# Patient Record
Sex: Female | Born: 1965 | Hispanic: Yes | Smoking: Never smoker
Health system: Southern US, Community
[De-identification: ages and names within clinical notes are randomized; demographics above are authoritative.]

## PROBLEM LIST (undated history)

## (undated) DIAGNOSIS — D649 Anemia, unspecified: Secondary | ICD-10-CM

## (undated) DIAGNOSIS — E039 Hypothyroidism, unspecified: Secondary | ICD-10-CM

## (undated) DIAGNOSIS — R7303 Prediabetes: Secondary | ICD-10-CM

## (undated) DIAGNOSIS — I10 Essential (primary) hypertension: Secondary | ICD-10-CM

## (undated) DIAGNOSIS — F419 Anxiety disorder, unspecified: Secondary | ICD-10-CM

## (undated) DIAGNOSIS — J310 Chronic rhinitis: Secondary | ICD-10-CM

## (undated) DIAGNOSIS — L309 Dermatitis, unspecified: Secondary | ICD-10-CM

## (undated) DIAGNOSIS — J45909 Unspecified asthma, uncomplicated: Secondary | ICD-10-CM

## (undated) DIAGNOSIS — F32A Depression, unspecified: Secondary | ICD-10-CM

## (undated) DIAGNOSIS — F329 Major depressive disorder, single episode, unspecified: Secondary | ICD-10-CM

## (undated) DIAGNOSIS — K3184 Gastroparesis: Secondary | ICD-10-CM

## (undated) DIAGNOSIS — F5104 Psychophysiologic insomnia: Secondary | ICD-10-CM

## (undated) DIAGNOSIS — L409 Psoriasis, unspecified: Secondary | ICD-10-CM

## (undated) DIAGNOSIS — K219 Gastro-esophageal reflux disease without esophagitis: Secondary | ICD-10-CM

## (undated) HISTORY — DX: Anemia, unspecified: D64.9

## (undated) HISTORY — DX: Psoriasis, unspecified: L40.9

## (undated) HISTORY — DX: Chronic rhinitis: J31.0

## (undated) HISTORY — DX: Psychophysiologic insomnia: F51.04

## (undated) HISTORY — DX: Unspecified asthma, uncomplicated: J45.909

## (undated) HISTORY — DX: Gastroparesis: K31.84

## (undated) HISTORY — DX: Hypothyroidism, unspecified: E03.9

## (undated) HISTORY — PX: APPENDECTOMY: SHX54

## (undated) HISTORY — PX: EYE SURGERY: SHX253

## (undated) HISTORY — DX: Anxiety disorder, unspecified: F41.9

## (undated) HISTORY — DX: Dermatitis, unspecified: L30.9

## (undated) HISTORY — DX: Gastro-esophageal reflux disease without esophagitis: K21.9

---
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
## 2008-10-21 DIAGNOSIS — I10 Essential (primary) hypertension: Secondary | ICD-10-CM | POA: Insufficient documentation

## 2008-12-17 DIAGNOSIS — IMO0001 Reserved for inherently not codable concepts without codable children: Secondary | ICD-10-CM | POA: Insufficient documentation

## 2010-03-27 ENCOUNTER — Encounter: Admission: RE | Admit: 2010-03-27 | Discharge: 2010-03-27 | Payer: Self-pay | Admitting: Neurology

## 2011-09-02 ENCOUNTER — Encounter: Payer: Self-pay | Admitting: *Deleted

## 2011-09-02 DIAGNOSIS — F329 Major depressive disorder, single episode, unspecified: Secondary | ICD-10-CM | POA: Insufficient documentation

## 2011-09-02 DIAGNOSIS — I10 Essential (primary) hypertension: Secondary | ICD-10-CM | POA: Insufficient documentation

## 2011-09-02 DIAGNOSIS — F0390 Unspecified dementia without behavioral disturbance: Secondary | ICD-10-CM | POA: Insufficient documentation

## 2011-09-02 DIAGNOSIS — F3289 Other specified depressive episodes: Secondary | ICD-10-CM | POA: Insufficient documentation

## 2011-09-02 DIAGNOSIS — R5383 Other fatigue: Secondary | ICD-10-CM | POA: Insufficient documentation

## 2011-09-02 DIAGNOSIS — R5381 Other malaise: Secondary | ICD-10-CM | POA: Insufficient documentation

## 2011-09-02 HISTORY — DX: Depression, unspecified: F32.A

## 2011-09-02 HISTORY — DX: Essential (primary) hypertension: I10

## 2014-01-07 DIAGNOSIS — T65811A Toxic effect of latex, accidental (unintentional), initial encounter: Secondary | ICD-10-CM | POA: Insufficient documentation

## 2014-01-07 DIAGNOSIS — J310 Chronic rhinitis: Secondary | ICD-10-CM | POA: Insufficient documentation

## 2014-01-09 DIAGNOSIS — F332 Major depressive disorder, recurrent severe without psychotic features: Secondary | ICD-10-CM | POA: Insufficient documentation

## 2014-03-11 DIAGNOSIS — F411 Generalized anxiety disorder: Secondary | ICD-10-CM | POA: Insufficient documentation

## 2014-03-11 DIAGNOSIS — F331 Major depressive disorder, recurrent, moderate: Secondary | ICD-10-CM | POA: Insufficient documentation

## 2014-07-07 DIAGNOSIS — R7989 Other specified abnormal findings of blood chemistry: Secondary | ICD-10-CM | POA: Insufficient documentation

## 2014-11-25 DIAGNOSIS — M5417 Radiculopathy, lumbosacral region: Secondary | ICD-10-CM | POA: Insufficient documentation

## 2014-11-25 DIAGNOSIS — M21371 Foot drop, right foot: Secondary | ICD-10-CM | POA: Insufficient documentation

## 2015-10-25 HISTORY — PX: CHOLECYSTECTOMY: SHX55

## 2016-01-06 DIAGNOSIS — M4810 Ankylosing hyperostosis [Forestier], site unspecified: Secondary | ICD-10-CM | POA: Insufficient documentation

## 2016-01-06 DIAGNOSIS — N6019 Diffuse cystic mastopathy of unspecified breast: Secondary | ICD-10-CM | POA: Insufficient documentation

## 2016-01-06 DIAGNOSIS — K5909 Other constipation: Secondary | ICD-10-CM | POA: Insufficient documentation

## 2016-01-06 DIAGNOSIS — E538 Deficiency of other specified B group vitamins: Secondary | ICD-10-CM | POA: Insufficient documentation

## 2016-01-06 DIAGNOSIS — R42 Dizziness and giddiness: Secondary | ICD-10-CM | POA: Insufficient documentation

## 2016-01-06 DIAGNOSIS — G2581 Restless legs syndrome: Secondary | ICD-10-CM | POA: Insufficient documentation

## 2016-01-06 DIAGNOSIS — B001 Herpesviral vesicular dermatitis: Secondary | ICD-10-CM | POA: Insufficient documentation

## 2016-01-06 DIAGNOSIS — E559 Vitamin D deficiency, unspecified: Secondary | ICD-10-CM | POA: Insufficient documentation

## 2016-01-06 DIAGNOSIS — G43009 Migraine without aura, not intractable, without status migrainosus: Secondary | ICD-10-CM | POA: Insufficient documentation

## 2016-01-06 DIAGNOSIS — F410 Panic disorder [episodic paroxysmal anxiety] without agoraphobia: Secondary | ICD-10-CM | POA: Insufficient documentation

## 2016-05-13 DIAGNOSIS — Z9049 Acquired absence of other specified parts of digestive tract: Secondary | ICD-10-CM | POA: Insufficient documentation

## 2017-10-10 DIAGNOSIS — R21 Rash and other nonspecific skin eruption: Secondary | ICD-10-CM | POA: Insufficient documentation

## 2018-10-24 DIAGNOSIS — F419 Anxiety disorder, unspecified: Secondary | ICD-10-CM | POA: Insufficient documentation

## 2019-03-28 DIAGNOSIS — L501 Idiopathic urticaria: Secondary | ICD-10-CM | POA: Insufficient documentation

## 2019-09-09 ENCOUNTER — Encounter: Payer: Self-pay | Admitting: Family Medicine

## 2019-09-09 ENCOUNTER — Other Ambulatory Visit: Payer: Self-pay

## 2019-09-09 ENCOUNTER — Ambulatory Visit (INDEPENDENT_AMBULATORY_CARE_PROVIDER_SITE_OTHER): Payer: Self-pay | Admitting: Family Medicine

## 2019-09-09 VITALS — BP 132/74 | HR 74 | Ht 60.5 in | Wt 124.0 lb

## 2019-09-09 DIAGNOSIS — F329 Major depressive disorder, single episode, unspecified: Secondary | ICD-10-CM | POA: Insufficient documentation

## 2019-09-09 DIAGNOSIS — E039 Hypothyroidism, unspecified: Secondary | ICD-10-CM

## 2019-09-09 DIAGNOSIS — K3184 Gastroparesis: Secondary | ICD-10-CM | POA: Insufficient documentation

## 2019-09-09 DIAGNOSIS — F32A Depression, unspecified: Secondary | ICD-10-CM | POA: Insufficient documentation

## 2019-09-09 DIAGNOSIS — F419 Anxiety disorder, unspecified: Secondary | ICD-10-CM

## 2019-09-09 NOTE — Progress Notes (Signed)
   Subjective:    Patient ID: Meredith [name], female    DOB: 09-09-1966, 53 y.o.   MRN: [id number]   CC: Meredith [name] is a 53 year old female who presents to establish care with PCP  HPI:  76 of this encounter was spent reviewing patient's medication list on epic.  Current concerns include:  Gastroparesis Patient has recently moved from [facility] and does not have insurance, is waiting for orange card approval.  Has suffered from gastroparesis since 2006. Her main symptoms include nausea which is well controlled at the moment.  She is able to tolerate p.o. and denies weight loss, vomiting or hematemesis. Was seeing Dr. Scherry Ran (last visit was in August) in [place], but now would like to be transferred to a GI specialist in [place].   Smoking status reviewed   ROS: pertinent noted in the HPI   The new patient health history form was reviewed with the patient and updated on epic.   Objective:  BP 132/74   Pulse 74   Ht 5' 0.5" (1.537 m)   Wt 124 lb (56.2 kg)   SpO2 99%   BMI 23.82 kg/m   Vitals and nursing note reviewed  General: NAD, pleasant, able to participate in exam Cardiac: RRR, S1 S2 present. normal heart sounds, no murmurs. Respiratory: CTAB, normal effort, No wheezes, rales or rhonchi GI: Abdomen soft nontender, no organomegaly, hypoactive bowel sounds Extremities: no edema or cyanosis. Skin: warm and dry, no rashes noted Neuro: alert, no obvious focal deficits Psych: Normal affect and mood  PHQ: 2  Assessment & Plan:    Gastroparesis Pt's has longstanding gastroparesis, has seen GI specialist in August this year and her symptoms are stable. Continue anti-emetics per GI. Will await Orange card approval and then refer to GI in [place] per patients requests.   Lattie Haw, MD  [facility] PGY-1

## 2019-09-09 NOTE — Patient Instructions (Addendum)
Ms Dose,  It was lovely to meet you today, I am very pleased to be your new PCP! Today we reviewed your medication list I have updated this on our system. We also talked about your gastroparesis symptoms, it seems like the symptoms have been well controlled recently and you saw your GI doctor earlier this year in August.  Please come to see me again in the next few weeks or months and I would be happy to discuss the other medical conditions in more detail.  If in the meantime if you have any additional questions then please do not hesitate to contact our clinic.  We will forward to seeing you again.    Best wishes,  Dr. Posey Pronto

## 2019-09-09 NOTE — Assessment & Plan Note (Signed)
Pt's has longstanding gastroparesis, has seen GI specialist in August this year and her symptoms are stable. Continue anti-emetics per GI. Will await Orange card approval and then refer to GI in [place] per patients requests.

## 2019-10-15 ENCOUNTER — Other Ambulatory Visit: Payer: Self-pay

## 2019-10-15 ENCOUNTER — Ambulatory Visit (INDEPENDENT_AMBULATORY_CARE_PROVIDER_SITE_OTHER): Payer: Self-pay | Admitting: Family Medicine

## 2019-10-15 ENCOUNTER — Ambulatory Visit (HOSPITAL_COMMUNITY)
Admission: RE | Admit: 2019-10-15 | Discharge: 2019-10-15 | Disposition: A | Discharge status: Home / Self Care | Payer: Medicaid Other | Source: Ambulatory Visit | Attending: Family Medicine | Admitting: Family Medicine

## 2019-10-15 ENCOUNTER — Ambulatory Visit (HOSPITAL_COMMUNITY): Payer: Medicaid Other

## 2019-10-15 ENCOUNTER — Encounter: Payer: Self-pay | Admitting: Family Medicine

## 2019-10-15 VITALS — BP 124/78 | HR 77 | Wt 125.0 lb

## 2019-10-15 DIAGNOSIS — E039 Hypothyroidism, unspecified: Secondary | ICD-10-CM

## 2019-10-15 DIAGNOSIS — Z23 Encounter for immunization: Secondary | ICD-10-CM

## 2019-10-15 DIAGNOSIS — M549 Dorsalgia, unspecified: Secondary | ICD-10-CM

## 2019-10-15 DIAGNOSIS — G8929 Other chronic pain: Secondary | ICD-10-CM

## 2019-10-15 DIAGNOSIS — I10 Essential (primary) hypertension: Secondary | ICD-10-CM

## 2019-10-15 DIAGNOSIS — R079 Chest pain, unspecified: Secondary | ICD-10-CM | POA: Insufficient documentation

## 2019-10-15 DIAGNOSIS — K3184 Gastroparesis: Secondary | ICD-10-CM

## 2019-10-15 NOTE — Patient Instructions (Signed)
Ms Huver,  It was lovely to meet you today.  I have referred you to the GI specialist gastroparesis can have referred you to physical therapy for your back pain.  I am checking your thyroid levels today to see if they are in normal range.  I will be in touch with you regarding these results.  Please, see me in 1 month's time for a follow-up and to discuss other medical issues that we did not discuss today.  Wish you the best and happy holidays,  Dr. Posey Pronto

## 2019-10-15 NOTE — Progress Notes (Signed)
   Subjective:    Patient ID: Meredith [name], female    DOB: [birth year], 53 y.o.   MRN: [id number]   CC:  Meredith [name] is a 53 yr old female who presents today for follow up. Partner [name] was present.  HPI:  Pt had multiple issues she wanted to discuss today. I recommended in order for me to provide the best care for her, that we discussed only 2-3 problems today and she saw me a for another visit in Jan 2021. Dentist, eye doctor, wants flu shot  Gastroparesis, IBS Had had symptoms of gastroparesis for many years. Mains symptoms are nausea and vomiting sometimes few times a week. Often wakes up with nausea. Has now become afraid to eat and often feels like regurgitating her food. Denies hematemesis. Was formally dx in 2018 by Dr Delle Reining in [place]. Has taken Meclizine, Dexilant, Regland and Zofran in the past for symptoms. Unsure which medications she is taking for what. Has polypharmacy and is confused with her medications. Deniesweight loss but has significant loss of appetite.  Chronic back pain  Has had chronic back pain for 10 yrs. It is a sharp, constant,n which sometimes feels like an "electrical sensation". Located at the cervical region, radiating to arms and lower spine and has  "numbness and tingling".  No urinary/ incontinence, no change in sensation. No fevers.   Smoking status reviewed   ROS: pertinent noted in the HPI    Past medical history, surgical, family, and social history reviewed and updated in the EMR as appropriate. Reviewed problem list.   Objective:  BP 124/78   Pulse 77   Wt 125 lb (56.7 kg)   SpO2 98%   BMI 24.01 kg/m   Vitals and nursing note reviewed  General: NAD, pleasant, able to participate in exam Cardiac: RRR, S1 S2 present. normal heart sounds, no murmurs. Respiratory: CTAB, normal effort, No wheezes, rales or rhonchi GI: abdo soft, epigastric tenderness, no guarding, bowel sounds present Extremities: no edema or cyanosis. Skin: warm and  dry, no rashes noted Neuro: alert, no obvious focal deficits Psych: Normal affect and mood MSK: no c-spine tenderness. Normal flexion, extension of spine   Assessment & Plan:    Gastroparesis Likely poorly controlled gastroparesis. Pt needs medical optimization. EKG-normal QT so prescribed Zofran PRN. Ambulatory referral to GI.  Chronic back pain Likely degenerative etiology. Patient open to physical therapy so will make ambulatory referral to PT. Recommend tylenol and Kpads. If these therapies do not help, then will consider imaging.    Lattie Haw, MD  [facility] PGY-1

## 2019-10-16 ENCOUNTER — Other Ambulatory Visit: Payer: Self-pay | Admitting: Family Medicine

## 2019-10-16 ENCOUNTER — Telehealth: Payer: Self-pay | Admitting: Family Medicine

## 2019-10-16 LAB — TSH: TSH: 0.271 u[IU]/mL — ABNORMAL LOW (ref 0.450–4.500)

## 2019-10-16 MED ORDER — LEVOTHYROXINE SODIUM 125 MCG PO TABS: 125.0000 ug | ORAL_TABLET | ORAL | 3 refills | Status: DC

## 2019-10-16 MED ORDER — ONDANSETRON HCL 4 MG PO TABS: 4.0000 mg | ORAL_TABLET | Freq: Three times a day (TID) | ORAL | 0 refills | Status: DC | PRN

## 2019-10-16 NOTE — Telephone Encounter (Signed)
I have called the pt back and resolved this issue. Thanks.

## 2019-10-16 NOTE — Telephone Encounter (Signed)
Called pt to inform her of her TSH results and that dose of Synthroid needs to be changed. Also called to discuss whether partner was able to get Reglan for her from [facility]. Went to voicemail so will try again later.

## 2019-10-16 NOTE — Telephone Encounter (Signed)
Partner called back and LM on nurse line.   States that they will need a refill on levothyroxine and that pt was taken off of reglan because it cause urticaria.   To PCP. Christen Bame, CMA

## 2019-10-18 DIAGNOSIS — E039 Hypothyroidism, unspecified: Secondary | ICD-10-CM | POA: Insufficient documentation

## 2019-10-18 DIAGNOSIS — G8929 Other chronic pain: Secondary | ICD-10-CM | POA: Insufficient documentation

## 2019-10-18 DIAGNOSIS — M549 Dorsalgia, unspecified: Secondary | ICD-10-CM | POA: Insufficient documentation

## 2019-10-18 NOTE — Assessment & Plan Note (Signed)
Likely poorly controlled gastroparesis. Pt needs medical optimization. EKG-normal QT so prescribed Zofran PRN. Ambulatory referral to GI.

## 2019-10-18 NOTE — Assessment & Plan Note (Addendum)
Likely degenerative etiology. Patient open to physical therapy so will make ambulatory referral to PT. Recommend tylenol and Kpads. If these therapies do not help, then will consider imaging.

## 2019-10-21 ENCOUNTER — Telehealth: Payer: Self-pay | Admitting: Family Medicine

## 2019-10-21 ENCOUNTER — Ambulatory Visit: Payer: Medicaid Other | Attending: Internal Medicine

## 2019-10-21 ENCOUNTER — Encounter: Payer: Self-pay | Admitting: Family Medicine

## 2019-10-21 ENCOUNTER — Ambulatory Visit: Payer: Medicaid Other | Admitting: Family Medicine

## 2019-10-21 DIAGNOSIS — Z20828 Contact with and (suspected) exposure to other viral communicable diseases: Secondary | ICD-10-CM | POA: Insufficient documentation

## 2019-10-21 DIAGNOSIS — Z1384 Encounter for screening for dental disorders: Secondary | ICD-10-CM

## 2019-10-21 DIAGNOSIS — Z20822 Contact with and (suspected) exposure to covid-19: Secondary | ICD-10-CM

## 2019-10-23 LAB — NOVEL CORONAVIRUS, NAA: SARS-CoV-2, NAA: NOT DETECTED

## 2019-10-28 ENCOUNTER — Ambulatory Visit: Payer: Medicaid Other | Attending: Internal Medicine

## 2019-10-28 DIAGNOSIS — Z20822 Contact with and (suspected) exposure to covid-19: Secondary | ICD-10-CM

## 2019-10-28 DIAGNOSIS — U071 COVID-19: Secondary | ICD-10-CM | POA: Insufficient documentation

## 2019-10-29 ENCOUNTER — Other Ambulatory Visit: Payer: Self-pay

## 2019-10-29 ENCOUNTER — Telehealth (INDEPENDENT_AMBULATORY_CARE_PROVIDER_SITE_OTHER): Payer: Self-pay | Admitting: Family Medicine

## 2019-10-29 ENCOUNTER — Telehealth: Payer: Medicaid Other | Admitting: Family Medicine

## 2019-10-29 DIAGNOSIS — M797 Fibromyalgia: Secondary | ICD-10-CM | POA: Insufficient documentation

## 2019-10-29 NOTE — Assessment & Plan Note (Signed)
Patient reports history of fibromyalgia for "many years".  She says that she has muscle aches that have been worse over the past few days and she has been unable to get out of bed.  She takes over-the-counter Tylenol with little relief.  She reports that she has also tried ice packs as well as heating packs and over-the-counter gels with minimal to no relief.  Patient says that she is scheduled to meet with a physical therapist but has not yet had that appointment. -Continue over-the-counter measures at this time -Continue with heating pad and ice packs as needed -Attend physical therapy assessment and treatment. -Follow-up for further management

## 2019-10-29 NOTE — Progress Notes (Signed)
[facility] Telemedicine Visit  Patient consented to have virtual visit. Method of visit: Video  Encounter participants: Patient: Meredith [name] - located at Home  Provider: Gifford Shave - located at home    Chief Complaint: Chronic pain    HPI: Patient had in person appointment scheduled but had COVID test pending because of exposure so she canceled her in person appointment and continued with virtual.  Fibromyalgia Muscles are aching, worse for past few days. OTC tylenol with little relief. Has tried heating pads and ice packs with little relief. Has also tried OTC gels. Is scheduled for physical therapy but has participated yet.   Dermatitis  Patient reports dermatitis on both legs which she is concerned about. Says it has been there for over a year. Reports that that it has remained warm to touch. It is very difficult to see on video. Denies fever.     Patient reports she is extremely forgetful and cannot remember when she requested this appointment.  Throughout the discussion patient is tangential.  She repeatedly requests further testing regarding blood work.  She asks about her white count as well as her cholesterol.  Given multiple complaints and questions which she requests tests and lab work I scheduled a appointment for her on 11/04/2019.  ROS: per HPI  Pertinent PMHx: Chronic pain   Exam: General: Patient is resting comfortably via video chat.  She looks in no acute distress.  She appears confused at times.  She is concerned about her lower extremity "dermatitis". Respiratory: Speaking clearly, no shortness of breath MSK: Patient insistent on showing lower extremities bilaterally.  She says that her distal lower extremities are erythematous pad have dermatitis.  Very difficult to discern any rash via video chat.  Pictures grainy and not clear.  Recommend further evaluation in person   Assessment/Plan:  Fibromyalgia Patient reports history of  fibromyalgia for "many years".  She says that she has muscle aches that have been worse over the past few days and she has been unable to get out of bed.  She takes over-the-counter Tylenol with little relief.  She reports that she has also tried ice packs as well as heating packs and over-the-counter gels with minimal to no relief.  Patient says that she is scheduled to meet with a physical therapist but has not yet had that appointment. -Continue over-the-counter measures at this time -Continue with heating pad and ice packs as needed -Attend physical therapy assessment and treatment. -Follow-up for further management     Lower extremity edema Patient reports that she has had worsening lower extremity edema erythema and "dermatitis".  This is on bilateral lower extremities.  She reports that she is had issues with this for "years" but feels like it is just getting worse.  The video telemedicine visit was very blurry and difficult to see or appreciate any erythema or dermatitis in the lower extremities. -Patient has follow-up appointment scheduled on 11/04/2019 and recommend evaluation at that time. -Monitor for worsening symptoms  Health maintenance Patient is requesting routine lab work.  She wants to know her white blood cell count she also would like to know her cholesterol levels.  This require clinic visit.  She is scheduled for an appointment on 11/04/2019.

## 2019-10-30 LAB — NOVEL CORONAVIRUS, NAA: SARS-CoV-2, NAA: DETECTED — AB

## 2019-10-31 ENCOUNTER — Telehealth: Payer: Self-pay | Admitting: Nurse Practitioner

## 2019-10-31 ENCOUNTER — Ambulatory Visit: Payer: Medicaid Other | Admitting: Gastroenterology

## 2019-10-31 NOTE — Telephone Encounter (Signed)
Called to Discuss with patient about Covid symptoms and the use of bamlanivimab, a monoclonal antibody infusion for those with mild to moderate Covid symptoms and at a high risk of hospitalization.     Husband states that patient is doing well. Her symptoms started 10 days ago so she does not meet criteria for infusion.

## 2019-11-04 ENCOUNTER — Encounter: Payer: Self-pay | Admitting: Family Medicine

## 2019-11-04 ENCOUNTER — Other Ambulatory Visit: Payer: Self-pay

## 2019-11-04 ENCOUNTER — Ambulatory Visit (INDEPENDENT_AMBULATORY_CARE_PROVIDER_SITE_OTHER): Payer: Self-pay | Admitting: Family Medicine

## 2019-11-04 VITALS — BP 105/60 | HR 71 | Temp 97.3°F | Wt 124.0 lb

## 2019-11-04 DIAGNOSIS — R21 Rash and other nonspecific skin eruption: Secondary | ICD-10-CM

## 2019-11-04 DIAGNOSIS — M797 Fibromyalgia: Secondary | ICD-10-CM

## 2019-11-04 NOTE — Patient Instructions (Signed)
Meredith [name],  It was lovely to see you today.  I am sorry that you are feeling very depressed because of your severe chronic pain.  I referred you to the transition of care team who will be able to deal with a lot of your chronic concerns and put you in touch with members of the community who will be able to help further.  I will refer you to a dermatologist for the rash on your leg rashes.  Please come see me next week so that we can go through your medications in detail and discontinue the ones that you do not need. Please see the other sheet for crises hotline phone numbers. Please do not hesitate to contact me if you have any other concerns and I be happy to speak to you.  I look forward to seeing you next week  Kind regards, Dr. Posey Pronto

## 2019-11-04 NOTE — Progress Notes (Signed)
   Subjective:    Patient ID: Meredith [name], female    DOB: 20-Jan-1966, 54 y.o.   MRN: [id number]   CC:  Meredith [name] is a 54 yr old female who presents today for fibromyalgia symptoms   HPI:  Fibromyalgia and chronic pain Patient was tearful at during this encounter. Partner Meredith [name] provided assistance with her over the phone. Patient reports fibromyalgia pain is making her very depressed and she does not know what to do. She has suffered the symptoms all over body for many years and is now very frustrated. Patient recently had Covid which exacerbated her chronic pain symptoms. She is due to see physical therapy this later this week. Denies thoughts of self-harm, harm to others or suicide. However did circle #9 as "nearly every day" on PHQ-9 depression scale. When I asked her why she had circled this she reported that it was more out of desperation for help as she is worried that she has so many chronic conditions that are limiting her quality of life and that. Meredith [name] reiterated that she was not having active suicidal thoughts.   Rash At the end of the encounter patient wanted to discuss a erythematous and pruritic chronic rash present on her lower extremities and over her posterior neck. Unfortunately I was unable to review this at this time. Partner Meredith [name] asked for patient to be referred to dermatology.  Smoking status reviewed   ROS: pertinent noted in the HPI    Past medical history, surgical, family, and social history reviewed and updated in the EMR as appropriate. Reviewed problem list.   Objective:  BP 105/60   Pulse 71   Temp (!) 97.3 F (36.3 C) (Oral)   Wt 124 lb (56.2 kg)   SpO2 97%   BMI 23.82 kg/m   Vitals and nursing note reviewed  General: Tearful, tired appearing Cardiac: Warm and well-perfused Respiratory: No tachypnea Extremities: no edema or cyanosis. Skin: Approx oval shaped 12cm x 6cm, erythematous, blanching rash noted on lateral surfaces of lower  extremities. Similar smaller rash noted on left side of posterior neck.  Neuro: alert, no obvious focal deficits Psych: Good mood and affect   Assessment & Plan:    Fibromyalgia Poorly controlled chronic pain from Fibromyalgia. Considered switching to different antidepressant today, however pt has polypharmacy and she is unclear which medications she is taking for which medical condition, would rather bring pt in for medication review next week and then start new medications then. Recommended to continue with PT for now and to do gentle exercises on a daily basis which can help in fibromyalgia. Considered myositis as causes of pains so will check CK today. Precepted pt with Dr Meredith [name] and Dr Meredith [name] who recommended CCM.   Meredith Haw, MD  [facility] PGY-1

## 2019-11-05 LAB — COMPREHENSIVE METABOLIC PANEL
ALT: 33 IU/L — ABNORMAL HIGH (ref 0–32)
AST: 42 IU/L — ABNORMAL HIGH (ref 0–40)
Albumin/Globulin Ratio: 1.8 (ref 1.2–2.2)
Albumin: 4.9 g/dL (ref 3.8–4.9)
Alkaline Phosphatase: 128 IU/L — ABNORMAL HIGH (ref 39–117)
BUN/Creatinine Ratio: 19 (ref 9–23)
BUN: 10 mg/dL (ref 6–24)
Bilirubin Total: 0.7 mg/dL (ref 0.0–1.2)
CO2: 26 mmol/L (ref 20–29)
Calcium: 10.1 mg/dL (ref 8.7–10.2)
Chloride: 98 mmol/L (ref 96–106)
Creatinine, Ser: 0.53 mg/dL — ABNORMAL LOW (ref 0.57–1.00)
GFR calc Af Amer: 124 mL/min/{1.73_m2} (ref >59)
GFR calc non Af Amer: 107 mL/min/{1.73_m2} (ref >59)
Globulin, Total: 2.8 g/dL (ref 1.5–4.5)
Glucose: 132 mg/dL — ABNORMAL HIGH (ref 65–99)
Potassium: 4 mmol/L (ref 3.5–5.2)
Sodium: 138 mmol/L (ref 134–144)
Total Protein: 7.7 g/dL (ref 6.0–8.5)

## 2019-11-05 LAB — CBC
Hematocrit: 43.2 % (ref 34.0–46.6)
Hemoglobin: 14.7 g/dL (ref 11.1–15.9)
MCH: 28.2 pg (ref 26.6–33.0)
MCHC: 34 g/dL (ref 31.5–35.7)
MCV: 83 fL (ref 79–97)
Platelets: 567 10*3/uL — ABNORMAL HIGH (ref 150–450)
RBC: 5.22 x10E6/uL (ref 3.77–5.28)
RDW: 12.8 % (ref 11.7–15.4)
WBC: 8.6 10*3/uL (ref 3.4–10.8)

## 2019-11-05 LAB — CK: Total CK: 64 U/L (ref 32–182)

## 2019-11-06 ENCOUNTER — Ambulatory Visit: Payer: Self-pay

## 2019-11-06 ENCOUNTER — Encounter: Payer: Self-pay | Admitting: Family Medicine

## 2019-11-07 NOTE — Assessment & Plan Note (Addendum)
Poorly controlled chronic pain from Fibromyalgia. Considered switching to different antidepressant today, however pt has polypharmacy and she is unclear which medications she is taking for which medical condition, would rather bring pt in for medication review next week and then start new medications then. Recommended to continue with PT for now and to do gentle exercises on a daily basis which can help in fibromyalgia. Considered myositis as causes of pains so will check CK today. Precepted pt with Dr Erin Hearing and Dr Hartford Poli who recommended CCM.

## 2019-11-08 ENCOUNTER — Telehealth: Payer: Self-pay | Admitting: Family Medicine

## 2019-11-08 NOTE — Telephone Encounter (Signed)
Called pt and left VM to inform her of her lab results. She will need repeat CMP and CBC next as liver function test and platelets were elevated.

## 2019-11-08 NOTE — Chronic Care Management (AMB) (Signed)
  Chronic Care Management   Outreach Note  11/08/2019 Name: [name] MRN: [id number] DOB: 21-Apr-1966  Meredith [name] is a 54 y.o. year old female who is a primary care patient of Lattie Haw, MD. I reached out to Agnes Lawrence by phone today in response to a referral sent by Ms. Toya Smothers Mccaster's PCP, Dr. Lattie Haw     An unsuccessful telephone outreach was attempted today. The patient was referred to the case management team by for assistance with care management and care coordination.   Follow Up Plan: A HIPPA compliant phone message was left for the patient providing contact information and requesting a return call.  The care management team will reach out to the patient again over the next 7 days.  If patient returns call to provider office, please advise to call [facility]edded Care Management Care Guide Glenna Durand LPN at [phone]  Nickeah Allen, LPN Health Advisor, [facility] ??[email or web address] ??[phone]

## 2019-11-11 ENCOUNTER — Ambulatory Visit: Payer: Medicaid Other | Admitting: Family Medicine

## 2019-11-13 ENCOUNTER — Encounter: Payer: Self-pay | Admitting: Family Medicine

## 2019-11-13 ENCOUNTER — Ambulatory Visit (INDEPENDENT_AMBULATORY_CARE_PROVIDER_SITE_OTHER): Payer: Self-pay | Admitting: Family Medicine

## 2019-11-13 ENCOUNTER — Ambulatory Visit: Payer: Self-pay | Admitting: Licensed Clinical Social Worker

## 2019-11-13 ENCOUNTER — Other Ambulatory Visit: Payer: Self-pay

## 2019-11-13 VITALS — BP 124/82 | HR 78 | Wt 125.0 lb

## 2019-11-13 DIAGNOSIS — D473 Essential (hemorrhagic) thrombocythemia: Secondary | ICD-10-CM

## 2019-11-13 DIAGNOSIS — Z7189 Other specified counseling: Secondary | ICD-10-CM | POA: Insufficient documentation

## 2019-11-13 DIAGNOSIS — R899 Unspecified abnormal finding in specimens from other organs, systems and tissues: Secondary | ICD-10-CM

## 2019-11-13 DIAGNOSIS — R7989 Other specified abnormal findings of blood chemistry: Secondary | ICD-10-CM | POA: Insufficient documentation

## 2019-11-13 DIAGNOSIS — Z862 Personal history of diseases of the blood and blood-forming organs and certain disorders involving the immune mechanism: Secondary | ICD-10-CM

## 2019-11-13 DIAGNOSIS — Z114 Encounter for screening for human immunodeficiency virus [HIV]: Secondary | ICD-10-CM

## 2019-11-13 DIAGNOSIS — R945 Abnormal results of liver function studies: Secondary | ICD-10-CM

## 2019-11-13 DIAGNOSIS — D75839 Thrombocytosis, unspecified: Secondary | ICD-10-CM | POA: Insufficient documentation

## 2019-11-13 DIAGNOSIS — Z1159 Encounter for screening for other viral diseases: Secondary | ICD-10-CM

## 2019-11-13 MED ORDER — ZOLPIDEM TARTRATE ER 12.5 MG PO TBCR: 12.5000 mg | EXTENDED_RELEASE_TABLET | Freq: Every evening | ORAL | 0 refills | Status: DC | PRN

## 2019-11-13 MED ORDER — ATENOLOL 100 MG PO TABS: 100.0000 mg | ORAL_TABLET | Freq: Every day | ORAL | 0 refills | Status: DC

## 2019-11-13 NOTE — Assessment & Plan Note (Signed)
Platelets 567 on recent CBC. Pt has hx of anemia however Hb 14. May be reactive thrombocytosis. Will repeat CBC today to evaluate trend.

## 2019-11-13 NOTE — Assessment & Plan Note (Signed)
LFTs (ALP, ALT and AST) slightly elevated on recent CMP. May be due to fatty liver disease which pt as. Will repeat CMP and also routine Hep C screening today.

## 2019-11-13 NOTE — Chronic Care Management (AMB) (Signed)
    Clinical Social Work  Care Management Outreach Note  11/13/2019 Name: Meredith [name] MRN: [id number] DOB: [birth year]  Meredith [name] is a 54 y.o. year old female who is a primary care patient of Lattie Haw, MD . The Care Management team was consulted for assistance with connecting patient to [facility] that provide CBT and Resources.   LCSW reached out to Agnes Lawrence today by phone to introduce and offer Care Management support. Patient was in with PCP. Partner answered phone. Follow Up Plan: LCSW will call patient tomorrow.  Casimer Lanius, LCSW Clinical Social Worker [facility]   [phone] 2:14 PM

## 2019-11-13 NOTE — Assessment & Plan Note (Addendum)
We spoke through each medication Ms Wecker had had at home. I discontinued: potassium tablets, symbicort, zofran, hydroxyzine, doxepin, vitamin D, vitamin C and desonide cream. Pt will dispose of these medications. Explained to pt the risk of polypharmacy and also recommended she keep note of all the medications she is taking and why and the importance of this. Partner [name] is very supportive and will help her. He is usually present at most clinic visits. Unsure why she is taking Atenolol which was started by previous PCP, possibly for HTN. Pt will call and find out and let me know.   I have refilled Atenolol for now and also refilled Zolpidem 30 tablets only. Pt is worried about getting addicted to this medication and cannot sleep without it. I said this is something we can discuss at our next visit and we can do this virtually if pt prefers.

## 2019-11-13 NOTE — Patient Instructions (Signed)
Ms Wi,  It was lovely to meet you again today.Today we went over each of your medications and went over why you taking each of them. I hope your medications are more clear now. Please ask your previous PCP why you were taking atenolol and let me know. Please follow up with the transition of care team. Good luck for your appointments with the dermatologist and GI doctor. I will call you and let you know your lab results if they are abnormal.  Take care and best wishes,  Dr Posey Pronto

## 2019-11-13 NOTE — Progress Notes (Signed)
   Subjective:    Patient ID: Meredith [name], female    DOB: 04/30/1966, 55 y.o.   MRN: [id number]   CC: Meredith [name] is a 54 yr old female who presents today for a medication review. Partner Meredith [name] is also present.  HPI:  Medication review  Pt has polypharmacy.Taking many medications from other previous providers but unsure which ones she is taking and why.   Smoking status reviewed   ROS: pertinent noted in the HPI    Past medical history, surgical, family, and social history reviewed and updated in the EMR as appropriate. Reviewed problem list.   Objective:  BP 124/82   Pulse 78   Wt 125 lb (56.7 kg)   SpO2 96%   BMI 24.01 kg/m   Vitals and nursing note reviewed  General: NAD, pleasant Cardiac:  Well perfused  Respiratory: no respiratory distress Neuro: alert, no obvious focal deficits Psych: Normal affect and mood   Assessment & Plan:    Encounter for medication review and counseling We spoke through each medication Meredith [name] had had at home. I discontinued: potassium tablets, symbicort, zofran, hydroxyzine, doxepin, vitamin D, vitamin C and desonide cream. Explained to pt the risk of polypharmacy and also recommended she keep note of all the medications she is taking and why and the importance of this. Partner Meredith [name] is very supportive and will help her. He is usually present at most clinic visits. Unsure why she is taking Atenolol which was started by previous PCP, possibly for HTN. Pt will call and find out and let me know.   I have refilled Atenolol for now and also refilled Zolpidem 30 tablets only. Pt is worried about getting addicted to this medication and cannot sleep without it. I said this is something we can discuss at our next visit and we can do this virtually if pt prefers.   Abnormal LFTs LFTs (ALP, ALT and AST) slightly elevated on recent CMP. May be due to fatty liver disease which pt as. Will repeat CMP and also routine Hep C screening today.    Thrombocytosis ([facility]) Platelets 567 on recent CBC. Pt has hx of anemia however Hb 14. May be reactive thrombocytosis. Will repeat CBC today to evaluate trend.   Lattie Haw, MD  [facility] PGY-1

## 2019-11-14 ENCOUNTER — Ambulatory Visit: Payer: Medicaid Other | Admitting: Licensed Clinical Social Worker

## 2019-11-14 DIAGNOSIS — M549 Dorsalgia, unspecified: Secondary | ICD-10-CM

## 2019-11-14 DIAGNOSIS — F419 Anxiety disorder, unspecified: Secondary | ICD-10-CM

## 2019-11-14 DIAGNOSIS — G8929 Other chronic pain: Secondary | ICD-10-CM

## 2019-11-14 DIAGNOSIS — Z7189 Other specified counseling: Secondary | ICD-10-CM

## 2019-11-14 LAB — COMPREHENSIVE METABOLIC PANEL
ALT: 73 IU/L — ABNORMAL HIGH (ref 0–32)
AST: 84 IU/L — ABNORMAL HIGH (ref 0–40)
Albumin/Globulin Ratio: 1.8 (ref 1.2–2.2)
Albumin: 5.1 g/dL — ABNORMAL HIGH (ref 3.8–4.9)
Alkaline Phosphatase: 125 IU/L — ABNORMAL HIGH (ref 39–117)
BUN/Creatinine Ratio: 8 — ABNORMAL LOW (ref 9–23)
BUN: 5 mg/dL — ABNORMAL LOW (ref 6–24)
Bilirubin Total: 0.8 mg/dL (ref 0.0–1.2)
CO2: 23 mmol/L (ref 20–29)
Calcium: 10.4 mg/dL — ABNORMAL HIGH (ref 8.7–10.2)
Chloride: 98 mmol/L (ref 96–106)
Creatinine, Ser: 0.6 mg/dL (ref 0.57–1.00)
GFR calc Af Amer: 119 mL/min/{1.73_m2} (ref >59)
GFR calc non Af Amer: 103 mL/min/{1.73_m2} (ref >59)
Globulin, Total: 2.8 g/dL (ref 1.5–4.5)
Glucose: 201 mg/dL — ABNORMAL HIGH (ref 65–99)
Potassium: 4.3 mmol/L (ref 3.5–5.2)
Sodium: 141 mmol/L (ref 134–144)
Total Protein: 7.9 g/dL (ref 6.0–8.5)

## 2019-11-14 LAB — CBC
Hematocrit: 44.3 % (ref 34.0–46.6)
Hemoglobin: 14.8 g/dL (ref 11.1–15.9)
MCH: 28.1 pg (ref 26.6–33.0)
MCHC: 33.4 g/dL (ref 31.5–35.7)
MCV: 84 fL (ref 79–97)
Platelets: 401 10*3/uL (ref 150–450)
RBC: 5.27 x10E6/uL (ref 3.77–5.28)
RDW: 13.1 % (ref 11.7–15.4)
WBC: 6.5 10*3/uL (ref 3.4–10.8)

## 2019-11-14 LAB — IRON,TIBC AND FERRITIN PANEL
Ferritin: 555 ng/mL — ABNORMAL HIGH (ref 15–150)
Iron Saturation: 29 % (ref 15–55)
Iron: 86 ug/dL (ref 27–159)
Total Iron Binding Capacity: 297 ug/dL (ref 250–450)
UIBC: 211 ug/dL (ref 131–425)

## 2019-11-14 LAB — HEPATITIS C ANTIBODY: Hep C Virus Ab: <0.1 s/co ratio (ref 0.0–0.9)

## 2019-11-14 LAB — HIV ANTIBODY (ROUTINE TESTING W REFLEX): HIV Screen 4th Generation wRfx: NONREACTIVE

## 2019-11-14 NOTE — Chronic Care Management (AMB) (Signed)
Care Management   Clinical Social Work Consultation   11/14/2019 Name: Meredith [name] MRN: [id number] DOB: 1966/10/09  Meredith [name] is a 54 y.o. year old female who is a primary care patient of Lattie Haw, MD . LCSW was consulted for assistance with Mental Health Counseling and Resources.   LCSW reached out to Agnes Lawrence today by phone to introduce self, assess needs for all CCM services and provide intervention.  Assessment: Patient denies needing assistance from CCM team for ongoing support and interventions with pharmacy and RN care manager. She is connected with the MAP program for all medications, has appointments schedules with specialist, has orange card, and has applied for social security disability. Strengths:Ability for insight Active sense of humor Communication skills Motivation for treatment/growth Supportive family/friends  SDOH (Social Determinants of Health) screening performed : challenges identified: None Goals Addressed            This Visit's Progress   . Advance Directive       Current Barriers:  . Patient with Anxiety and Depression  needs assistance with advance directives . Limited education about the importance of naming a healthcare power of attorney  Clinical Social Work Clinical Goal(s):  Marland Kitchen Over the next 20 days, the patient will review mailed EMMI education on Advance Directive . Over the next 30 days, patient will verbalize basic understanding of Advanced Directives and importance of completion . Over the next 30 days, the patient will complete mailed Advance Directive packet  . Over the next 45 days, the patient will have Advance Directive notarized and provide a copy to provider office  Interventions provided by LCSW: . Mailed the patient an EMMI educational handout on Advance Directives as well as an Emergency planning/management officer . Advised patient to review information mailed by this SW . Provided education and assistance to client regarding  Advanced Directives. . A voluntary discussion about advanced care planning including explanation and discussion of advanced directives was extentively discussed with the patient and friend today.   . Explanation regarding healthcare proxy and living will was reviewed and packet with forms with expiration of how to fill them out was mailed.    Patient Self Care Activities:  . Is able to complete documentation independently . Can identify next of kin, power or attorney,  . Patient will review packet and bring back to office during next appointment  Initial goal documentation     . Connect with counseling       Current Barriers:  . Chronic Mental Health needs related to depression  . Lacks knowledge of community resource: with no insurance  Clinical Social Work [employer]):  Marland Kitchen Over the next 10 days, patient will set up counseling appointment to address needs related to ongoing therapy.  Interventions: . Patient interviewed and appropriate assessments performed:  . Provided patient with information on agencies that take [employer] (family services of the [place], [place] and Mental health associates) for long term follow up and therapy/counseling  Patient Self Care Activities:  . Ability for insight . Strong support system . Patient will contact agencies provided . Does not want to schedule F/U with LCSW she will contact LCSW if needed Initial goal documentation     Review of patient status, including review of consultants reports, relevant laboratory and other test results, and collaboration with appropriate care team members and the patient's provider was performed as part of comprehensive patient evaluation and provision of care management services.    Plan:   1. No further  follow up required: by CCM care team at this time.  2. Patient declined follow up.  She will contact CCM if needs arise  Casimer Lanius, [place] / [place]    [phone] 12:28 PM

## 2019-11-14 NOTE — Patient Instructions (Signed)
Licensed Clinical Social Worker Visit Information Meredith [name]  it was nice speaking with you. Please call me directly if you have questions [phone] Goals we discussed today:  Goals Addressed            This Visit's Progress   . Advance Directive       Current Barriers:  . Patient with Anxiety and Depression  needs assistance with advance directives . Limited education about the importance of naming a healthcare power of attorney  Clinical Social Work Clinical Goal(s):  Marland Kitchen Over the next 20 days, the patient will review mailed EMMI education on Advance Directive . Over the next 30 days, patient will verbalize basic understanding of Advanced Directives and importance of completion . Over the next 30 days, the patient will complete mailed Advance Directive packet  . Over the next 45 days, the patient will have Advance Directive notarized and provide a copy to provider office  Interventions provided by LCSW: . Mailed the patient an EMMI educational handout on Advance Directives as well as an Emergency planning/management officer . Advised patient to review information mailed by this SW . Provided education and assistance to client regarding Advanced Directives. . A voluntary discussion about advanced care planning including explanation and discussion of advanced directives was extentively discussed with the patient and friend today.   . Explanation regarding healthcare proxy and living will was reviewed and packet with forms with expiration of how to fill them out was mailed.    Patient Self Care Activities:  . Is able to complete documentation independently . Can identify next of kin, power or attorney,  . Patient will review packet and bring back to office during next appointment  Initial goal documentation      . Connect with counseling       Current Barriers:  . Chronic Mental Health needs related to depression  . Lacks knowledge of community resource: with no insurance  Clinical Social  Work [employer]):  Marland Kitchen Over the next 10 days, patient will set up counseling appointment to address needs related to ongoing therapy.  Interventions: . Patient interviewed and appropriate assessments performed:  . Provided patient with information on agencies that take [employer] (family services of the [place], [place] and Mental health associates) for long term follow up and therapy/counseling  Patient Self Care Activities:  . Ability for insight . Strong support system . Patient will contact agencies provided . Does not want to schedule F/U with LCSW she will contact LCSW if needed  Initial goal documentation        Materials provided: Yes: advance directives Meredith [name] was given information about Care Management services today including:  1. Care Management services include personalized support from designated clinical staff supervised by her physician, including individualized plan of care and coordination with other care providers 2. 24/7 contact [phone] for assistance for urgent and routine care needs. 3. Care Management services at any time by phone call to the office staff.  Patient agreed to services and verbal consent obtained for phone encounter today.    The patient verbalized understanding of instructions provided today and declined a print copy of patient instruction materials.  Follow up plan: no follow up at this time   Maurine Cane, LCSW

## 2019-11-15 ENCOUNTER — Telehealth: Payer: Self-pay

## 2019-11-15 NOTE — Telephone Encounter (Signed)
Patients husband calls nurse line stating the [facility] center she was referred to does not accept their financial letter. Please send a new referral to [facility], as they will accept patient.

## 2019-11-16 ENCOUNTER — Encounter: Payer: Self-pay | Admitting: Family Medicine

## 2019-11-18 ENCOUNTER — Other Ambulatory Visit: Payer: Self-pay | Admitting: Family Medicine

## 2019-11-18 DIAGNOSIS — R21 Rash and other nonspecific skin eruption: Secondary | ICD-10-CM

## 2019-11-19 ENCOUNTER — Other Ambulatory Visit: Payer: Self-pay | Admitting: Family Medicine

## 2019-11-20 ENCOUNTER — Encounter: Payer: Self-pay | Admitting: Family Medicine

## 2019-11-20 ENCOUNTER — Ambulatory Visit: Payer: Self-pay | Admitting: Licensed Clinical Social Worker

## 2019-11-20 NOTE — Chronic Care Management (AMB) (Signed)
    Clinical Social Work  Care Management Outreach   11/20/2019 Name: Javeyah Draine MRN: [id number] DOB: 24-May-1966  Yakeline Grajewski is a 54 y.o. year old female who is a primary care patient of Lattie Haw, MD .   LCSW reached out to Agnes Lawrence and [name] today by phone to confirm they wanted the referral to [facility] for physical therapy. Per [name] this is the location they discussed with PCP and would like LCSW to fax over the referral.  Intervention: LCSW explained [facility] as well as provided contact information and phone number [phone].  Referral faxed. Clinic will reach out for additional information if needed.  Plan: No additional follow up required by LCSW at this time.  Casimer Lanius, LCSW Clinical Social Worker [place] / [place]   [phone] 5:15 PM

## 2019-11-21 ENCOUNTER — Ambulatory Visit (INDEPENDENT_AMBULATORY_CARE_PROVIDER_SITE_OTHER): Payer: Self-pay | Admitting: Gastroenterology

## 2019-11-21 ENCOUNTER — Other Ambulatory Visit: Payer: Self-pay

## 2019-11-21 ENCOUNTER — Encounter: Payer: Self-pay | Admitting: Gastroenterology

## 2019-11-21 VITALS — BP 148/89 | HR 73 | Temp 97.5°F | Resp 17 | Wt 128.4 lb

## 2019-11-21 DIAGNOSIS — K3184 Gastroparesis: Secondary | ICD-10-CM

## 2019-11-21 DIAGNOSIS — R7989 Other specified abnormal findings of blood chemistry: Secondary | ICD-10-CM

## 2019-11-21 DIAGNOSIS — K581 Irritable bowel syndrome with constipation: Secondary | ICD-10-CM

## 2019-11-21 MED ORDER — ERYTHROMYCIN BASE 250 MG PO TABS: 250.0000 mg | ORAL_TABLET | Freq: Three times a day (TID) | ORAL | 0 refills | Status: AC

## 2019-11-21 NOTE — Progress Notes (Signed)
Meredith Darby, MD [address]  [place]  [place], [place] [address]  Main: [phone]  Fax: [phone]    Gastroenterology Consultation  Referring Provider:     Leeanne Rio, MD Primary Care Physician:  Meredith Haw, MD Primary Gastroenterologist:  Dr. Cephas [name] Reason for Consultation: Gastroparesis and chronic constipation        HPI:   Meredith [name] is a 54 y.o. female referred by Dr. Lattie Haw, MD  for consultation & management of gastroparesis and chronic constipation. Patient transferred her care from [place] [facility] Dr. Delle Reining to [facility] as she moved to [facility] with her boyfriend. Patient is diagnosed with gastroparesis after work-up of nausea, vomiting, diarrhea, gastric emptying study which revealed significantly abnormal delayed gastric emptying.  Patient was taking Reglan which resulted in twitching of her face, therefore she stopped.  She is currently on Dexilant in the morning and famotidine at bedtime.  She is still struggling with gastroparesis symptoms, nausea and vomiting despite eating small portions.  However, her weight has been stable Her hemoglobin A1c was 6.6 in 2018 Food allergy profile revealed allergy to milk, nuts, hazelnuts and eggs  She also has chronic constipation, diagnosed with irritable bowel syndrome mixed type, started on Motegrity by her previous GI.  Apparently, patient is taking motility as needed only.  She does have infrequent bowel movements associated with abdominal bloating  Elevated LFTs: Patient had secondary liver disease work-up in the past negative for viral hepatitis A, B and C, normal ANA, anti-smooth muscle and antimitochondrial antibodies.  Ultrasound Doppler negative for portal vein thrombosis.  MRI/MRCP was unremarkable except for fatty liver.  Most recently, her serum ferritin levels were 555 She moved from [place] in 2012, denies any recent travel She does not smoke or drink  alcohol  She has history of cholecystectomy in 2017  NSAIDs: None  Antiplts/Anticoagulants/Anti thrombotics: None  GI Procedures: EGD 10/12/2017 1) Duodenum, Mucosal Biopsy: - Duodenal mucosa with overall intact villous architecture. - No significant intraepithelial lymphocytosis seen  2) Gastric Mucosal Biopsy: -  Mild chronic, inactive gastritis. -  An immunohistochemical stain for H.pylori is negative with satisfactory positive control.  Colonoscopy 12/26/2017, 11/30/2017, report not available  Upper GI with small bowel follow-through 01/03/2018 FINDINGS: Mild intermittent gastroesophageal reflux. Normal primary stripping wave.   The stomach, duodenal bulb and rest of the duodenal sweep appear normal.  . Small bowel is normal in caliber. Normal fold pattern. Normal transit, with contrast reaching the colon 2 hours. Terminal ileum is not well visualized secondary to overlying bowel loops but is grossly normal.  Past Medical History:  Diagnosis Date  . Anemia   . Anxiety   . Asthma   . Chronic insomnia   . Depression   . Dermatitis   . Gastroparesis   . GERD (gastroesophageal reflux disease)   . Hypertension   . Hypothyroidism   . Rhinitis     Past Surgical History:  Procedure Laterality Date  . APPENDECTOMY    . CHOLECYSTECTOMY  2017    Current Outpatient Medications:  .  albuterol (VENTOLIN HFA) 108 (90 Base) MCG/ACT inhaler, Inhale 1 puff into the lungs every 4 (four) hours., Disp: , Rfl:  .  atenolol (TENORMIN) 100 MG tablet, Take 1 tablet by mouth once daily, Disp: , Rfl:  .  cyanocobalamin (,VITAMIN B-12,) 1000 MCG/ML injection, INJECT 1ML INTO THE MUSCLE EVERY 30 DAYS, Disp: , Rfl:  .  dexlansoprazole (DEXILANT) 60 MG capsule, Take 60  mg by mouth daily., Disp: , Rfl:  .  dicyclomine (BENTYL) 20 MG tablet, Take by mouth., Disp: , Rfl:  .  DULoxetine (CYMBALTA) 60 MG capsule, Take 60 mg by mouth 2 (two) times daily., Disp: , Rfl:  .  famotidine (PEPCID) 20 MG  tablet, Take 20 mg by mouth daily., Disp: , Rfl:  .  levothyroxine (SYNTHROID) 125 MCG tablet, Take 1 tablet (125 mcg total) by mouth every morning. 30 minutes before food, Disp: 90 tablet, Rfl: 3 .  Meclizine HCl 25 MG CHEW, Chew 25 mg by mouth 3 (three) times daily with meals., Disp: , Rfl:  .  Prucalopride Succinate (MOTEGRITY) 2 MG TABS, Take by mouth., Disp: , Rfl:  .  SYRINGE-NEEDLE, DISP, 3 ML (B-D 3CC LUER-LOK SYR 25GX1") 25G X 1" 3 ML MISC, Use 1 syringe with needle monthly with B-1 2injections, Disp: , Rfl:  .  zolpidem (AMBIEN CR) 12.5 MG CR tablet, Take 1 tablet (12.5 mg total) by mouth at bedtime as needed., Disp: 30 tablet, Rfl: 0 .  budesonide-formoterol (SYMBICORT) 160-4.5 MCG/ACT inhaler, Inhale into the lungs., Disp: , Rfl:  .  budesonide-formoterol (SYMBICORT) 80-4.5 MCG/ACT inhaler, INHALE 2 PUFFS INTO THE LUNGS 2 TIMES A DAY., Disp: , Rfl:  .  Zolpidem Tartrate 10 MG SUBL, Take by mouth., Disp: , Rfl:    No family history on file.   Social History   Tobacco Use  . Smoking status: Never Smoker  . Smokeless tobacco: Never Used  Substance Use Topics  . Alcohol use: No  . Drug use: No    Allergies as of 11/21/2019 - Review Complete 11/21/2019  Allergen Reaction Noted  . Latex Rash 10/20/2014  . Shellfish allergy Rash 05/13/2016  . Amoxicillin-pot clavulanate  09/02/2011    Review of Systems:    All systems reviewed and negative except where noted in HPI.   Physical Exam:  BP (!) 148/89 (BP Location: Left Arm, Patient Position: Sitting, Cuff Size: Large)   Pulse 73   Temp (!) 97.5 F (36.4 C)   Resp 17   Wt 128 lb 6.4 oz (58.2 kg)   BMI 24.66 kg/m  No LMP recorded. Patient is postmenopausal.  General:   Alert,  Well-developed, well-nourished, pleasant and cooperative in NAD Head:  Normocephalic and atraumatic. Eyes:  Sclera clear, no icterus.   Conjunctiva pink. Ears:  Normal auditory acuity. Nose:  No deformity, discharge, or lesions. Mouth:  No  deformity or lesions,oropharynx pink & moist. Neck:  Supple; no masses or thyromegaly. Lungs:  Respirations even and unlabored.  Clear throughout to auscultation.   No wheezes, crackles, or rhonchi. No acute distress. Heart:  Regular rate and rhythm; no murmurs, clicks, rubs, or gallops. Abdomen:  Normal bowel sounds. Soft, non-tender and non-distended without masses, hepatosplenomegaly or hernias noted.  No guarding or rebound tenderness.   Rectal: Not performed Msk:  Symmetrical without gross deformities. Good, equal movement & strength bilaterally. Pulses:  Normal pulses noted. Extremities:  No clubbing or edema.  No cyanosis. Neurologic:  Alert and oriented x3;  grossly normal neurologically. Skin:  Intact without significant lesions or rashes. No jaundice. Psych:  Alert and cooperative. Normal mood and affect.  Imaging Studies: Reviewed  Assessment and Plan:   Meredith [name] is a 54 y.o. Filipino female with prediabetes is seen in consultation for gastroparesis and chronic constipation  Gastroparesis Continue Dexilant and Pepcid Discussed about gastroparesis diet, information provided Patient did not tolerate Reglan in the past which has resulted in  twitching of her face We will try 2 weeks course of erythromycin 250 mg 3 times daily before meals Check hemoglobin A1c, H. pylori IgG  Chronic constipation Advised patient to take Motegrity 2 mg once daily in the morning  Abnormal LFTs, history of fatty liver based on imaging Work-up has been unrevealing thus far except for fatty liver Elevated serum ferritin levels, rule out hereditary hemochromatosis Recommend liver biopsy if hereditary hemochromatosis is ruled out   Follow up in 4 weeks   Meredith Darby, MD

## 2019-11-22 ENCOUNTER — Encounter: Payer: Self-pay | Admitting: Family Medicine

## 2019-11-25 NOTE — Progress Notes (Signed)
[facility] [address] [place] Phone: [phone] Subjective:   I Kandace Blitz am serving as a Education administrator for Dr. Hulan Saas.  This visit occurred during the SARS-CoV-2 public health emergency.  Safety protocols were in place, including screening questions prior to the visit, additional usage of staff PPE, and extensive cleaning of exam room while observing appropriate contact time as indicated for disinfecting solutions.    I'm seeing this patient by the request  of:  Lattie Haw, MD  CC: Neck and arm pain  [id number]  Rodnisha Schaadt is a 54 y.o. female coming in with complaint of left arm, neck and back pain. Tingling bilaterally. Sharp pains in the hand. Has been told she has osteophytes in the neck. Neck cracks and pops a lot.   Onset- Chronic  Location - bilateral neck an arm, lower back Duration-  Character- achy, stabbing Aggravating factors- sitting for long periods of time, laying down  Reliving factors-  Therapies tried- tylenol   Severity- 8/10 at its worse   Reviewed patient's chart.  Patient did have an MRI from 2011 that was independently visualized by me patient did have a moderate sized disc protrusion at T1-2 and mild bulging at C6-7 with no significant foraminal stenosis  Past Medical History:  Diagnosis Date  . Anemia   . Anxiety   . Asthma   . Chronic insomnia   . Depression   . Dermatitis   . Gastroparesis   . GERD (gastroesophageal reflux disease)   . Hypertension   . Hypothyroidism   . Rhinitis    Past Surgical History:  Procedure Laterality Date  . APPENDECTOMY    . CHOLECYSTECTOMY  2017   Social History   Socioeconomic History  . Marital status: Unknown    Spouse name: Not on file  . Number of children: Not on file  . Years of education: Not on file  . Highest education level: Not on file  Occupational History  . Occupation: unemployed   Tobacco Use  . Smoking status: Never  Smoker  . Smokeless tobacco: Never Used  Substance and Sexual Activity  . Alcohol use: No  . Drug use: No  . Sexual activity: Not on file  Other Topics Concern  . Not on file  Social History Narrative  . Not on file   Social Determinants of Health   Financial Resource Strain:   . Difficulty of Paying Living Expenses: Not on file  Food Insecurity:   . Worried About Charity fundraiser in the Last Year: Not on file  . Ran Out of Food in the Last Year: Not on file  Transportation Needs:   . Lack of Transportation (Medical): Not on file  . Lack of Transportation (Non-Medical): Not on file  Physical Activity:   . Days of Exercise per Week: Not on file  . Minutes of Exercise per Session: Not on file  Stress:   . Feeling of Stress : Not on file  Social Connections:   . Frequency of Communication with Friends and Family: Not on file  . Frequency of Social Gatherings with Friends and Family: Not on file  . Attends Religious Services: Not on file  . Active Member of Clubs or Organizations: Not on file  . Attends Archivist Meetings: Not on file  . Marital Status: Not on file   Allergies  Allergen Reactions  . Latex Rash  . Shellfish Allergy Rash    Not all  the time   . Amoxicillin-Pot Clavulanate     unknown   No family history on file.  Current Outpatient Medications (Endocrine & Metabolic):  .  levothyroxine (SYNTHROID) 125 MCG tablet, Take 1 tablet (125 mcg total) by mouth every morning. 30 minutes before food  Current Outpatient Medications (Cardiovascular):  .  atenolol (TENORMIN) 100 MG tablet, Take 1 tablet by mouth once daily  Current Outpatient Medications (Respiratory):  .  albuterol (VENTOLIN HFA) 108 (90 Base) MCG/ACT inhaler, Inhale 1 puff into the lungs every 4 (four) hours. .  budesonide-formoterol (SYMBICORT) 160-4.5 MCG/ACT inhaler, Inhale into the lungs. .  budesonide-formoterol (SYMBICORT) 80-4.5 MCG/ACT inhaler, INHALE 2 PUFFS INTO THE LUNGS 2  TIMES A DAY.   Current Outpatient Medications (Hematological):  .  cyanocobalamin (,VITAMIN B-12,) 1000 MCG/ML injection, INJECT 1ML INTO THE MUSCLE EVERY 30 DAYS  Current Outpatient Medications (Other):  .  dexlansoprazole (DEXILANT) 60 MG capsule, Take 60 mg by mouth daily. Marland Kitchen  dicyclomine (BENTYL) 20 MG tablet, Take by mouth. .  DULoxetine (CYMBALTA) 60 MG capsule, Take 60 mg by mouth 2 (two) times daily. Marland Kitchen  erythromycin (E-MYCIN) 250 MG tablet, Take 1 tablet (250 mg total) by mouth 3 (three) times daily before meals for 14 days. .  famotidine (PEPCID) 20 MG tablet, Take 20 mg by mouth daily. .  Meclizine HCl 25 MG CHEW, Chew 25 mg by mouth 3 (three) times daily with meals. .  Prucalopride Succinate (MOTEGRITY) 2 MG TABS, Take by mouth. .  SYRINGE-NEEDLE, DISP, 3 ML (B-D 3CC LUER-LOK SYR 25GX1") 25G X 1" 3 ML MISC, Use 1 syringe with needle monthly with B-1 2injections .  zolpidem (AMBIEN CR) 12.5 MG CR tablet, Take 1 tablet (12.5 mg total) by mouth at bedtime as needed. .  Zolpidem Tartrate 10 MG SUBL, Take by mouth.   Reviewed prior external information including notes and imaging from  primary care provider As well as notes that were available from care everywhere and other healthcare systems.  Past medical history, social, surgical and family history all reviewed in electronic medical record.  No pertanent information unless stated regarding to the chief complaint.   Review of Systems:  No headache, visual changes, nausea, vomiting, diarrhea, constipation, dizziness, abdominal pain, skin rash, fevers, chills, night sweats, weight loss, swollen lymph nodes, body aches, joint swelling, chest pain, shortness of breath, mood changes. POSITIVE muscle aches  Objective  Blood pressure 132/90, pulse (!) 101, height 5' (1.524 m), weight 128 lb (58.1 kg), SpO2 98 %.   General: No apparent distress alert and oriented x3 mood and affect normal, dressed appropriately.  HEENT: Pupils equal,  extraocular movements intact  Respiratory: Patient's speak in full sentences and does not appear short of breath  Cardiovascular: No lower extremity edema, non tender, no erythema  Skin: Warm dry intact with no signs of infection or rash on extremities or on axial skeleton.  Abdomen: Soft tender diffusely Neuro: Cranial nerves II through XII are intact, neurovascularly intact in all extremities with 2+ DTRs and 2+ pulses.  Lymph: No lymphadenopathy of posterior or anterior cervical chain or axillae bilaterally.  Gait normal with good balance and coordination.  MSK: Patient's pain is out of proportion to the amount of palpation in multiple different areas especially in the musculature. Back exam does have some loss of lordosis, diffuse tenderness even to light palpation.  Seems to be more in the muscular area and not as much on the bony prominences.  Patient does have some  tightness with Corky Sox test.  Tightness with Corky Sox negative straight leg but tightness of the hamstrings. Neck exam does have some mild loss of lordosis.  Patient does have tenderness diffusely again.  Negative Spurling's though.    Osteopathic findings C4 flexed rotated and side bent left C6 flexed rotated and side bent left T3 extended rotated and side bent right inhaled third rib L2 flexed rotated and side bent right Sacrum right on right  Impression and Recommendations:     This case required medical decision making of moderate complexity. The above documentation has been reviewed and is accurate and complete Lyndal Pulley, DO       Note: This dictation was prepared with Dragon dictation along with smaller phrase technology. Any transcriptional errors that result from this process are unintentional.

## 2019-11-26 ENCOUNTER — Other Ambulatory Visit (INDEPENDENT_AMBULATORY_CARE_PROVIDER_SITE_OTHER): Payer: Self-pay

## 2019-11-26 ENCOUNTER — Encounter: Payer: Self-pay | Admitting: Gastroenterology

## 2019-11-26 ENCOUNTER — Ambulatory Visit (INDEPENDENT_AMBULATORY_CARE_PROVIDER_SITE_OTHER): Payer: Self-pay | Admitting: Family Medicine

## 2019-11-26 ENCOUNTER — Encounter: Payer: Self-pay | Admitting: Family Medicine

## 2019-11-26 ENCOUNTER — Other Ambulatory Visit: Payer: Self-pay

## 2019-11-26 VITALS — BP 132/90 | HR 101 | Ht 60.0 in | Wt 128.0 lb

## 2019-11-26 DIAGNOSIS — F332 Major depressive disorder, recurrent severe without psychotic features: Secondary | ICD-10-CM

## 2019-11-26 DIAGNOSIS — M999 Biomechanical lesion, unspecified: Secondary | ICD-10-CM | POA: Insufficient documentation

## 2019-11-26 DIAGNOSIS — M797 Fibromyalgia: Secondary | ICD-10-CM

## 2019-11-26 DIAGNOSIS — R7989 Other specified abnormal findings of blood chemistry: Secondary | ICD-10-CM

## 2019-11-26 DIAGNOSIS — M2550 Pain in unspecified joint: Secondary | ICD-10-CM

## 2019-11-26 DIAGNOSIS — M5417 Radiculopathy, lumbosacral region: Secondary | ICD-10-CM

## 2019-11-26 DIAGNOSIS — R945 Abnormal results of liver function studies: Secondary | ICD-10-CM

## 2019-11-26 LAB — SEDIMENTATION RATE: Sed Rate: 42 mm/hr — ABNORMAL HIGH (ref 0–30)

## 2019-11-26 MED ORDER — KETOROLAC TROMETHAMINE 60 MG/2ML IM SOLN
60.0000 mg | Freq: Once | INTRAMUSCULAR | Status: AC
  Administered 2019-11-26: 60 mg via INTRAMUSCULAR

## 2019-11-26 NOTE — Assessment & Plan Note (Signed)
Decision today to treat with OMT was based on Physical Exam  After verbal consent patient was treated with HVLA, ME, FPR techniques in cervical, thoracic, rib,  lumbar and sacral areas  Patient tolerated the procedure well with improvement in symptoms  Patient given exercises, stretches and lifestyle modifications  See medications in patient instructions if given  Patient will follow up in 4-8 weeks 

## 2019-11-26 NOTE — Assessment & Plan Note (Signed)
Recheck today. 

## 2019-11-26 NOTE — Assessment & Plan Note (Signed)
Recheck again today

## 2019-11-26 NOTE — Assessment & Plan Note (Signed)
Patient has had chronic radiculopathy previously but negative straight leg test noted today.  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  This could be contributing to some of the discomfort.  I do believe that fibromyalgia is likely a consistent diagnosis as well.  Patient will start with formal physical therapy which I think will be beneficial.  Patient's laboratory work-up though did show elevation in liver enzymes and I would like to recheck at this point.  In addition to this we will check patient's calcium level that was elevated at 10.4 with an ionized calcium and a PTH.  See if anything else is abnormal at this time.  This could maybe help [place] direct some of the medical management.  Attempted osteopathic manipulation follow-up again in 4 weeks.

## 2019-11-26 NOTE — Patient Instructions (Addendum)
Good to see you [facility] 3 times a week See me again in 4 weeks

## 2019-11-27 ENCOUNTER — Encounter: Payer: Self-pay | Admitting: Gastroenterology

## 2019-11-27 LAB — FERRITIN: Ferritin: 160 ng/mL (ref 10.0–291.0)

## 2019-11-27 LAB — COMPREHENSIVE METABOLIC PANEL
ALT: 49 U/L — ABNORMAL HIGH (ref 0–35)
AST: 43 U/L — ABNORMAL HIGH (ref 0–37)
Albumin: 4.8 g/dL (ref 3.5–5.2)
Alkaline Phosphatase: 102 U/L (ref 39–117)
BUN: 8 mg/dL (ref 6–23)
CO2: 29 mEq/L (ref 19–32)
Calcium: 9.6 mg/dL (ref 8.4–10.5)
Chloride: 99 mEq/L (ref 96–112)
Creatinine, Ser: 0.63 mg/dL (ref 0.40–1.20)
GFR: 98.02 mL/min (ref >60.00)
Glucose, Bld: 138 mg/dL — ABNORMAL HIGH (ref 70–99)
Potassium: 3.5 mEq/L (ref 3.5–5.1)
Sodium: 137 mEq/L (ref 135–145)
Total Bilirubin: 0.6 mg/dL (ref 0.2–1.2)
Total Protein: 7.9 g/dL (ref 6.0–8.3)

## 2019-11-27 LAB — HEMOGLOBIN A1C
Est. average glucose Bld gHb Est-mCnc: 137 mg/dL
Hgb A1c MFr Bld: 6.4 % — ABNORMAL HIGH (ref 4.8–5.6)

## 2019-11-27 LAB — H. PYLORI ANTIBODY, IGG: H. pylori, IgG AbS: 0.38 Index Value (ref 0.00–0.79)

## 2019-11-27 LAB — HEMOCHROMATOSIS DNA-PCR(C282Y,H63D)

## 2019-11-27 LAB — URIC ACID: Uric Acid, Serum: 4.6 mg/dL (ref 2.4–7.0)

## 2019-11-27 LAB — LIPASE: Lipase: 54 U/L (ref 11.0–59.0)

## 2019-11-28 ENCOUNTER — Other Ambulatory Visit: Payer: Self-pay

## 2019-11-28 DIAGNOSIS — R7989 Other specified abnormal findings of blood chemistry: Secondary | ICD-10-CM

## 2019-11-29 ENCOUNTER — Telehealth: Payer: Self-pay

## 2019-11-29 NOTE — Telephone Encounter (Signed)
US guided liver biopsy has been scheduled for 12/11/2019 @ 9:30, pt has been notified

## 2019-11-29 NOTE — Telephone Encounter (Signed)
-----   Message from Lin Landsman, MD sent at 11/27/2019  3:02 PM EST ----- She does not have hemochromatosis.  I still do not have a good explanation for her elevated liver enzymes Other than fatty liver.  Therefore, I recommend ultrasound-guided liver biopsy.  Please talk to the patient and schedule if she is willing to undergo

## 2019-11-30 LAB — CYCLIC CITRUL PEPTIDE ANTIBODY, IGG: Cyclic Citrullin Peptide Ab: <16 UNITS

## 2019-11-30 LAB — EXTRA SPECIMEN

## 2019-11-30 LAB — VITAMIN D 1,25 DIHYDROXY
Vitamin D 1, 25 (OH)2 Total: 73 pg/mL — ABNORMAL HIGH (ref 18–72)
Vitamin D2 1, 25 (OH)2: <8 pg/mL
Vitamin D3 1, 25 (OH)2: 73 pg/mL

## 2019-11-30 LAB — RHEUMATOID FACTOR: Rheumatoid fact SerPl-aCnc: <14 IU/mL (ref <14)

## 2019-11-30 LAB — ANA: Anti Nuclear Antibody (ANA): POSITIVE — AB

## 2019-11-30 LAB — ANTI-NUCLEAR AB-TITER (ANA TITER)
ANA TITER: 1:40 {titer} — ABNORMAL HIGH
ANA Titer 1: 1:40 {titer} — ABNORMAL HIGH

## 2019-11-30 LAB — ANTI-DNA ANTIBODY, DOUBLE-STRANDED: ds DNA Ab: <1 IU/mL

## 2019-11-30 LAB — PTH, INTACT AND CALCIUM
Calcium: 9.5 mg/dL (ref 8.6–10.4)
PTH: 35 pg/mL (ref 14–64)

## 2019-11-30 LAB — CALCIUM, IONIZED: Calcium, Ion: 4.93 mg/dL (ref 4.8–5.6)

## 2019-12-02 NOTE — Telephone Encounter (Signed)
Referred pt to Dentist.

## 2019-12-02 NOTE — Telephone Encounter (Signed)
Erroneous encounter

## 2019-12-03 ENCOUNTER — Encounter: Payer: Self-pay | Admitting: Family Medicine

## 2019-12-10 ENCOUNTER — Other Ambulatory Visit: Payer: Self-pay | Admitting: Radiology

## 2019-12-10 ENCOUNTER — Ambulatory Visit: Payer: Self-pay | Admitting: Licensed Clinical Social Worker

## 2019-12-10 NOTE — Chronic Care Management (AMB) (Signed)
   Social Work  Care Management  Collaboration update 12/10/2019 Name: Meredith [name] MRN: [id number] DOB: [birth year]   Meredith [name] is a 54 y.o. year old female who sees Lattie Haw, MD for primary care. LCSW was consulted by PCP to assistance patient with connecting to resources for physical therapy. Patient was not interviewed or contacted during this encounter however LCSW reviewed chart, notes, insurance and collaborated with [employer].  LCSW informed by [facility] that patient was scheduled for PT evaluation and Treatment at the [facility] on 11/26/19  Plan: No further follow up required by LCSW at this time   Casimer Lanius, [place] / [place]   [phone] 1:13 PM

## 2019-12-11 ENCOUNTER — Ambulatory Visit
Admission: RE | Admit: 2019-12-11 | Discharge: 2019-12-11 | Disposition: A | Discharge status: Home / Self Care | Payer: Medicaid Other | Source: Ambulatory Visit | Attending: Gastroenterology | Admitting: Gastroenterology

## 2019-12-11 ENCOUNTER — Other Ambulatory Visit: Payer: Self-pay

## 2019-12-11 DIAGNOSIS — K759 Inflammatory liver disease, unspecified: Secondary | ICD-10-CM | POA: Insufficient documentation

## 2019-12-11 DIAGNOSIS — R7989 Other specified abnormal findings of blood chemistry: Secondary | ICD-10-CM

## 2019-12-11 DIAGNOSIS — K760 Fatty (change of) liver, not elsewhere classified: Secondary | ICD-10-CM | POA: Diagnosis not present

## 2019-12-11 DIAGNOSIS — K7400 Hepatic fibrosis, unspecified: Secondary | ICD-10-CM | POA: Diagnosis not present

## 2019-12-11 LAB — CBC
HCT: 40.6 % (ref 36.0–46.0)
Hemoglobin: 13.8 g/dL (ref 12.0–15.0)
MCH: 28.1 pg (ref 26.0–34.0)
MCHC: 34 g/dL (ref 30.0–36.0)
MCV: 82.7 fL (ref 80.0–100.0)
Platelets: 344 10*3/uL (ref 150–400)
RBC: 4.91 MIL/uL (ref 3.87–5.11)
RDW: 12.7 % (ref 11.5–15.5)
WBC: 6.7 10*3/uL (ref 4.0–10.5)
nRBC: 0 % (ref 0.0–0.2)

## 2019-12-11 LAB — PROTIME-INR
INR: 1 (ref 0.8–1.2)
Prothrombin Time: 13.4 seconds (ref 11.4–15.2)

## 2019-12-11 MED ORDER — FENTANYL CITRATE (PF) 100 MCG/2ML IJ SOLN
INTRAMUSCULAR | Status: AC
  Filled 2019-12-11: qty 2

## 2019-12-11 MED ORDER — MIDAZOLAM HCL 2 MG/2ML IJ SOLN
INTRAMUSCULAR | Status: AC
  Filled 2019-12-11: qty 2

## 2019-12-11 MED ORDER — MIDAZOLAM HCL 2 MG/2ML IJ SOLN
INTRAMUSCULAR | Status: AC | PRN
  Administered 2019-12-11 (×2): 1 mg via INTRAVENOUS

## 2019-12-11 MED ORDER — SODIUM CHLORIDE 0.9 % IV SOLN: INTRAVENOUS | Status: DC

## 2019-12-11 MED ORDER — FENTANYL CITRATE (PF) 100 MCG/2ML IJ SOLN
INTRAMUSCULAR | Status: AC | PRN
  Administered 2019-12-11 (×2): 50 ug via INTRAVENOUS

## 2019-12-11 NOTE — Procedures (Signed)
Interventional Radiology Procedure Note  Procedure: US Guided Biopsy of liver  Complications: None  Estimated Blood Loss: < 10 mL  Findings: 18 G core biopsy of right lobe liver parenchyma performed under US guidance.  Three core samples obtained and sent to Pathology.  Venetia Night. Kathlene Cote, M.D Pager:  [phone]

## 2019-12-11 NOTE — Discharge Instructions (Signed)

## 2019-12-16 ENCOUNTER — Other Ambulatory Visit: Payer: Self-pay | Admitting: Family Medicine

## 2019-12-17 LAB — SURGICAL PATHOLOGY

## 2019-12-18 ENCOUNTER — Encounter: Payer: Self-pay | Admitting: Gastroenterology

## 2019-12-18 DIAGNOSIS — R7989 Other specified abnormal findings of blood chemistry: Secondary | ICD-10-CM

## 2019-12-20 ENCOUNTER — Encounter: Payer: Self-pay | Admitting: Family Medicine

## 2019-12-20 LAB — CELIAC DISEASE PANEL
Endomysial IgA: NEGATIVE
IgA/Immunoglobulin A, Serum: 252 mg/dL (ref 87–352)
Transglutaminase IgA: <2 U/mL (ref 0–3)

## 2019-12-24 ENCOUNTER — Other Ambulatory Visit: Payer: Self-pay

## 2019-12-24 ENCOUNTER — Ambulatory Visit (INDEPENDENT_AMBULATORY_CARE_PROVIDER_SITE_OTHER): Payer: Self-pay | Admitting: Family Medicine

## 2019-12-24 ENCOUNTER — Ambulatory Visit: Payer: Medicaid Other | Admitting: Gastroenterology

## 2019-12-24 ENCOUNTER — Ambulatory Visit: Payer: Medicaid Other | Admitting: Family Medicine

## 2019-12-24 ENCOUNTER — Ambulatory Visit: Payer: Medicaid Other | Admitting: Licensed Clinical Social Worker

## 2019-12-24 ENCOUNTER — Encounter: Payer: Self-pay | Admitting: Family Medicine

## 2019-12-24 DIAGNOSIS — F332 Major depressive disorder, recurrent severe without psychotic features: Secondary | ICD-10-CM

## 2019-12-24 DIAGNOSIS — F331 Major depressive disorder, recurrent, moderate: Secondary | ICD-10-CM

## 2019-12-24 DIAGNOSIS — Z7189 Other specified counseling: Secondary | ICD-10-CM

## 2019-12-24 DIAGNOSIS — R45851 Suicidal ideations: Secondary | ICD-10-CM

## 2019-12-24 MED ORDER — ZOLPIDEM TARTRATE ER 12.5 MG PO TBCR: 12.5000 mg | EXTENDED_RELEASE_TABLET | Freq: Every evening | ORAL | 0 refills | Status: DC | PRN

## 2019-12-24 NOTE — Patient Instructions (Addendum)
Ms Petrova,  It was lovely to see you today. I am extremely sorry that you have been feeling low and not sleeping well and sorry that you have had thoughts of suicide. I believe these thoughts are due you not having the Zolpidem for a few days. Thank you for coming into see me and for opening up to me. You saw myself and the clinical social worker today and we have come up with a good plan for you:  1) I will be restarting your Zolpidem today at the same dose you were taking before 2) You will see your therapist today at [place]) Neoma Laming will provide you with an appointment with Psychiatry 4) Continue Duloxetine at the current dose   5) Follow up appointment with me at 8:45 on 3/5 thurs 2pm 6) Follow up telephone appointment with Neoma Laming on 3/3 at 2pm 7) [name] will keep a closer on you this week and reach out to me If he is concerned about you feeling more low/suicidal.  Please do not hesitate to contact me if you want to discuss anything else. I hope you feel better soon. I am proud of how far you have come.   National Suicide Prevention Lifeline Hours: Available 24 hours.  Tel: [phone]  Best wishes,  Dr Posey Pronto

## 2019-12-24 NOTE — Chronic Care Management (AMB) (Signed)
[place] Screening   12/24/2019 Name: Meredith [name] MRN: [id number] DOB: 1966-05-02 Meredith [name] is a 54 y.o. year old female who sees Lattie Haw, MD for primary care.  LCSW was consulted to assess mental health needs and assist the patient with crisis support.  Presenting issue / symptoms/concerns: reporting SI thoughts, has a plan but does not have access to means Duration of symptoms/ how impacting : past few days Recent life changes: continued pain and lack of sleep for past 4 days Family / Social support: good support with live in boyfriend  Psychiatric History - Diagnoses: depression and history of trauma - Hospitalizations/ prior attempts:  none - Pharmacotherapy: 60 mg Cymbalta 2 times daily - Outpatient therapy: [facility] weekly  Assessment: Patient is currently experiencing symptoms of depression which are exacerbated by health related stressors and sleep depervation. Patient reports no sleep the past 4 days, mind racing.  Reports no missed does of Cymbalta states medication however states it is not working.Currently seeing therapist weekly.   Recommendation: Patient may benefit from, and is in agreement to medication evaluation with psychiatry.   Intervention: . Crisis Doctor, hospital provided as well as discussed Chief Strategy Officer.  OBJECTIVE:  Depression screen [facility] 2/9 12/24/2019 11/13/2019 11/04/2019  Decreased Interest 1 1 3   Down, Depressed, Hopeless 2 1 3   PHQ - 2 Score 3 2 6   Altered sleeping - - -  Tired, decreased energy - - -  Change in appetite - - -  Feeling bad or failure about yourself  - - -  Trouble concentrating - - -  Moving slowly or fidgety/restless - - -  Suicidal thoughts - - -  PHQ-9 Score - - -  Difficult doing work/chores - - -     Outpatient Encounter Medications as of 12/24/2019  Medication Sig  . albuterol (VENTOLIN HFA) 108 (90 Base) MCG/ACT inhaler Inhale 1 puff into the lungs  every 4 (four) hours.  Marland Kitchen atenolol (TENORMIN) 100 MG tablet Take 1 tablet by mouth once daily  . budesonide-formoterol (SYMBICORT) 160-4.5 MCG/ACT inhaler Inhale into the lungs.   . budesonide-formoterol (SYMBICORT) 80-4.5 MCG/ACT inhaler INHALE 2 PUFFS INTO THE LUNGS 2 TIMES A DAY.  . cyanocobalamin (,VITAMIN B-12,) 1000 MCG/ML injection INJECT 1ML INTO THE MUSCLE EVERY 30 DAYS  . dexlansoprazole (DEXILANT) 60 MG capsule Take 60 mg by mouth daily.  Marland Kitchen dicyclomine (BENTYL) 20 MG tablet Take by mouth.  . DULoxetine (CYMBALTA) 60 MG capsule Take 60 mg by mouth 2 (two) times daily.  . famotidine (PEPCID) 20 MG tablet Take 20 mg by mouth daily.  Marland Kitchen levothyroxine (SYNTHROID) 125 MCG tablet Take 1 tablet (125 mcg total) by mouth every morning. 30 minutes before food  . Meclizine HCl 25 MG CHEW Chew 25 mg by mouth 3 (three) times daily with meals.  . SYRINGE-NEEDLE, DISP, 3 ML (B-D 3CC LUER-LOK SYR 25GX1") 25G X 1" 3 ML MISC Use 1 syringe with needle monthly with B-1 2injections  . zolpidem (AMBIEN CR) 12.5 MG CR tablet Take 1 tablet (12.5 mg total) by mouth at bedtime as needed.  . Zolpidem Tartrate 10 MG SUBL Take by mouth.   No facility-administered encounter medications on file as of 12/24/2019.   Review of patient status, including review of consultants reports, relevant laboratory and other test results, and collaboration with appropriate care team members and the patient's provider was performed as part of comprehensive patient evaluation and provision of care management services.  SDOH (Social Determinants of Health) assessments performed: Yes SDOH Interventions     Most Recent Value  SDOH Interventions  SDOH Interventions for the Following Domains  Depression, Stress  Stress Interventions  Provide Counseling  Depression Interventions/Treatment   Referral to Psychiatry      Goals Addressed            This Visit's Progress   . Connect with counseling        CARE PLAN ENTRY (see  longtitudinal plan of care for additional care plan information)  Current Barriers/ Progress:  . Chronic Mental Health needs related to depression  . Patient reports having SI thoughts,  . Lack of sleep . Patient has therapist at [place] Coralyn Pear [phone])  Next appointment today at 1:00 Clinical Social Work Goal(s):  Marland Kitchen Over the next 30 days, patient will continue with counseling appointments and connect with psychiatry for ongoing medication elevation  Interventions: . Assessed patient's understanding, education, previous treatment and care coordination needs  . Patient interviewed and appropriate assessments performed related to SI thoughts, plan and access . Provided basic mental health support, education and interventions  . Collaborated with PCP regarding patient needs . Discussed several options for psychiatry based on need and insurance. Assisted patient with narrowing the options down to ([facility] and [employer]  ) . Reviewed mental health medications with patient prescribed by PCP and discussed compliance  . Other interventions include: Crisis Doctor, hospital provided  and discussed safety plan Patient Self Care Activities & Deficits:  . Patient is unable to independently navigate community resource options without care coordination support . Patient is motivated for treatment and has support system . Patient and friend [name] will select one of the agencies from the list provided and call to schedule an appointment  Please see past updates related to this goal by clicking on the "Past Updates" button in the selected goal       Plan:  1.Pateint will keep appointment with [facility] today at 1:00 and weekly appointment going forward 2. Nadara Mustard will call [facility] to schedule psychiatry appointment 3. [name] will make sure gun is out of the home and will dispense patient's medications to her. 4. LCSW  will F/U with patein by phone in 2 days.  Casimer Lanius, LCSW Clinical Social Worker [place] / [place]   [phone] 1:41 PM

## 2019-12-24 NOTE — Progress Notes (Deleted)
[place] Phone: [phone] Subjective:    I'm seeing this patient by the request  of:  Lattie Haw, MD  CC:   [id number]   11/26/2019 Patient has had chronic radiculopathy previously but negative straight leg test noted today.  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  This could be contributing to some of the discomfort.  I do believe that fibromyalgia is likely a consistent diagnosis as well.  Patient will start with formal physical therapy which I think will be beneficial.  Patient's laboratory work-up though did show elevation in liver enzymes and I would like to recheck at this point.  In addition to this we will check patient's calcium level that was elevated at 10.4 with an ionized calcium and a PTH.  See if anything else is abnormal at this time.  This could maybe help [place] direct some of the medical management.  Attempted osteopathic manipulation follow-up again in 4 weeks.  Update 12/24/2019 Meredith [name] is a 54 y.o. female coming in with complaint of back pain. Is here for OMT. Patient states      Past Medical History:  Diagnosis Date  . Anemia   . Anxiety   . Asthma   . Chronic insomnia   . Depression   . Dermatitis   . Gastroparesis   . GERD (gastroesophageal reflux disease)   . Hypertension   . Hypothyroidism   . Rhinitis    Past Surgical History:  Procedure Laterality Date  . APPENDECTOMY    . CHOLECYSTECTOMY  2017   Social History   Socioeconomic History  . Marital status: Unknown    Spouse name: Not on file  . Number of children: Not on file  . Years of education: Not on file  . Highest education level: Not on file  Occupational History  . Occupation: unemployed   Tobacco Use  . Smoking status: Never Smoker  . Smokeless tobacco: Never Used  Substance and Sexual Activity  . Alcohol use: No  . Drug use: No  . Sexual activity: Not on file  Other Topics  Concern  . Not on file  Social History Narrative  . Not on file   Social Determinants of Health   Financial Resource Strain:   . Difficulty of Paying Living Expenses: Not on file  Food Insecurity:   . Worried About Charity fundraiser in the Last Year: Not on file  . Ran Out of Food in the Last Year: Not on file  Transportation Needs:   . Lack of Transportation (Medical): Not on file  . Lack of Transportation (Non-Medical): Not on file  Physical Activity:   . Days of Exercise per Week: Not on file  . Minutes of Exercise per Session: Not on file  Stress:   . Feeling of Stress : Not on file  Social Connections:   . Frequency of Communication with Friends and Family: Not on file  . Frequency of Social Gatherings with Friends and Family: Not on file  . Attends Religious Services: Not on file  . Active Member of Clubs or Organizations: Not on file  . Attends Archivist Meetings: Not on file  . Marital Status: Not on file   Allergies  Allergen Reactions  . Latex Rash  . Shellfish Allergy Rash    Not all the time   . Amoxicillin-Pot Clavulanate     unknown   No family history on file.  Current  Outpatient Medications (Endocrine & Metabolic):  .  levothyroxine (SYNTHROID) 125 MCG tablet, Take 1 tablet (125 mcg total) by mouth every morning. 30 minutes before food  Current Outpatient Medications (Cardiovascular):  .  atenolol (TENORMIN) 100 MG tablet, Take 1 tablet by mouth once daily  Current Outpatient Medications (Respiratory):  .  albuterol (VENTOLIN HFA) 108 (90 Base) MCG/ACT inhaler, Inhale 1 puff into the lungs every 4 (four) hours. .  budesonide-formoterol (SYMBICORT) 160-4.5 MCG/ACT inhaler, Inhale into the lungs.  .  budesonide-formoterol (SYMBICORT) 80-4.5 MCG/ACT inhaler, INHALE 2 PUFFS INTO THE LUNGS 2 TIMES A DAY.   Current Outpatient Medications (Hematological):  .  cyanocobalamin (,VITAMIN B-12,) 1000 MCG/ML injection, INJECT 1ML INTO THE MUSCLE  EVERY 30 DAYS  Current Outpatient Medications (Other):  .  dexlansoprazole (DEXILANT) 60 MG capsule, Take 60 mg by mouth daily. Marland Kitchen  dicyclomine (BENTYL) 20 MG tablet, Take by mouth. .  DULoxetine (CYMBALTA) 60 MG capsule, Take 60 mg by mouth 2 (two) times daily. .  famotidine (PEPCID) 20 MG tablet, Take 20 mg by mouth daily. .  Meclizine HCl 25 MG CHEW, Chew 25 mg by mouth 3 (three) times daily with meals. .  SYRINGE-NEEDLE, DISP, 3 ML (B-D 3CC LUER-LOK SYR 25GX1") 25G X 1" 3 ML MISC, Use 1 syringe with needle monthly with B-1 2injections .  zolpidem (AMBIEN CR) 12.5 MG CR tablet, Take 1 tablet (12.5 mg total) by mouth at bedtime as needed. .  Zolpidem Tartrate 10 MG SUBL, Take by mouth.   Reviewed prior external information including notes and imaging from  primary care provider As well as notes that were available from care everywhere and other healthcare systems.  Past medical history, social, surgical and family history all reviewed in electronic medical record.  No pertanent information unless stated regarding to the chief complaint.   Review of Systems:  No headache, visual changes, nausea, vomiting, diarrhea, constipation, dizziness, abdominal pain, skin rash, fevers, chills, night sweats, weight loss, swollen lymph nodes, body aches, joint swelling, chest pain, shortness of breath, mood changes. POSITIVE muscle aches  Objective  There were no vitals taken for this visit.   General: No apparent distress alert and oriented x3 mood and affect normal, dressed appropriately.  HEENT: Pupils equal, extraocular movements intact  Respiratory: Patient's speak in full sentences and does not appear short of breath  Cardiovascular: No lower extremity edema, non tender, no erythema  Skin: Warm dry intact with no signs of infection or rash on extremities or on axial skeleton.  Abdomen: Soft nontender  Neuro: Cranial nerves II through XII are intact, neurovascularly intact in all extremities  with 2+ DTRs and 2+ pulses.  Lymph: No lymphadenopathy of posterior or anterior cervical chain or axillae bilaterally.  Gait normal with good balance and coordination.  MSK:  Non tender with full range of motion and good stability and symmetric strength and tone of shoulders, elbows, wrist, hip, knee and ankles bilaterally.     Impression and Recommendations:     This case required medical decision making of moderate complexity. The above documentation has been reviewed and is accurate and complete Lyndal Pulley, DO       Note: This dictation was prepared with Dragon dictation along with smaller phrase technology. Any transcriptional errors that result from this process are unintentional.

## 2019-12-24 NOTE — Patient Instructions (Signed)
Licensed Clinical Social Worker Visit Information Meredith [name]  it was nice speaking with you. Please call me directly if you have questions [phone] Goals we discussed today:  Goals Addressed            This Visit's Progress   . Connect with counseling        CARE PLAN ENTRY (see longtitudinal plan of care for additional care plan information)  Current Barriers/ Progress:  . Chronic Mental Health needs related to depression  . Patient reports having SI thoughts,  . Lack of sleep . Patient has therapist at [name] [phone])  Next appointment today at 1:00  Clinical Social Work Goal(s):  Marland Kitchen Over the next 30 days, patient will continue with counseling appointments and connect with psychiatry for ongoing medication elevation  Interventions: . Assessed patient's understanding, education, previous treatment and care coordination needs  . Patient interviewed and appropriate assessments performed related to SI thoughts, plan and access . Provided basic mental health support, education and interventions  . Collaborated with PCP regarding patient needs . Discussed several options for psychiatry based on need and insurance. Assisted patient with narrowing the options down to ([place] and [employer]  ) . Reviewed mental health medications with patient prescribed by PCP and discussed compliance  . Other interventions include: Crisis Doctor, hospital provided  and discussed safety plan Patient Self Care Activities & Deficits:  . Patient is unable to independently navigate community resource options without care coordination support . Patient is motivated for treatment and has support system . Patient and friend Meredith [name] will select one of the agencies from the list provided and call to schedule an appointment  Please see past updates related to this goal by clicking on the "Past Updates" button in the selected goal       Materials  provided: Verbal education about psychiatry  provided face to face Meredith [name] received Care Management services today:  1. Care Management services include personalized support from designated clinical staff supervised by her physician, including individualized plan of care and coordination with other care providers 2. 24/7 contact [phone] for assistance for urgent and routine care needs. 3. Care Management are voluntary services and be declined at any time by calling the office.  Patient  verbally agreed to assistance and services provided by embedded care coordination/care management team today.   Patient verbalizes understanding of instructions provided today.  Follow up plan:  SW will follow up with patient by phone over the next 2 days  Meredith Cane, LCSW

## 2019-12-25 ENCOUNTER — Other Ambulatory Visit: Payer: Self-pay | Admitting: Family Medicine

## 2019-12-25 ENCOUNTER — Encounter: Payer: Self-pay | Admitting: Family Medicine

## 2019-12-25 ENCOUNTER — Telehealth: Payer: Self-pay | Admitting: Family Medicine

## 2019-12-25 NOTE — Telephone Encounter (Signed)
Called pt and left VM after receiving message that she had a crises and was admitted to [facility]. Will follow up with patient 3/5 via telemedicine.

## 2019-12-26 ENCOUNTER — Encounter: Payer: Self-pay | Admitting: Family Medicine

## 2019-12-26 ENCOUNTER — Other Ambulatory Visit: Payer: Self-pay

## 2019-12-26 ENCOUNTER — Ambulatory Visit: Payer: Medicaid Other | Admitting: Licensed Clinical Social Worker

## 2019-12-26 DIAGNOSIS — R45851 Suicidal ideations: Secondary | ICD-10-CM | POA: Insufficient documentation

## 2019-12-26 DIAGNOSIS — Z7189 Other specified counseling: Secondary | ICD-10-CM

## 2019-12-26 DIAGNOSIS — F331 Major depressive disorder, recurrent, moderate: Secondary | ICD-10-CM

## 2019-12-26 NOTE — Progress Notes (Signed)
    SUBJECTIVE:   CHIEF COMPLAINT / HPI:  Meredith [name] is a 54 year old female who presents today for follow-up.  Her boyfriend daughter was also present during this encounter later on.  Suicidal ideation PHQ-9 score 20 today.  Answer to question 9 on PHQ 9 was 1. One of the first questions I a asked was if she could explain to me why she had circled the answer to question 9 as that.  She was very tearful and quiet which is not like her. Talulah is usually quite a cheerful lady. She said that she has been feeling depressed and suicidal for the last 5-6 days because she has not slept well during this time as I had not refilled her zolpidem.  I explained to her why I did not refill the zolpidem per our last conversation in clinic, we were trying to wean her off the zolpidem as she was concerned about being addicted to it.  She reported that she would be sleeping next to her boyfriend [name] who would be snoring at night and she would be very frustrated that he was asleep and she was not able to sleep.  She has had " racing thoughts" while lying in bed at night. She expressed active suicidal ideation and expressed that she wanted to shoot herself in the head with a gun.  She had a firearm in her house and she said that her boyfriend [name] has a gun locked away in a safe.  She denied any suicidal attempts.  She said that she had not spoken to [place] about any of these thoughts.  I brought social worker Neoma Laming in after my conversation with Elyce and also her boyfriend [name] who had just come back from a doctor's appointment.  Still explained to me that she was managing well on the zolpidem and that the symptoms were new after discontinuing the zolpidem.  PERTINENT  PMH / PSH: Major depressive disorder, fibromyalgia, hypertension  OBJECTIVE:   BP (!) 146/82   Pulse 74   Wt 127 lb 3.2 oz (57.7 kg)   SpO2 98%   BMI 24.84 kg/m   General: Alert, tearful, well appearing and well dressed 54 year old female  Psych: Low mood, low affect Neuro: Cranial nerves grossly intact  ASSESSMENT/PLAN:   Suicidal ideation Major depressive disorder with ACTIVE suicidal ideation with access to firearms. Following discussion with myself, Neoma Laming and [name] we came up with the following plan: -Restart Zolpidem at the same dose, refilled 7 tablets only -Follow up with therapist tomorrow at 1 PM -Follow-up with SW Neoma Laming on 3/4 -Telemedicine follow-up with myself on 3/5 at 8:45 AM -Provided patient with suicide hotline number -Boyfriend [name] will move firearms to different location and will keep a close eye on [place] this week, fortunately they live together and he a huge support system for her -[facility] Psychiatry number provided to [name] who will contact them for an appointment this week, he will then let Neoma Laming know when the appointment has been booked.        Lattie Haw, MD [place]

## 2019-12-26 NOTE — Patient Instructions (Signed)
Licensed Clinical Social Worker Visit Information [name]  it was nice speaking with you. Please call me directly if you have questions [phone] Goals we discussed today:  Goals Addressed            This Visit's Progress   . Connect with counseling   On track     [name] (see longtitudinal plan of care for additional care plan information)  Current Barriers/ Progress:  . Chronic Mental Health needs related to depression  . Patient reports having SI thoughts,  . Lack of sleep . Patient has therapist at [name] [phone])  Next appointment today at 1:00 . Patient will continue with therapist, also went to [name] unsure of follow up appointment . Sleeping better and continues to take [name] daily Clinical Social Work Goal(s):  Marland Kitchen Over Meredith next 30 days, patient will continue with counseling appointments and connect with psychiatry for ongoing medication elevation  Interventions: . Assessed patient's understanding, previous treatment and safety  . Patient interviewed assessments performed related to SI thoughts, plan and access . Provided basic mental health support, education and interventions  . Reviewed mental health medications with patient prescribed by PCP and discussed compliance  . Other interventions include: Emotional Support as well as discussed safety plan (911 and mobile crisis number provided) Patient Self Care Activities & Deficits:  . Patient is unable to independently navigate community resource options without care coordination support . Patient is motivated for treatment and has support system . Patient and friend [name] will follow-up with [name] for an appointment  Please see past updates related to this goal by clicking on [facility]eredith "Past Updates" button in Meredith selected goal       Materials provided: Yes: # to mobile crisis [phone] M[name] received Care Management services today:  1. Care  Management services include personalized support from designated clinical staff supervised by her physician, including individualized plan of care and coordination with other care providers 2. 24/7 contact [phone] for assistance for urgent and routine care needs. 3. Care Management are voluntary services and be declined at any time by calling [facility] office.  Patient  verbally agreed to assistance and services provided by embedded care coordination/care management team today.   Patient verbalizes understanding of instructions provided today.  Follow up plan: No follow up required by LCSW. Patient will contact [facility] office if needed   Maurine Cane, LCSW

## 2019-12-26 NOTE — Assessment & Plan Note (Signed)
  Major depressive disorder with ACTIVE suicidal ideation with access to firearms. Following discussion with myself, Neoma Laming and [name] we came up with the following plan: -Restart Zolpidem at the same dose, refilled 7 tablets only -Follow up with therapist tomorrow at 1 PM -Follow-up with SW Neoma Laming on 3/4 -Telemedicine follow-up with myself on 3/5 at 8:45 AM -Provided patient with suicide hotline number -Boyfriend [name] will move firearms to different location and will keep a close eye on [name] this week, fortunately they live together and he a huge support system for her -[facility] Psychiatry number provided to [name] who will contact them for an appointment this week, he will then let Neoma Laming know when the appointment has been booked.

## 2019-12-26 NOTE — Chronic Care Management (AMB) (Signed)
Care Management   Clinical Social Work Follow Up   12/26/2019 Name: Meredith [name] MRN: [id number] DOB: June 05, 1966  Referred by: Lattie Haw, MD  Reason for referral : Care Coordination (connect with psychiatry)  [name] is a 54 y.o. year old female who is a primary care patient of Lattie Haw, MD.  Reason for follow-up: Phone encounter with patient today for ongoing assessment and brief interventions to assist with assessing crisis needs.   Assessment: Reports she is feeling a little better since she has been able to get some sleep, still confused as to what happened and why she had to go [facility] to be evaluated. She denies any SI thoughts or acting on thoughts.   Review of patient status, including review of consultants reports, relevant laboratory and other test results, and collaboration with appropriate care team members and the patient's provider was performed as part of comprehensive patient evaluation and provision of care management services.    Advance Directive Status: not addressed during this encounter. SDOH (Social Determinants of Health) assessments performed: Yes SDOH Interventions     Most Recent Value  SDOH Interventions  Depression Interventions/Treatment   Currently on Treatment, Referral to Psychiatry [emotional support]      Goals Addressed            This Visit's Progress   . Connect with counseling   On track     [place] (see longtitudinal plan of care for additional care plan information)  Current Barriers/ Progress:  . Chronic Mental Health needs related to depression  . Patient reports having SI thoughts,  . Lack of sleep . Patient has therapist at [place] Coralyn Pear [phone])  Next appointment today at 1:00 . Patient will continue with therapist, also went to [facility] unsure of follow up appointment . Sleeping better and continues to take Cymbalta daily Clinical Social Work Goal(s):  Marland Kitchen Over the next 30  days, patient will continue with counseling appointments and connect with psychiatry for ongoing medication elevation  Interventions: . Assessed patient's understanding, previous treatment and safety  . Patient interviewed assessments performed related to SI thoughts, plan and access.  . Provided basic mental health support, education and interventions  . Reviewed mental health medications with patient prescribed by PCP and discussed compliance  . Other interventions include: Emotional Support as well as discussed safety plan (911 and mobile crisis number provided) Patient Self Care Activities & Deficits:  . Patient is unable to independently navigate community resource options without care coordination support . Patient is motivated for treatment and has support system . Patient and friend [name] will follow-up with [place] for an appointment  . Reminded patient of appointment with PCP 12/27/19 Please see past updates related to this goal by clicking on the "Past Updates" button in the selected goal         Outpatient Encounter Medications as of 12/26/2019  Medication Sig  . albuterol (VENTOLIN HFA) 108 (90 Base) MCG/ACT inhaler Inhale 1 puff into the lungs every 4 (four) hours.  Marland Kitchen atenolol (TENORMIN) 100 MG tablet Take 1 tablet by mouth once daily  . budesonide-formoterol (SYMBICORT) 160-4.5 MCG/ACT inhaler Inhale into the lungs.   . budesonide-formoterol (SYMBICORT) 80-4.5 MCG/ACT inhaler INHALE 2 PUFFS INTO THE LUNGS 2 TIMES A DAY.  . cyanocobalamin (,VITAMIN B-12,) 1000 MCG/ML injection INJECT 1ML INTO THE MUSCLE EVERY 30 DAYS  . dexlansoprazole (DEXILANT) 60 MG capsule Take 60 mg by mouth daily.  Marland Kitchen dicyclomine (BENTYL)  20 MG tablet Take by mouth.  . DULoxetine (CYMBALTA) 60 MG capsule Take 60 mg by mouth 2 (two) times daily.  . famotidine (PEPCID) 20 MG tablet Take 20 mg by mouth daily.  Marland Kitchen levothyroxine (SYNTHROID) 125 MCG tablet Take 1 tablet (125 mcg total) by mouth every  morning. 30 minutes before food  . Meclizine HCl 25 MG CHEW Chew 25 mg by mouth 3 (three) times daily with meals.  . SYRINGE-NEEDLE, DISP, 3 ML (B-D 3CC LUER-LOK SYR 25GX1") 25G X 1" 3 ML MISC Use 1 syringe with needle monthly with B-1 2injections  . zolpidem (AMBIEN CR) 12.5 MG CR tablet Take 1 tablet (12.5 mg total) by mouth at bedtime as needed.  . Zolpidem Tartrate 10 MG SUBL Take by mouth.   No facility-administered encounter medications on file as of 12/26/2019.   Plan:  1. Next PCP appointment scheduled for: 12/27/19 2. No further follow up required: by LCSW at this time 3. Patient will contact the office if needed  Casimer Lanius, [place] / [place]   [phone] 4:14 PM

## 2019-12-27 ENCOUNTER — Telehealth (INDEPENDENT_AMBULATORY_CARE_PROVIDER_SITE_OTHER): Payer: Self-pay | Admitting: Family Medicine

## 2019-12-27 DIAGNOSIS — F331 Major depressive disorder, recurrent, moderate: Secondary | ICD-10-CM

## 2019-12-27 NOTE — Progress Notes (Signed)
[place] Telemedicine Visit  Patient consented to have virtual visit. Method of visit: Telephone  Encounter participants: Patient: [name] - located at home Provider: Lattie Haw - located at [facility] clinic see clinic Others (if applicable): n/a  Chief Complaint: Follow up for depression  HPI:  Depression  Follow-up telephone visit following clinic visit 3 days ago.  Patient came in with suicidal ideation and poor sleep after I did not refill her Zolpidem.  PHQ-9 was 20.  Since then she was followed up by her therapist 3 days ago where she had to stop the therapy session half way because she developed a crises. She was admitted to the psychiatric hospital for further evaluiation but then discharged. Since then, [name] reports some sad news that her sister passed away 2 days ago in the [place]. It was a sudden death, the cause was unclear but she did mention sister had a lot of comorbidities including liver cirrhosis, alcohol excess, hypertension etc. Since her sister passed away she has been awake most mornings till 4 AM speaking to her family in the [place], she reported being comforting to speak to her family during this difficult time. She is very worried that she because her sister passed away so suddenly, so will she. Her mood symptoms have worsened. She says that she is" hanging in there" but feels very "moody" "sad", "hopeless" and "withdrawn".   He feels like her mind is racing.  Since restarting the Zolpidem, she has been able to sleep a little better.  Would like to continue with the zolpidem as she does not feel like she is in a place to wean off it. She feels like the Duloxetine is not helping with her mood. She denies thoughts of suicidal ideation (passive or active) thoughts of self-harm or harm to others. Protective factors have been speaking to her family in [place] and her [name] who was also present on the call. [name] reports she  has a follow up with her therapist on Monday 8th March and follow up with Psychiatry coming up.    ROS: per HPI  Pertinent PMHx: Major depressive disorder, chronic pain, fibromyalgia, gastroparesis  Exam:  Respiratory: Speaking in full sentences  Assessment/Plan:  MDD (major depressive disorder), recurrent episode, moderate ([place]) On going depression worsened this week due to acute life stressor (family bereavement). Patient denies SI, thoughts of self harm or harm to others. Discussed protective factors with patient: she feels factors that will help are speaking with her family in the [place], her partner M[name] and speak to her therapist and myself regularly.   -Psychiatry follow up next week -Continue weekly therapy sessions -Virtual appointments booked with me for the 11th and 17th March. -Continue Duloxetine at current dose -Continue Zolpidem, will prescribed more tablets to last the month. Will plan to wean patient later this year. Pt is happy with this plan. -Recommended adjunctive therapies: gentle exercise, journalling thoughts, meditation/breathing exercises, aromatherapy etc. -Reiterated suicide hotline number to patient and partner     Time spent during visit with patient: 30 minutes

## 2019-12-29 MED ORDER — ZOLPIDEM TARTRATE ER 12.5 MG PO TBCR: 12.5000 mg | EXTENDED_RELEASE_TABLET | Freq: Every evening | ORAL | 0 refills | Status: DC | PRN

## 2019-12-29 NOTE — Assessment & Plan Note (Addendum)
On going depression worsened this week due to acute life stressor (family bereavement). Patient denies SI, thoughts of self harm or harm to others. Discussed protective factors with patient: she feels factors that will help are speaking with her family in the [place], her partner [name] and speak to her therapist and myself regularly.   -Psychiatry follow up next week -Continue weekly therapy sessions -Virtual appointments booked with me for the 11th and 17th March. -Continue Duloxetine at current dose -Continue Zolpidem, will prescribed more tablets to last the month. Will plan to wean patient later this year. Pt is happy with this plan. -Recommended adjunctive therapies: gentle exercise, journalling thoughts, meditation/breathing exercises, aromatherapy etc. -Reiterated suicide hotline number to patient and partner

## 2020-01-02 ENCOUNTER — Telehealth (INDEPENDENT_AMBULATORY_CARE_PROVIDER_SITE_OTHER): Payer: Self-pay | Admitting: Family Medicine

## 2020-01-02 ENCOUNTER — Other Ambulatory Visit: Payer: Self-pay

## 2020-01-02 DIAGNOSIS — F5101 Primary insomnia: Secondary | ICD-10-CM

## 2020-01-02 DIAGNOSIS — F331 Major depressive disorder, recurrent, moderate: Secondary | ICD-10-CM

## 2020-01-02 NOTE — Progress Notes (Signed)
[place] Telemedicine Visit  Patient consented to have virtual visit. Method of visit: Telephone  Encounter participants: Patient: [name] - located at home  Provider: Lattie Haw - located at [facility]  Others (if applicable):   Chief Complaint: Depression f/u  HPI: M[name] is a 54 yr old female who presents for depression f/u. [name] also present on the call  Depression Pt said that she is feeling "ok" and "dealing with the grief'. She feels a lot better compared to last week but still feels sad at times and has mood swings. Has been Skyping family in [place]. Denies suicidal thoughts. [name] says gun is still in house but locked in safe. She has and appointment at 31st March at [facility].   Insomnia Managing very well on zolpidem, sleeping. Does not want to come off and "cant manage without it".  ROS: per HPI  Pertinent PMHx: Anxiety, Depression, Fibromyaglia  Exam:  Respiratory: speaking in full sentences   Assessment/Plan:  MDD (major depressive disorder), recurrent episode, moderate ([place]) On going depression managed on Duloxetine. Denies SI, thoughts of self harm or harm to others. -Continue Duloxetine  -FU with OP Pyshclater this month  Insomnia Insomnia well controlled on Zolpidem -Continue Zolpidem -Consider weaning when patient is in a more robust place physically and psychologically.    Time spent during visit with patient: 20 minutes

## 2020-01-05 DIAGNOSIS — G4700 Insomnia, unspecified: Secondary | ICD-10-CM | POA: Insufficient documentation

## 2020-01-05 NOTE — Assessment & Plan Note (Signed)
On going depression managed on Duloxetine. Denies SI, thoughts of self harm or harm to others. -Continue Duloxetine  -FU with OP Pyshclater this month

## 2020-01-05 NOTE — Assessment & Plan Note (Signed)
Insomnia well controlled on Zolpidem -Continue Zolpidem -Consider weaning when patient is in a more robust place physically and psychologically.

## 2020-01-07 ENCOUNTER — Encounter: Payer: Self-pay | Admitting: Family Medicine

## 2020-01-08 ENCOUNTER — Telehealth (INDEPENDENT_AMBULATORY_CARE_PROVIDER_SITE_OTHER): Payer: Self-pay | Admitting: Family Medicine

## 2020-01-08 ENCOUNTER — Other Ambulatory Visit: Payer: Self-pay

## 2020-01-08 DIAGNOSIS — F331 Major depressive disorder, recurrent, moderate: Secondary | ICD-10-CM

## 2020-01-08 DIAGNOSIS — I951 Orthostatic hypotension: Secondary | ICD-10-CM

## 2020-01-08 NOTE — Progress Notes (Signed)
[place] Telemedicine Visit  Patient consented to have virtual visit. Method of visit: telephone  Encounter participants: Patient: [name] - located at home Provider: Lattie Haw - located at [facility] Others (if applicable): [name], boyfriend  Chief Complaint: Depression  HPI:  Depression "Still dealing with depression". Mentioned that she has "good days and bad days but mostly bad days at the moment".  Has moments of anger where she tries to let it all out. Speaks to family in [place] which is helping her cope with her sister's death. Denies suicidal thoughts, thoughts of self harm or harm to others. Waiting for Psychiatric assessment on March 31st. Continues to Cymbalta..   Dizziness Was on the treadmill yesterday and walking 'fast" with physical therapy. She suddenly became dizzy and started eeing stars. Denies chest pain or SOB. She had down which relieved her symptoms. Reports that is  "happens on and off". Reports drinking enough water that day. Boyfriend (works as Neurosurgeon) did lying and standing Bps and there was a postural drop of 56mmHg. Currently taking Atenolol for HTN started by previous PCP.  ROS: per HPI  Pertinent PMHx: Depression, fibromyalgia   Exam:  Respiratory: speaking in dull sentences Assessment/Plan:  MDD (major depressive disorder), recurrent episode, moderate ([place]) On going depression managed with Cymbalta. Pt remains low suicide risk. Denies SI, thoughts of self harm or harm to others. -Continue Cymbalta -FU with Psychiatry later in March.  Orthostatic hypotension Likely episode of orthostasis. Recommended that patient gets up slowly, and does not exert herself too much with PT. Recommended keeping BP diary at home and to arrange appointment for BP management in the clinic. Advise given that if she develops SOB, chest pain on exertion that she should go to the ER.    Time spent during visit with patient: 20  minutes

## 2020-01-10 ENCOUNTER — Other Ambulatory Visit: Payer: Self-pay

## 2020-01-11 DIAGNOSIS — I951 Orthostatic hypotension: Secondary | ICD-10-CM | POA: Insufficient documentation

## 2020-01-11 NOTE — Assessment & Plan Note (Addendum)
Likely episode of orthostasis. Recommended that patient gets up slowly, and does not exert herself too much with PT. Recommended keeping BP diary at home and to arrange appointment for BP management in the clinic. Advise given that if she develops SOB, chest pain on exertion that she should go to the ER.

## 2020-01-11 NOTE — Assessment & Plan Note (Signed)
On going depression managed with Cymbalta. Pt remains low suicide risk. Denies SI, thoughts of self harm or harm to others. -Continue Cymbalta -FU with Psychiatry later in March.

## 2020-01-13 ENCOUNTER — Ambulatory Visit (INDEPENDENT_AMBULATORY_CARE_PROVIDER_SITE_OTHER): Payer: Self-pay | Admitting: Dermatology

## 2020-01-13 ENCOUNTER — Other Ambulatory Visit: Payer: Self-pay

## 2020-01-13 ENCOUNTER — Ambulatory Visit (INDEPENDENT_AMBULATORY_CARE_PROVIDER_SITE_OTHER): Payer: Self-pay | Admitting: Gastroenterology

## 2020-01-13 ENCOUNTER — Encounter: Payer: Self-pay | Admitting: Gastroenterology

## 2020-01-13 VITALS — BP 159/91 | HR 71 | Temp 97.7°F | Ht 60.0 in | Wt 129.0 lb

## 2020-01-13 DIAGNOSIS — L209 Atopic dermatitis, unspecified: Secondary | ICD-10-CM

## 2020-01-13 DIAGNOSIS — K581 Irritable bowel syndrome with constipation: Secondary | ICD-10-CM

## 2020-01-13 DIAGNOSIS — K3184 Gastroparesis: Secondary | ICD-10-CM

## 2020-01-13 DIAGNOSIS — R7989 Other specified abnormal findings of blood chemistry: Secondary | ICD-10-CM

## 2020-01-13 DIAGNOSIS — L905 Scar conditions and fibrosis of skin: Secondary | ICD-10-CM

## 2020-01-13 DIAGNOSIS — R140 Abdominal distension (gaseous): Secondary | ICD-10-CM

## 2020-01-13 MED ORDER — HYDROXYZINE HCL 25 MG PO TABS: 25.0000 mg | ORAL_TABLET | Freq: Three times a day (TID) | ORAL | 1 refills | Status: DC | PRN

## 2020-01-13 NOTE — Progress Notes (Signed)
   Follow-Up Visit   Subjective  M[name] is a 54 y.o. female who presents for the following: Allergic Contact Derm vs Atopic Derm (legs, neck, face using Clobetasol cr legs/neck prn, Elidel to face prn, Hydroxyzine 25mg  1 po qhs, xyzal qam) and recheck Benign Irritated Nevus removed at last visit (post neck).  Patch test done in office was negative.  Pt has h/o seasonal allergies and chronic uritcaria.   The following portions of the chart were reviewed this encounter and updated as appropriate:     Review of Systems: No other skin or systemic complaints.  Objective  Well appearing patient in no apparent distress; mood and affect are within normal limits.  A focused examination was performed including face, legs, arms, posterior neck. Relevant physical exam findings are noted in the Assessment and Plan.  Objective  arms, legs, face, nose: Scaly erythematous papules and patches +/- dyspigmentation, lichenification, excoriations. More severe on lower legs and upper lip  Objective  Neck - Posterior: Pink scar 2ndary to shave biopsy proven irritated nevus   Assessment & Plan  Atopic dermatitis, unspecified type arms, legs, face, nose  Pending approval free drug program, pt to start Dupixent Cont Elidel cr to aa face prn flares Cont Clobetasol cr bid to legs, avoid face, groin, axilla Cont Eucerin cr qd/bid Increase Hydroxyzine 25mg  1po tid prn  D/c Xyzal Discussed possible Allergy referral for pt c/o itching after eating certain foods.  hydrOXYzine (ATARAX/VISTARIL) 25 MG tablet - arms, legs, face, nose  Scar Neck - Posterior  Benign, observe.    Return in about 5 weeks (around 02/17/2020) for AD - dupixent start?Marland Kitchen

## 2020-01-13 NOTE — Progress Notes (Signed)
Cephas Darby, MD [address]  [place]  [place], [place] [address]  Main: [phone]  Fax: [phone]    Gastroenterology Consultation  Referring Provider:     Lattie Haw, MD Primary Care Physician:  Lattie Haw, MD Primary Gastroenterologist:  Dr. Cephas Darby Reason for Consultation: Gastroparesis and chronic constipation        HPI:   Meredith [name] is a 54 y.o. female referred by Dr. Lattie Haw, MD  for consultation & management of gastroparesis and chronic constipation. Patient transferred her care from [place] gastro[facility]ology Dr. Delle Reining to [facility] as she moved to [facility] with her boyfriend. Patient is diagnosed with gastroparesis after work-up of nausea, vomiting, diarrhea, gastric emptying study which revealed significantly abnormal delayed gastric emptying.  Patient was taking Reglan which resulted in twitching of her face, therefore she stopped.  She is currently on Dexilant in the morning and famotidine at bedtime.  She is still struggling with gastroparesis symptoms, nausea and vomiting despite eating small portions.  However, her weight has been stable Her hemoglobin A1c was 6.6 in 2018 Food allergy profile revealed allergy to milk, nuts, hazelnuts and eggs  She also has chronic constipation, diagnosed with irritable bowel syndrome mixed type, started on Motegrity by her previous GI.  Apparently, patient is taking motility as needed only.  She does have infrequent bowel movements associated with abdominal bloating  Elevated LFTs: Patient had secondary liver disease work-up in the past negative for viral hepatitis A, B and C, normal ANA, anti-smooth muscle and antimitochondrial antibodies.  Ultrasound Doppler negative for portal vein thrombosis.  MRI/MRCP was unremarkable except for fatty liver.  Most recently, her serum ferritin levels were 555 She moved from [place] in 2012, denies any recent travel She does not smoke or drink  alcohol  Follow-up visit 01/13/2020 Patient is concerned about abdominal bloating today.  She continues to take [place] and Motegrity.  She is on gastroparesis diet.  She underwent liver biopsy for elevated LFTs which was unremarkable.  Secondary liver disease work-up is negative.  She has history of cholecystectomy in 2017  NSAIDs: None  Antiplts/Anticoagulants/Anti thrombotics: None  GI Procedures: EGD 10/12/2017 1) Duodenum, Mucosal Biopsy: - Duodenal mucosa with overall intact villous architecture. - No significant intraepithelial lymphocytosis seen  2) Gastric Mucosal Biopsy: -  Mild chronic, inactive gastritis. -  An immunohistochemical stain for H.pylori is negative with satisfactory positive control.  Colonoscopy 12/26/2017, 11/30/2017, report not available  Upper GI with small bowel follow-through 01/03/2018 FINDINGS: Mild intermittent gastroesophageal reflux. Normal primary stripping wave.   The stomach, duodenal bulb and rest of the duodenal sweep appear normal.  . Small bowel is normal in caliber. Normal fold pattern. Normal transit, with contrast reaching the colon 2 hours. Terminal ileum is not well visualized secondary to overlying bowel loops but is grossly normal.  Past Medical History:  Diagnosis Date  . Anemia   . Anxiety   . Asthma   . Chronic insomnia   . Depression   . Dermatitis   . Gastroparesis   . GERD (gastroesophageal reflux disease)   . Hypertension   . Hypothyroidism   . Rhinitis     Past Surgical History:  Procedure Laterality Date  . APPENDECTOMY    . CHOLECYSTECTOMY  2017    Current Outpatient Medications:  .  albuterol (VENTOLIN HFA) 108 (90 Base) MCG/ACT inhaler, Inhale 1 puff into the lungs every 4 (four) hours., Disp: , Rfl:  .  cyanocobalamin (,VITAMIN B-12,)  1000 MCG/ML injection, INJECT 1ML INTO THE MUSCLE EVERY 30 DAYS, Disp: , Rfl:  .  dexlansoprazole (DEXILANT) 60 MG capsule, Take 60 mg by mouth daily., Disp: , Rfl:  .   DULoxetine (CYMBALTA) 60 MG capsule, Take 60 mg by mouth 2 (two) times daily., Disp: , Rfl:  .  famotidine (PEPCID) 20 MG tablet, Take 20 mg by mouth daily., Disp: , Rfl:  .  hydrOXYzine (ATARAX/VISTARIL) 25 MG tablet, Take 1 tablet (25 mg total) by mouth 3 (three) times daily as needed. May make drowsey, Disp: 90 tablet, Rfl: 1 .  levothyroxine (SYNTHROID) 125 MCG tablet, Take 1 tablet (125 mcg total) by mouth every morning. 30 minutes before food, Disp: 90 tablet, Rfl: 3 .  Meclizine HCl 25 MG CHEW, Chew 25 mg by mouth 3 (three) times daily with meals., Disp: , Rfl:  .  SYRINGE-NEEDLE, DISP, 3 ML (B-D 3CC LUER-LOK SYR 25GX1") 25G X 1" 3 ML MISC, Use 1 syringe with needle monthly with B-1 2injections, Disp: , Rfl:  .  zolpidem (AMBIEN CR) 12.5 MG CR tablet, Take 1 tablet (12.5 mg total) by mouth at bedtime as needed., Disp: 30 tablet, Rfl: 0   History reviewed. No pertinent family history.   Social History   Tobacco Use  . Smoking status: Never Smoker  . Smokeless tobacco: Never Used  Substance Use Topics  . Alcohol use: No  . Drug use: No    Allergies as of 01/13/2020 - Review Complete 01/13/2020  Allergen Reaction Noted  . Latex Rash 10/20/2014  . Shellfish allergy Rash 05/13/2016  . Amoxicillin-pot clavulanate  09/02/2011    Review of Systems:    All systems reviewed and negative except where noted in HPI.   Physical Exam:  BP (!) 159/91 (BP Location: Left Arm, Patient Position: Sitting, Cuff Size: Normal)   Pulse 71   Temp 97.7 F (36.5 C) (Oral)   Ht 5' (1.524 m)   Wt 129 lb (58.5 kg)   BMI 25.19 kg/m  No LMP recorded. Patient is postmenopausal.  General:   Alert,  Well-developed, well-nourished, pleasant and cooperative in NAD Head:  Normocephalic and atraumatic. Eyes:  Sclera clear, no icterus.   Conjunctiva pink. Ears:  Normal auditory acuity. Nose:  No deformity, discharge, or lesions. Mouth:  No deformity or lesions,oropharynx pink & moist. Neck:  Supple; no  masses or thyromegaly. Lungs:  Respirations even and unlabored.  Clear throughout to auscultation.   No wheezes, crackles, or rhonchi. No acute distress. Heart:  Regular rate and rhythm; no murmurs, clicks, rubs, or gallops. Abdomen:  Normal bowel sounds. Soft, non-tender and non-distended without masses, hepatosplenomegaly or hernias noted.  No guarding or rebound tenderness.   Rectal: Not performed Msk:  Symmetrical without gross deformities. Good, equal movement & strength bilaterally. Pulses:  Normal pulses noted. Extremities:  No clubbing or edema.  No cyanosis. Neurologic:  Alert and oriented x3;  grossly normal neurologically. Skin:  Intact without significant lesions or rashes. No jaundice. Psych:  Alert and cooperative. Normal mood and affect.  Imaging Studies: Reviewed  Assessment and Plan:   Meredith [name] is a 54 y.o. Filipino female with prediabetes is seen in consultation for gastroparesis and chronic constipation.  Patient is concerned about abdominal bloating today  Abdominal bloating Trial of IBgard, if it does not work, will try 2 weeks course of rifaximin 550 mg twice daily  Gastroparesis, in remission Continue Dexilant and Pepcid Continue gastroparesis diet, information provided Patient did not tolerate Reglan in  the past which has resulted in twitching of her face hemoglobin A1c is 6.4, H. 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

## 2020-01-14 ENCOUNTER — Encounter: Payer: Self-pay | Admitting: Family Medicine

## 2020-01-15 ENCOUNTER — Encounter: Payer: Self-pay | Admitting: Gastroenterology

## 2020-01-16 ENCOUNTER — Encounter: Payer: Self-pay | Admitting: Family Medicine

## 2020-01-20 ENCOUNTER — Encounter: Payer: Self-pay | Admitting: Family Medicine

## 2020-01-21 ENCOUNTER — Telehealth: Payer: Self-pay

## 2020-01-21 NOTE — Telephone Encounter (Signed)
[name], patients boyfriend, calls nurse line requesting medical clearance for patient to continue PT at [facility]. There are messages regarding this via mychart. Patient has been having elevated blood pressures, she does have an apt with you 4/13 for BP. [name] wants to know if she continue PT, or wait until BP apt with you. Please advise.

## 2020-01-21 NOTE — Telephone Encounter (Signed)
Hi Paige, I have now responded to this message. She should stop PT until she has medical clearance by a doctor. Thank you

## 2020-01-22 NOTE — Telephone Encounter (Signed)
Meredith [name] contacted and informed. They are fine with waiting until mid April apt with PCP to be cleared.

## 2020-01-27 ENCOUNTER — Other Ambulatory Visit: Payer: Self-pay

## 2020-01-27 ENCOUNTER — Encounter: Payer: Self-pay | Admitting: Family Medicine

## 2020-01-28 MED ORDER — "BD LUER-LOK SYRINGE 25G X 1"" 3 ML MISC": 6 refills | Status: AC

## 2020-01-28 MED ORDER — CYANOCOBALAMIN 1000 MCG/ML IJ SOLN: INTRAMUSCULAR | 3 refills | Status: AC

## 2020-02-02 ENCOUNTER — Encounter: Payer: Self-pay | Admitting: Family Medicine

## 2020-02-03 ENCOUNTER — Other Ambulatory Visit: Payer: Self-pay | Admitting: Family Medicine

## 2020-02-03 MED ORDER — ZOLPIDEM TARTRATE ER 12.5 MG PO TBCR: 12.5000 mg | EXTENDED_RELEASE_TABLET | Freq: Every evening | ORAL | 0 refills | Status: DC | PRN

## 2020-02-04 ENCOUNTER — Encounter: Payer: Self-pay | Admitting: Family Medicine

## 2020-02-04 ENCOUNTER — Other Ambulatory Visit: Payer: Self-pay | Admitting: Family Medicine

## 2020-02-04 ENCOUNTER — Telehealth: Payer: Self-pay

## 2020-02-04 ENCOUNTER — Ambulatory Visit (INDEPENDENT_AMBULATORY_CARE_PROVIDER_SITE_OTHER): Payer: Self-pay | Admitting: Family Medicine

## 2020-02-04 ENCOUNTER — Ambulatory Visit (INDEPENDENT_AMBULATORY_CARE_PROVIDER_SITE_OTHER): Payer: Medicaid Other | Admitting: Family Medicine

## 2020-02-04 ENCOUNTER — Other Ambulatory Visit: Payer: Self-pay

## 2020-02-04 VITALS — BP 108/82 | HR 73 | Ht 60.0 in

## 2020-02-04 DIAGNOSIS — M999 Biomechanical lesion, unspecified: Secondary | ICD-10-CM

## 2020-02-04 DIAGNOSIS — G8929 Other chronic pain: Secondary | ICD-10-CM

## 2020-02-04 DIAGNOSIS — I10 Essential (primary) hypertension: Secondary | ICD-10-CM

## 2020-02-04 DIAGNOSIS — M549 Dorsalgia, unspecified: Secondary | ICD-10-CM

## 2020-02-04 MED ORDER — ZOLPIDEM TARTRATE ER 12.5 MG PO TBCR: 12.5000 mg | EXTENDED_RELEASE_TABLET | Freq: Every evening | ORAL | 0 refills | Status: DC | PRN

## 2020-02-04 NOTE — Telephone Encounter (Signed)
[name] calls nurse line stating Suzie Portela has no record of Ambien rx called in yesterday. Per chart review, looks like the rx was set to "print." I can not give verbal for this, as it is controlled. [name] was very adamant on having this fixed before their apt this afternoon.

## 2020-02-04 NOTE — Progress Notes (Signed)
[place] Phone: [phone] Subjective:    I'm seeing this patient by the request  of:  Lattie Haw, MD  CC: Chronic pain follow-up  [id number]   11/26/2019 Patient has had chronic radiculopathy previously but negative straight leg test noted today.  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  This could be contributing to some of the discomfort.  I do believe that fibromyalgia is likely a consistent diagnosis as well.  Patient will start with formal physical therapy which I think will be beneficial.  Patient's laboratory work-up though did show elevation in liver enzymes and I would like to recheck at this point.  In addition to this we will check patient's calcium level that was elevated at 10.4 with an ionized calcium and a PTH.  See if anything else is abnormal at this time.  This could maybe help [place] direct some of the medical management.  Attempted osteopathic manipulation follow-up again in 4 weeks.  Update 02/04/2020 Meredith [name] is a 54 y.o. female coming in with complaint of cervical and back pain. Patient states that she continues to have radiculopathy in arms and legs. Has discontinued PT due to pain and blood pressure issues.  Patient states that unfortunately continues to have significant discomfort overall.  Starts to affect daily activities and is unable to work secondary to her pain.  Patient is seeing other providers to discuss if that she is a candidate for disability.    Past Medical History:  Diagnosis Date  . Anemia   . Anxiety   . Asthma   . Chronic insomnia   . Depression   . Dermatitis   . Gastroparesis   . GERD (gastroesophageal reflux disease)   . Hypertension   . Hypothyroidism   . Rhinitis    Past Surgical History:  Procedure Laterality Date  . APPENDECTOMY    . CHOLECYSTECTOMY  2017   Social History   Socioeconomic History  . Marital status: Unknown   Spouse name: Not on file  . Number of children: Not on file  . Years of education: Not on file  . Highest education level: Not on file  Occupational History  . Occupation: unemployed   Tobacco Use  . Smoking status: Never Smoker  . Smokeless tobacco: Never Used  Substance and Sexual Activity  . Alcohol use: No  . Drug use: No  . Sexual activity: Not on file  Other Topics Concern  . Not on file  Social History Narrative  . Not on file   Social Determinants of Health   Financial Resource Strain:   . Difficulty of Paying Living Expenses:   Food Insecurity:   . Worried About Charity fundraiser in the Last Year:   . Arboriculturist in the Last Year:   Transportation Needs:   . Film/video editor (Medical):   Marland Kitchen Lack of Transportation (Non-Medical):   Physical Activity:   . Days of Exercise per Week:   . Minutes of Exercise per Session:   Stress: Stress Concern Present  . Feeling of Stress : Rather much  Social Connections:   . Frequency of Communication with Friends and Family:   . Frequency of Social Gatherings with Friends and Family:   . Attends Religious Services:   . Active Member of Clubs or Organizations:   . Attends Archivist Meetings:   Marland Kitchen Marital Status:    Allergies  Allergen Reactions  .  Latex Rash  . Shellfish Allergy Rash    Not all the time   . Amoxicillin-Pot Clavulanate     unknown   No family history on file.  Current Outpatient Medications (Endocrine & Metabolic):  .  levothyroxine (SYNTHROID) 125 MCG tablet, Take 1 tablet (125 mcg total) by mouth every morning. 30 minutes before food   Current Outpatient Medications (Respiratory):  .  albuterol (VENTOLIN HFA) 108 (90 Base) MCG/ACT inhaler, Inhale 1 puff into the lungs every 4 (four) hours.   Current Outpatient Medications (Hematological):  .  cyanocobalamin (,VITAMIN B-12,) 1000 MCG/ML injection, INJECT 1ML INTO THE MUSCLE EVERY 30 DAYS  Current Outpatient Medications (Other):   .  dexlansoprazole (DEXILANT) 60 MG capsule, Take 60 mg by mouth daily. .  DULoxetine (CYMBALTA) 60 MG capsule, Take 60 mg by mouth 2 (two) times daily. .  famotidine (PEPCID) 20 MG tablet, Take 20 mg by mouth daily. .  hydrOXYzine (ATARAX/VISTARIL) 25 MG tablet, Take 1 tablet (25 mg total) by mouth 3 (three) times daily as needed. May make drowsey .  Meclizine HCl 25 MG CHEW, Chew 25 mg by mouth 3 (three) times daily with meals. .  SYRINGE-NEEDLE, DISP, 3 ML (B-D 3CC LUER-LOK SYR 25GX1") 25G X 1" 3 ML MISC, Use 1 syringe with needle monthly with B-1 2injections .  zolpidem (AMBIEN CR) 12.5 MG CR tablet, Take 1 tablet (12.5 mg total) by mouth at bedtime as needed.   Reviewed prior external information including notes and imaging from  primary care provider As well as notes that were available from care everywhere and other healthcare systems.  Past medical history, social, surgical and family history all reviewed in electronic medical record.  No pertanent information unless stated regarding to the chief complaint.   Review of Systems:  No , visual changes, nausea, vomiting, diarrhea, constipation, dizziness, abdominal pain, skin rash, fevers, chills, night sweats, weight loss, swollen lymph nodes, joint swelling, chest pain, shortness of breath, mood changes. POSITIVE muscle aches, body aches, headaches  Objective  Blood pressure 108/82, pulse 73, height 5' (1.524 m), SpO2 99 %.   General: No apparent distress alert and oriented x3 mood and affect normal, dressed appropriately.  HEENT: Pupils equal, extraocular movements intact  Respiratory: Patient's speak in full sentences and does not appear short of breath  Cardiovascular: No lower extremity edema, non tender, no erythema  Neuro: Cranial nerves II through XII are intact, neurovascularly intact in all extremities with 2+ DTRs and 2+ pulses.  Gait normal with good balance and coordination.  MSK: Patient is tender to palpation in all  soft tissue areas.  Patient continues to have discomfort and pain overall.  Seems to be more muscular.  Patient does have voluntary guarding noted.  Osteopathic findings  C6 flexed rotated and side bent left T7 extended rotated and side bent right inhaled rib L2 flexed rotated and side bent right Sacrum right on right     Impression and Recommendations:     This case required medical decision making of moderate complexity. The above documentation has been reviewed and is accurate and complete Lyndal Pulley, DO       Note: This dictation was prepared with Dragon dictation along with smaller phrase technology. Any transcriptional errors that result from this process are unintentional.

## 2020-02-04 NOTE — Telephone Encounter (Signed)
I can confirm that I have sent her meds to the pharmacy. Thank you!

## 2020-02-04 NOTE — Assessment & Plan Note (Signed)
Chronic problem with worsening symptoms.  Patient has multiple different problems that are likely contributing to some of this on a regular basis.  Discussed icing regimen, home exercise, which activities to doing which wants to avoid.  Discussed medications.  Patient has seen primary care provider and they are discussing the possibility of changing the Cymbalta.  I would suggest other medications that will help with some of the other underlying problems.  Social determinants of health including financial constraints.  Makes it difficult and is probably 1 reason why patient did stop formal physical therapy.  Follow-up with me again in 4 to 8 weeks otherwise.

## 2020-02-04 NOTE — Patient Instructions (Signed)
See me again in 6 weeks 

## 2020-02-04 NOTE — Patient Instructions (Signed)
Hi Mrs. Menza,  It was lovely to see you today!  Today we spoke about weaning you off the Atenolol.  For the next week please reduce the dose of atenolol to 50 mg for the next 7 days.  After 7 days, please reduce the dose to 25 mg and continue this for 7 days.  After 7 days of finished please stop the atenolol.  Please look out for any signs of new headaches, visual changes or heart racing.  Please make sure you check your blood pressures at home twice a day and bring them with you to the next visit.  I will then consider starting you on a new blood pressure medication next month if your blood pressures are still high.  I will contact your psychiatrist to find out the plan for your depression and PTSD.  Please follow-up with me in May.  Best wishes and take care  Dr. Posey Pronto

## 2020-02-04 NOTE — Assessment & Plan Note (Signed)

## 2020-02-05 NOTE — Telephone Encounter (Signed)
Checking to see if this was ever sent as normal and not print or if you contacted pharmacy and called in the Rx to be filled.Meredith [name], CMA

## 2020-02-07 NOTE — Telephone Encounter (Signed)
Removed this order so this could be closed.  Pt had appointment with PCP this week.Zyron Deeley Zimmerman Rumple, CMA

## 2020-02-08 NOTE — Assessment & Plan Note (Addendum)
Blood pressure soft today in clinic 114/62. Latarsha agreeable to weaning off Atenolol and monitoring BP off medication especially as she may be experiencing orthostatic hypotension. If Bps are elevated with consider starting calcium channel block/Ace/ARB in the future. Recommended 50mg  Atenolol daily for the next 7 days, 25mg  for the following 7 days and then stopping atenolol. Counseled patient on symptoms of rebound HTN such as headache, palpitations etc.  -Follow up with me next month for BP follow up

## 2020-02-08 NOTE — Progress Notes (Signed)
    SUBJECTIVE:   CHIEF COMPLAINT / HPI:   Meredith [name] is a 54 yr old female who presents today for a blood pressure follow up. [name] also present.  HTN Currently takes Atenolol 100mg  started by previous PCP. Denies chest pain, SOB or peripheral edema. Sometimes experiences dizziness when standing up too quickly. Has been keeping blood pressure diary but left it at home today. Home Bps vary from 123456 systolic, however has had a few elevated readings with physical therapy [id number] systolic. I recommended not to do physical therapy until Meredith [name] comes in for BP review at [facility].   PERTINENT  PMH / PSH: fibromyalgia, gastroparesis  OBJECTIVE:   BP 114/62   Pulse 77   Ht 5' (1.524 m)   Wt 57.6 kg   SpO2 99%   BMI 24.80 kg/m    General: pleasant, well appearing female, no acute distress Cardio: Normal S1 and S2, RRR. No murmurs or rubs.   Pulm: CTAB, normal WOB  Abdomen: Bowel sounds normal. Abdomen soft and non-tender.  Extremities: No peripheral edema. Warm/ well perfused.  Strong radial pulse. Neuro: Cranial nerves grossly intact  ASSESSMENT/PLAN:   Essential hypertension Blood pressure soft today in clinic 114/62. Meredith [name] agreeable to weaning off Atenolol and monitoring BP off medication especially as she may be experiencing orthostatic hypotension. If Bps are elevated with consider starting calcium channel block/Ace/ARB in the future. Recommended 50mg  Atenolol daily for the next 7 days, 25mg  for the following 7 days and then stopping atenolol. Counseled patient on symptoms of rebound HTN such as headache, palpitations etc.  -Follow up with me next month for BP follow up    Lattie Haw, MD [place]

## 2020-02-18 ENCOUNTER — Ambulatory Visit: Payer: Medicaid Other | Admitting: Dermatology

## 2020-02-18 ENCOUNTER — Encounter: Payer: Self-pay | Admitting: Gastroenterology

## 2020-02-18 ENCOUNTER — Telehealth: Payer: Self-pay

## 2020-02-18 NOTE — Telephone Encounter (Signed)
-----   Message from Lin Landsman, MD sent at 02/18/2020  1:32 PM EDT ----- Regarding: [facility] referral Please refer patient to [facility] for hydrogen breath test as well as sucrose and fructose intolerance test Diagnosis: Chronic abdominal bloating  ThanksRohini Vanga

## 2020-02-18 NOTE — Telephone Encounter (Signed)
Referred patient to [facility] for Hydrogen breath test. Sent Fax

## 2020-02-20 ENCOUNTER — Other Ambulatory Visit: Payer: Self-pay

## 2020-02-21 ENCOUNTER — Telehealth: Payer: Self-pay | Admitting: *Deleted

## 2020-02-21 MED ORDER — LEVOTHYROXINE SODIUM 125 MCG PO TABS: 125.0000 ug | ORAL_TABLET | ORAL | 3 refills | Status: DC

## 2020-02-21 NOTE — Telephone Encounter (Signed)
Received fax from [facility] Dept.  asking if pts Rx for Symbicort had been discontinued. It said that patient informed them it had and I did not see this on current med list.  Please confirm. Salimata Christenson Zimmerman Rumple, CMA

## 2020-03-01 ENCOUNTER — Other Ambulatory Visit: Payer: Self-pay | Admitting: Family Medicine

## 2020-03-01 ENCOUNTER — Encounter: Payer: Self-pay | Admitting: Gastroenterology

## 2020-03-02 ENCOUNTER — Other Ambulatory Visit: Payer: Self-pay | Admitting: Family Medicine

## 2020-03-04 ENCOUNTER — Encounter: Payer: Self-pay | Admitting: Family Medicine

## 2020-03-05 ENCOUNTER — Other Ambulatory Visit: Payer: Self-pay | Admitting: Family Medicine

## 2020-03-05 ENCOUNTER — Ambulatory Visit: Payer: Medicaid Other | Admitting: Family Medicine

## 2020-03-05 ENCOUNTER — Other Ambulatory Visit: Payer: Self-pay

## 2020-03-05 MED ORDER — ZOLPIDEM TARTRATE ER 12.5 MG PO TBCR: 12.5000 mg | EXTENDED_RELEASE_TABLET | Freq: Every evening | ORAL | 0 refills | Status: DC | PRN

## 2020-03-11 ENCOUNTER — Encounter: Payer: Self-pay | Admitting: Family Medicine

## 2020-03-11 ENCOUNTER — Other Ambulatory Visit: Payer: Self-pay

## 2020-03-11 ENCOUNTER — Ambulatory Visit (INDEPENDENT_AMBULATORY_CARE_PROVIDER_SITE_OTHER): Payer: Self-pay | Admitting: Family Medicine

## 2020-03-11 VITALS — BP 126/70 | HR 103 | Ht 60.0 in | Wt 128.0 lb

## 2020-03-11 DIAGNOSIS — Z131 Encounter for screening for diabetes mellitus: Secondary | ICD-10-CM

## 2020-03-11 DIAGNOSIS — E039 Hypothyroidism, unspecified: Secondary | ICD-10-CM

## 2020-03-11 DIAGNOSIS — I10 Essential (primary) hypertension: Secondary | ICD-10-CM

## 2020-03-11 DIAGNOSIS — Z87898 Personal history of other specified conditions: Secondary | ICD-10-CM

## 2020-03-11 LAB — POCT GLYCOSYLATED HEMOGLOBIN (HGB A1C): HbA1c, POC (controlled diabetic range): 6.6 % (ref 0.0–7.0)

## 2020-03-11 NOTE — Progress Notes (Signed)
    SUBJECTIVE:   CHIEF COMPLAINT / HPI:   Meredith [name] is a 54 yr old female who presents today for BP follow up.  Partner [name] also present at visit  Hypertension Patient was seen at previous visit to wean off atenolol placed on by previous provider prior to coming to [facility]. Patient has now successfully weaned off atenolol without side effects. Has been taking blood pressures at home twice a day, but today forgot blood pressure diary at home.  Blood pressures in the [id number] to 123456 systolic.  Patient sometimes becomes focused on blood pressures at home and it stresses her out, especially the higher readings. Blood pressures in clinic have been 126/70 and 114/62 previous visit.  Denies chest pain, shortness of breath or dizziness. Sometimes has palpitations.  Hypothyroidism TSH level was 0.27 in December.  Patient reports ongoing fatigue, excessive hair loss, feeling excessively cold and weight gain.  Compliant with levothyroxine 125 mcg daily.  Prediabetes Last A1c in January 2021 was 6.4.  Reports drinking a lot of water and passing urine a lot.  It is unclear whether she actually has polyuria and polydipsia due to patient being pan-positive of symptoms.  Also reports ongoing blurred vision and fatigue.   PERTINENT  PMH / PSH: Gastroparesis, fibromyalgia, major depression, anxiety  OBJECTIVE:   BP 126/70   Pulse (!) 103   Ht 5' (1.524 m)   Wt 128 lb (58.1 kg)   SpO2 99%   BMI 25.00 kg/m    General: Alert, pleasant, no acute distress Cardio: Normal S1 and S2, RRR Pulm: CTAB, normal WOB  Abdomen: Bowel sounds normal. Abdomen soft and non-tender.  Extremities: No peripheral edema. Warm/ well perfused.  Neuro: Cranial nerves grossly intact  ASSESSMENT/PLAN:   Hypothyroidism Will check TSH today as last TSH 0.27 5 months ago. Will make dose adjustments for Synthroid if necessary.  History of prediabetes Last A1c was borderline at 6.4 in Jan 2021. Will recheck today.  Essential  hypertension Blood pressures today 126/70. Despite home elevated readings has been normotensive in clinic. Considered ambulatory blood pressure monitoring but no indication at present. For now will bring pt in for BP monitoring every few months. Can consider Ace/ARB in the future if becomes hypertensive.      Lattie Haw, MD [place]

## 2020-03-11 NOTE — Patient Instructions (Signed)
Ms Underkoffler,  It was great to see you today!  Glad you are keeping well! the blood pressures that I have seen in clinic have been normal.  I am glad we were able to wean you off the atenolol.  At present I do not think you need to start any blood pressure medications.  I will however investigate a blood pressure monitoring system so we can see the average of your blood pressures over a period of time.  We also checking your diabetes and thyroid levels today.  I will be in touch with the results.  Please feel free to come back into the clinic when you feel ready so we can speak about weaning you off the zolpidem and discussed your anxiety.  Best wishes,  Dr. Posey Pronto

## 2020-03-12 LAB — TSH: TSH: 0.689 u[IU]/mL (ref 0.450–4.500)

## 2020-03-14 DIAGNOSIS — Z87898 Personal history of other specified conditions: Secondary | ICD-10-CM | POA: Insufficient documentation

## 2020-03-14 NOTE — Assessment & Plan Note (Addendum)
Will check TSH today as last TSH 0.27 5 months ago. Will make dose adjustments for Synthroid if necessary.

## 2020-03-14 NOTE — Assessment & Plan Note (Signed)
Blood pressures today 126/70. Despite home elevated readings has been normotensive in clinic. Considered ambulatory blood pressure monitoring but no indication at present. For now will bring pt in for BP monitoring every few months. Can consider Ace/ARB in the future if becomes hypertensive.

## 2020-03-14 NOTE — Assessment & Plan Note (Addendum)
Last A1c was borderline at 6.4 in Jan 2021. Will recheck today.

## 2020-03-16 ENCOUNTER — Encounter: Payer: Self-pay | Admitting: Family Medicine

## 2020-03-19 ENCOUNTER — Other Ambulatory Visit: Payer: Self-pay

## 2020-03-19 ENCOUNTER — Ambulatory Visit: Payer: Medicaid Other | Admitting: Family Medicine

## 2020-03-19 NOTE — Telephone Encounter (Signed)
Dawn, with the [employer], calls nurse line requesting an updated prescription with PCP name. The patient has transitioned to [facility] department pharmacy. Dawn requests Motegrity 2mg  PO daily. This medication is for patients constipation. I advised Dawn this is not on current med list, will forward to PCP.

## 2020-03-24 ENCOUNTER — Ambulatory Visit: Payer: Medicaid Other | Admitting: Family Medicine

## 2020-03-24 ENCOUNTER — Other Ambulatory Visit: Payer: Self-pay

## 2020-03-24 ENCOUNTER — Encounter: Payer: Self-pay | Admitting: Family Medicine

## 2020-03-24 ENCOUNTER — Ambulatory Visit (INDEPENDENT_AMBULATORY_CARE_PROVIDER_SITE_OTHER): Payer: Self-pay | Admitting: Family Medicine

## 2020-03-24 VITALS — BP 164/82 | HR 92 | Ht 60.0 in | Wt 127.0 lb

## 2020-03-24 DIAGNOSIS — I10 Essential (primary) hypertension: Secondary | ICD-10-CM

## 2020-03-24 MED ORDER — LOSARTAN POTASSIUM 25 MG PO TABS: 25.0000 mg | ORAL_TABLET | Freq: Every day | ORAL | 1 refills | Status: AC

## 2020-03-24 NOTE — Progress Notes (Signed)
   SUBJECTIVE:   CHIEF COMPLAINT / HPI:   Hypertension: - Medications: was previously on atenolol, but weaned off this and then was not restarted due to normal BP in office however patient and husband upset because they have noticed BP elevated at home and nothing has been done.   - Checking BP at home: yes, machine brought in today which shows elevated BP up to 160's over 100's with no low BP recorded.  - Denies any SOB, CP, vision changes, LE edema, medication SEs, or symptoms of hypotension. Having headaches periodically that she attributes to her BP.   PERTINENT  PMH / PSH: HTN, migrines, hypothyroidism, fibromyalgia  OBJECTIVE:  BP (!) 164/82   Pulse 92   Ht 5' (1.524 m)   Wt 127 lb (57.6 kg)   SpO2 99%   BMI 24.80 kg/m   General: NAD, pleasant Neck: Supple Cardiovascular: RRR Respiratory: normal work of breathing Psych: AOx3, appropriate affect  ASSESSMENT/PLAN:   Essential hypertension BP elevated in office and on home machine. Will obtain BMP and start patient on losartan 25mg . Follow up in 2 weeks for repeat BMP. Strict return precautions and patient voiced understanding.      [place] Arinze Rivadeneira, DO PGY-3, [facility]

## 2020-03-24 NOTE — Patient Instructions (Signed)
Thank you for coming to see me today. It was a pleasure! Today we talked about:   For your blood pressure, I have started you on a medication called losartan.  We will obtain electrolyte and kidney function today and then would like to have this repeated in 2 to 4 weeks.  You should take this at night prior to going to bed but it is most important that you take it at least daily.  Please continue to check your blood pressures regularly and let us know if you have continued headaches.  We will release your results on MyChart and call you if anything is abnormal.  Your headaches, you may also try naproxen or Aleve.  This is safe to take with Tylenol.  If you develop any chest pain, shortness of breath or worsening headache that does not resolve then I recommend being seen by a physician or going to the emergency room after hours.  Please follow-up with our clinic in 2 weeks or sooner as needed.  If you have any questions or concerns, please do not hesitate to call the office at [phone].  Take Care,   [place] Tiphani Mells, DO

## 2020-03-25 ENCOUNTER — Telehealth: Payer: Self-pay | Admitting: Family Medicine

## 2020-03-25 LAB — LIPID PANEL
Chol/HDL Ratio: 3.4 ratio (ref 0.0–4.4)
Cholesterol, Total: 201 mg/dL — ABNORMAL HIGH (ref 100–199)
HDL: 60 mg/dL (ref >39)
LDL Chol Calc (NIH): 124 mg/dL — ABNORMAL HIGH (ref 0–99)
Triglycerides: 95 mg/dL (ref 0–149)
VLDL Cholesterol Cal: 17 mg/dL (ref 5–40)

## 2020-03-25 LAB — BASIC METABOLIC PANEL
BUN/Creatinine Ratio: 12 (ref 9–23)
BUN: 6 mg/dL (ref 6–24)
CO2: 28 mmol/L (ref 20–29)
Calcium: 10 mg/dL (ref 8.7–10.2)
Chloride: 99 mmol/L (ref 96–106)
Creatinine, Ser: 0.5 mg/dL — ABNORMAL LOW (ref 0.57–1.00)
GFR calc Af Amer: 126 mL/min/{1.73_m2} (ref >59)
GFR calc non Af Amer: 109 mL/min/{1.73_m2} (ref >59)
Glucose: 97 mg/dL (ref 65–99)
Potassium: 3.9 mmol/L (ref 3.5–5.2)
Sodium: 142 mmol/L (ref 134–144)

## 2020-03-25 NOTE — Telephone Encounter (Signed)
I request that patient has her GI refill this medication for her as she has started her on it. Please let me know if there are any issues. Thank you.

## 2020-03-25 NOTE — Telephone Encounter (Signed)
Contacted patient regarding blood pressure and lab results. Pt states she was seen yesterday by Dr Enid Derry who started her on Losartan which she will start taking today. Recommended measures Bps at home once daily if she would like or if she is symptomatic with headaches, changes in vision or dizziness on standing. Also recommended going to ED if BP >180/120 with symptoms. Will follow up with patient in 2 weeks regarding BP, HLD and diabetes management. Pt is happy with above plan.

## 2020-03-26 NOTE — Telephone Encounter (Signed)
Informed Dawn of the below message.  Routing to PCP to refuse the medication so we can close this encounter.Meredith [name], CMA

## 2020-03-26 NOTE — Assessment & Plan Note (Signed)
BP elevated in office and on home machine. Will obtain BMP and start patient on losartan 25mg . Follow up in 2 weeks for repeat BMP. Strict return precautions and patient voiced understanding.

## 2020-04-06 NOTE — Progress Notes (Signed)
° ° °  SUBJECTIVE:   CHIEF COMPLAINT / HPI:  Meredith [name] is a 54 yr old female who presents today for HTN follow up and medication refill. Pt's partner [name] was present at the time of visit.   Blood pressure  Started on losartan 25mg  a few weeks ago for persistently high blood pressures. BMP at that time was normal. Pt is tolerating medication well and she denies chest pain, palpitations, dyspnea, dizziness or edema. Has been keeping BP diary at home which remain elevated: 152-163 SBP and 87-96 DBP which pt is concerned about. BP in clinic visits have revealed normal Bps.  Diabetes  A1c 6.6 on 03/11/20. Denies polyuria, polydipsia or blurred vision. Would prefer to try diet and exercise prior to starting medication such as metformin. She has a lot of GI issues and is seen by GI. She has investigations at [facility] coming up for chronic bloating. 24 hr food recall: bagel, coffee, hot dog, rice, banana, berries and boost shakes. Eats small portions of food and feels full easily.    PERTINENT  PMH / PSH: Depression, fibromyalgia, gastroparesis   OBJECTIVE:   BP 112/80    Pulse (!) 104    Ht 5' (1.524 m)    Wt 125 lb 2 oz (56.8 kg)    SpO2 98%    BMI 24.44 kg/m   General: Alert, no acute distress, pleasant Cardio: Normal S1 and S2, RRR Pulm: CTAB, normal WOB  Abdomen: Bowel sounds normal. Abdomen soft and non-tender.  Extremities: No peripheral edema. Warm/ well perfused.  Strong radial pulse Neuro: Cranial nerves grossly intact  ASSESSMENT/PLAN:   Essential hypertension BP 112/80 today and at goal however high in 017-494W systolic at home. I suspect the home machine may not be accurate. Will continue Losartan 25mg  for now and patient will follow up with me for BP check. Will consider increasing if Bps are elevated >140/80. Repeat BMP today.  Diabetes mellitus without complication ([place]) H6P in May was 6.6. We spoke about potentially starting Metformin today and discussed it's side effects.  Patient has chronic GI issues such as gastroparesis and sees GI. She also has studies at [facility] coming up to investigate bloating. We decided to check her A1c in 2 months and in the meantime make some diet and exercise changes. Went through principles of DM diet and provided hand out. Pt was happy with this plan.   Hypercholesteremia Elevated LDL at 124. Considered and discussed starting statin today. Discussed side effects such as myalgias and patient was not too keen on starting it today due to already experiencing significant pain with her fibromyalgia. We discussed lifestyle changes and we can consider starting a statin/other medication in a few months.      Lattie Haw, MD [place]

## 2020-04-07 ENCOUNTER — Ambulatory Visit (INDEPENDENT_AMBULATORY_CARE_PROVIDER_SITE_OTHER): Payer: Self-pay | Admitting: Family Medicine

## 2020-04-07 ENCOUNTER — Other Ambulatory Visit: Payer: Self-pay

## 2020-04-07 VITALS — BP 112/80 | HR 104 | Ht 60.0 in | Wt 125.1 lb

## 2020-04-07 DIAGNOSIS — I10 Essential (primary) hypertension: Secondary | ICD-10-CM

## 2020-04-07 DIAGNOSIS — E119 Type 2 diabetes mellitus without complications: Secondary | ICD-10-CM | POA: Insufficient documentation

## 2020-04-07 DIAGNOSIS — E7800 Pure hypercholesterolemia, unspecified: Secondary | ICD-10-CM | POA: Insufficient documentation

## 2020-04-07 LAB — POCT UA - GLUCOSE/PROTEIN
Glucose, UA: NEGATIVE
Protein, UA: NEGATIVE

## 2020-04-07 MED ORDER — ZOLPIDEM TARTRATE ER 12.5 MG PO TBCR: 12.5000 mg | EXTENDED_RELEASE_TABLET | Freq: Every evening | ORAL | 1 refills | Status: AC | PRN

## 2020-04-07 NOTE — Assessment & Plan Note (Signed)
A1c in May was 6.6. We spoke about potentially starting Metformin today and discussed it's side effects. Patient has chronic GI issues such as gastroparesis and sees GI. She also has studies at [facility] coming up to investigate bloating. We decided to check her A1c in 2 months and in the meantime make some diet and exercise changes. Went through principles of DM diet and provided hand out. Pt was happy with this plan.

## 2020-04-07 NOTE — Patient Instructions (Addendum)
  Diet Recommendations for Diabetes   Starchy (carb) foods: Bread, rice, pasta, potatoes, corn, cereal, grits, crackers, bagels, muffins, all baked goods.  (Fruits, milk, and yogurt also have carbohydrate, but most of these foods will not spike your blood sugar as most starchy foods will.)  A few fruits do cause high blood sugars; use small portions of bananas (limit to 1/2 at a time), grapes, watermelon, oranges, and most tropical fruits.    Protein foods: Meat, fish, poultry, eggs, dairy foods, and beans such as pinto and kidney beans (beans also provide carbohydrate).   1. Eat at least 3 meals and 1-2 snacks per day. Never go more than 4-5 hours while awake without eating. Eat breakfast within the first hour of getting up.   2. Limit starchy foods to TWO per meal and ONE per snack. ONE portion of a starchy  food is equal to the following:   - ONE slice of bread (or its equivalent, such as half of a hamburger bun).   - 1/2 cup of a "scoopable" starchy food such as potatoes or rice.   - 15 grams of Total Carbohydrate as shown on food label.  3. Include at every meal: a protein food, a carb food, and vegetables and/or fruit.   - Obtain twice the volume of veg's as protein or carbohydrate foods for both lunch and dinner.   - Fresh or frozen veg's are best.   - Keep frozen veg's on hand for a quick vegetable serving.       Great to see you today!! You are doing great and should be proud of the progress you have made. Your cholesterol is a little high and we will consider starting a medication for your cholesterol later in the year. For now try and implement the above lifestyle changes. We will also check your diabetes level in 2 months and consider starting you on metformin. Your BP looks great. Lets continue on the same dose of losartan for now. We are checking your kidney function today which is a routine test after starting losartan.  Best wishes   Dr Posey Pronto

## 2020-04-07 NOTE — Assessment & Plan Note (Signed)
Elevated LDL at 124. Considered and discussed starting statin today. Discussed side effects such as myalgias and patient was not too keen on starting it today due to already experiencing significant pain with her fibromyalgia. We discussed lifestyle changes and we can consider starting a statin/other medication in a few months.

## 2020-04-07 NOTE — Assessment & Plan Note (Signed)
BP 112/80 today and at goal however high in 384-665L systolic at home. I suspect the home machine may not be accurate. Will continue Losartan 25mg  for now and patient will follow up with me for BP check. Will consider increasing if Bps are elevated >140/80. Repeat BMP today.

## 2020-04-08 ENCOUNTER — Other Ambulatory Visit: Payer: Self-pay

## 2020-04-08 ENCOUNTER — Encounter: Payer: Self-pay | Admitting: Family Medicine

## 2020-04-08 LAB — BASIC METABOLIC PANEL
BUN/Creatinine Ratio: 18 (ref 9–23)
BUN: 10 mg/dL (ref 6–24)
CO2: 22 mmol/L (ref 20–29)
Calcium: 9.8 mg/dL (ref 8.7–10.2)
Chloride: 96 mmol/L (ref 96–106)
Creatinine, Ser: 0.57 mg/dL (ref 0.57–1.00)
GFR calc Af Amer: 121 mL/min/{1.73_m2} (ref >59)
GFR calc non Af Amer: 105 mL/min/{1.73_m2} (ref >59)
Glucose: 127 mg/dL — ABNORMAL HIGH (ref 65–99)
Potassium: 4.1 mmol/L (ref 3.5–5.2)
Sodium: 138 mmol/L (ref 134–144)

## 2020-04-08 MED ORDER — MOTEGRITY 2 MG PO TABS: 2.0000 mg | ORAL_TABLET | Freq: Every day | ORAL | 1 refills | Status: AC

## 2020-04-08 NOTE — Telephone Encounter (Signed)
Last office visit 01/13/2020 abdominal bloating IBS elevated LFT  Last refill 05/10/2019 historical medication

## 2020-04-14 ENCOUNTER — Ambulatory Visit (INDEPENDENT_AMBULATORY_CARE_PROVIDER_SITE_OTHER): Payer: Self-pay | Admitting: Family Medicine

## 2020-04-14 ENCOUNTER — Other Ambulatory Visit: Payer: Self-pay

## 2020-04-14 ENCOUNTER — Encounter: Payer: Self-pay | Admitting: Family Medicine

## 2020-04-14 VITALS — BP 128/92 | HR 106 | Ht 60.0 in | Wt 124.0 lb

## 2020-04-14 DIAGNOSIS — E559 Vitamin D deficiency, unspecified: Secondary | ICD-10-CM

## 2020-04-14 DIAGNOSIS — M999 Biomechanical lesion, unspecified: Secondary | ICD-10-CM

## 2020-04-14 DIAGNOSIS — M797 Fibromyalgia: Secondary | ICD-10-CM

## 2020-04-14 DIAGNOSIS — E538 Deficiency of other specified B group vitamins: Secondary | ICD-10-CM

## 2020-04-14 DIAGNOSIS — M549 Dorsalgia, unspecified: Secondary | ICD-10-CM

## 2020-04-14 DIAGNOSIS — M4810 Ankylosing hyperostosis [Forestier], site unspecified: Secondary | ICD-10-CM

## 2020-04-14 DIAGNOSIS — G8929 Other chronic pain: Secondary | ICD-10-CM

## 2020-04-14 NOTE — Progress Notes (Signed)
[facility] [place] Phone: [phone] Subjective:   Meredith Meredith [name], am serving as a scribe for Dr. Hulan [name]. This visit occurred during the SARS-CoV-2 public health emergency.  Safety protocols were in place, including screening questions prior to the visit, additional usage of staff PPE, and extensive cleaning of exam room while observing appropriate contact time as indicated for disinfecting solutions.   I'm seeing this patient by the request  of:  Meredith Haw, MD  CC: Allover pain follow-up  EQA:[id number]  Meredith Meredith [name] is a 54 y.o. female coming in with complaint of back and neck pain. OMT 02/04/2020. Patient states that her neck pain is worse recently. R>L numbness and tingling down to her hands. Not taking and medications.   Medications patient has been prescribed: B12 injections which patient is not taking at the moment.  Taking: Cymbalta which patient unfortunately is only taking occasionally         Reviewed prior external information including notes and imaging from previsou exam, outside providers and external EMR if available.   As well as notes that were available from care everywhere and other healthcare systems.  Past medical history, social, surgical and family history all reviewed in electronic medical record.  Meredith [name] pertanent information unless stated regarding to the chief complaint.   Past Medical History:  Diagnosis Date  . Anemia   . Anxiety   . Asthma   . Chronic insomnia   . Depression   . Dermatitis   . Gastroparesis   . GERD (gastroesophageal reflux disease)   . Hypertension   . Hypothyroidism   . Rhinitis     Allergies  Allergen Reactions  . Latex Rash  . Shellfish Allergy Rash    Not all the time   . Amoxicillin-Pot Clavulanate     unknown     Review of Systems:  Meredith [name]  visual changes, nausea, vomiting, diarrhea, constipation, dizziness, abdominal pain, skin rash,  fevers, chills, night sweats, weight loss, swollen lymph nodes,  joint swelling, chest pain, shortness of breath, mood changes. POSITIVE muscle aches, body aches, headache  Objective  Blood pressure (!) 128/92, pulse (!) 106, height 5' (1.524 m), weight 124 lb (56.2 kg), SpO2 99 %.   General: Meredith [name] apparent distress alert and oriented x3 mood and affect normal, dressed appropriately.  HEENT: Pupils equal, extraocular movements intact  Respiratory: Patient's speak in full sentences and does not appear short of breath  Cardiovascular: Meredith [name] lower extremity edema, non tender, Meredith [name] erythema  Neuro: Cranial nerves II through XII are intact, neurovascularly intact in all extremities with 2+ DTRs and 2+ pulses.  Gait normal with good balance and coordination.  MSK:  Non tender with full range of motion and good stability and symmetric strength and tone of shoulders, elbows, wrist, hip, knee and ankles bilaterally.  Back -diffuse tenderness to even light palpation.  Seems to be out of proportion to the amount of pressure.  Negative straight leg test.  Patient does have some very limited range of motion with may be some mild weakness of the right foot noted.  Discomfort with FABER test bilaterally. Osteopathic findings  C2 flexed rotated and side bent right C7 flexed rotated and side bent left T3 extended rotated and side bent right inhaled rib T8 extended rotated and side bent left L2 flexed rotated and side bent right Sacrum right on right       Assessment and Plan:  Chronic back pain Chronic  back pain.  Exacerbation.  Patient does not seem to be responding as well to anything else.  Patient is seen multiple different providers and has had epidurals with minimal improvement.  Patient is responding a little bit to the osteopathic manipulation which is a little encouraging at this moment.  Discussed with patient though about icing regimen, home exercises, which activities to do which wants to avoid.   Discussed the importance of the B12.  Patient is following up with GI for gastroparesis-could be contributing to some of his pain as well.  Follow-up again in 4 to 8 weeks  Vitamin B12 deficiency Encourage supplementation.  Vitamin D deficiency Encourage supplementation    Nonallopathic problems  Decision today to treat with OMT was based on Physical Exam  After verbal consent patient was treated with HVLA, ME, FPR techniques in cervical, rib, thoracic, lumbar, and sacral  areas  Patient tolerated the procedure well with improvement in symptoms  Patient given exercises, stretches and lifestyle modifications  See medications in patient instructions if given  Patient will follow up in 4-8 weeks      The above documentation has been reviewed and is accurate and complete Meredith Pulley, DO       Note: This dictation was prepared with Dragon dictation along with smaller phrase technology. Any transcriptional errors that result from this process are unintentional.

## 2020-04-14 NOTE — Patient Instructions (Signed)
B12 shots every other week for 3 weeks then 1 monthly See me back in 6 weeks

## 2020-04-14 NOTE — Assessment & Plan Note (Signed)
Chronic back pain.  Exacerbation.  Patient does not seem to be responding as well to anything else.  Patient is seen multiple different providers and has had epidurals with minimal improvement.  Patient is responding a little bit to the osteopathic manipulation which is a little encouraging at this moment.  Discussed with patient though about icing regimen, home exercises, which activities to do which wants to avoid.  Discussed the importance of the B12.  Patient is following up with GI for gastroparesis-could be contributing to some of his pain as well.  Follow-up again in 4 to 8 weeks

## 2020-04-14 NOTE — Assessment & Plan Note (Signed)
Encourage supplementation 

## 2020-04-30 ENCOUNTER — Encounter: Payer: Self-pay | Admitting: Gastroenterology

## 2020-04-30 DIAGNOSIS — R140 Abdominal distension (gaseous): Secondary | ICD-10-CM

## 2020-05-06 ENCOUNTER — Other Ambulatory Visit: Payer: Self-pay | Admitting: *Deleted

## 2020-05-07 MED ORDER — DULOXETINE HCL 60 MG PO CPEP: 60.0000 mg | ORAL_CAPSULE | Freq: Two times a day (BID) | ORAL | 6 refills | Status: DC

## 2020-05-11 NOTE — Telephone Encounter (Signed)
Sent referral to [facility]

## 2020-05-22 MED ORDER — RIFAXIMIN 550 MG PO TABS
550.0000 mg | ORAL_TABLET | Freq: Three times a day (TID) | ORAL | 0 refills | Status: AC
Start: 2020-05-22 — End: 2020-06-05

## 2020-05-22 NOTE — Addendum Note (Signed)
Addended by: Ulyess Blossom L on: 05/22/2020 09:54 AM   Modules accepted: Orders

## 2020-05-22 NOTE — Telephone Encounter (Signed)
Sent medication to the pharmacy  

## 2020-05-22 NOTE — Addendum Note (Signed)
Addended by: Ulyess Blossom L on: 05/22/2020 08:11 AM   Modules accepted: Orders

## 2020-05-25 ENCOUNTER — Other Ambulatory Visit: Payer: Self-pay | Admitting: Family Medicine

## 2020-05-25 MED ORDER — ZOLPIDEM TARTRATE ER 12.5 MG PO TBCR: 12.5000 mg | EXTENDED_RELEASE_TABLET | Freq: Every evening | ORAL | 0 refills | Status: DC | PRN

## 2020-05-27 ENCOUNTER — Other Ambulatory Visit: Payer: Self-pay

## 2020-05-27 ENCOUNTER — Encounter: Payer: Self-pay | Admitting: Family Medicine

## 2020-05-27 ENCOUNTER — Ambulatory Visit (INDEPENDENT_AMBULATORY_CARE_PROVIDER_SITE_OTHER): Payer: Medicaid Other | Admitting: Family Medicine

## 2020-05-27 VITALS — BP 130/80 | HR 115 | Ht 60.0 in | Wt 123.0 lb

## 2020-05-27 DIAGNOSIS — M5417 Radiculopathy, lumbosacral region: Secondary | ICD-10-CM | POA: Diagnosis not present

## 2020-05-27 DIAGNOSIS — M999 Biomechanical lesion, unspecified: Secondary | ICD-10-CM | POA: Diagnosis not present

## 2020-05-27 DIAGNOSIS — F411 Generalized anxiety disorder: Secondary | ICD-10-CM

## 2020-05-27 NOTE — Patient Instructions (Addendum)
Good to see you Meredith [name]  Much better See me again in 6 weeks

## 2020-05-27 NOTE — Assessment & Plan Note (Signed)
Seems to be a little improved since patient did get a rolling on her disability

## 2020-05-27 NOTE — Progress Notes (Signed)
[facility] [address] [place] Phone: [phone] Subjective:   I Meredith [name] am serving as a Education administrator for Dr. Hulan Saas.  This visit occurred during the SARS-CoV-2 public health emergency.  Safety protocols were in place, including screening questions prior to the visit, additional usage of staff PPE, and extensive cleaning of exam room while observing appropriate contact time as indicated for disinfecting solutions.   I'm seeing this patient by the request  of:  Lattie Haw, MD  CC: Neck pain and allover pain follow-up  OIN:[id number]  Meredith [name] is a 55 y.o. female coming in with complaint of back and neck pain. OMT 04/14/2020. Patient states she has bilateral numbness and tingling in the finger tips. Left is worse. Patient is right hand dominant. Warm sensation with shooting pain.  Patient states that she continues to have this discomfort and pain from time to time.  Patient though has noticed that it does seem to get worse with some of the anxiety.  Patient denies any numbness that is continuous that  Medications patient has been prescribed: Cymbalta  Taking: Yes         Reviewed prior external information including notes and imaging from previsou exam, outside providers and external EMR if available.   As well as notes that were available from care everywhere and other healthcare systems.  Past medical history, social, surgical and family history all reviewed in electronic medical record.  No pertanent information unless stated regarding to the chief complaint.   Past Medical History:  Diagnosis Date  . Anemia   . Anxiety   . Asthma   . Chronic insomnia   . Depression   . Dermatitis   . Gastroparesis   . GERD (gastroesophageal reflux disease)   . Hypertension   . Hypothyroidism   . Rhinitis     Allergies  Allergen Reactions  . Latex Rash  . Shellfish Allergy Rash    Not all the time   . Amoxicillin-Pot  Clavulanate     unknown     Review of Systems:  No headache, visual changes, nausea, vomiting, diarrhea, constipation, dizziness,, skin rash, fevers, chills, night sweats, weight loss, swollen lymph nodes, , joint swelling, chest pain, shortness of breath, mood changes. POSITIVE muscle aches, abdominal pain, body aches  Objective  Blood pressure 130/80, pulse (!) 115, height 5' (1.524 m), weight 123 lb (55.8 kg), SpO2 98 %.   General: No apparent distress alert and oriented x3 mood and affect normal, dressed appropriately.  HEENT: Pupils equal, extraocular movements intact  Respiratory: Patient's speak in full sentences and does not appear short of breath  Cardiovascular: No lower extremity edema, non tender, no erythema  Neuro: Cranial nerves II through XII are intact, neurovascularly intact in all extremities with 2+ DTRs and 2+ pulses.  Gait normal with good balance and coordination.  MSK: Diffuse tenderness to palpation that is out of proportion to the amount of palpation. Low back exam with loss of lordosis.  Tightness with all range of motion noted.  Mild voluntary guarding noted.  Mild increase in tightness between the shoulder blades as well right greater than left  Osteopathic findings  C2 flexed rotated and side bent right C6 flexed rotated and side bent left T3 extended rotated and side bent right inhaled rib T9 extended rotated and side bent left L2 flexed rotated and side bent right Sacrum right on right       Assessment and Plan:  Lumbosacral radiculopathy at L5 Known radiculopathy.  Continuing to have difficulty.  Discussed the Cymbalta again at this time.  Continue the other medications.  Responding fairly well to manipulation.  No change in management at this time.  Multiple different underlying problems that are contributing.  We will continue this medications including things such as the Cymbalta and we will hold on any other type of medications at this time.   Follow-up again in 4 to 8 weeks  GAD (generalized anxiety disorder) Seems to be a little improved since patient did get a rolling on her disability   Nonallopathic problems  Decision today to treat with OMT was based on Physical Exam  After verbal consent patient was treated with HVLA, ME, FPR techniques in cervical, rib, thoracic, lumbar, and sacral  areas  Patient tolerated the procedure well with improvement in symptoms  Patient given exercises, stretches and lifestyle modifications  See medications in patient instructions if given  Patient will follow up in 4-8 weeks      The above documentation has been reviewed and is accurate and complete Lyndal Pulley, DO       Note: This dictation was prepared with Dragon dictation along with smaller phrase technology. Any transcriptional errors that result from this process are unintentional.

## 2020-05-27 NOTE — Assessment & Plan Note (Addendum)
Known radiculopathy.  Continuing to have difficulty.  Discussed the Cymbalta again at this time.  Continue the other medications.  Responding fairly well to manipulation.  No change in management at this time.  Multiple different underlying problems that are contributing.  We will continue this medications including things such as the Cymbalta and we will hold on any other type of medications at this time.  Follow-up again in 4 to 8 weeks

## 2020-05-29 ENCOUNTER — Ambulatory Visit: Payer: Medicaid Other | Admitting: Family Medicine

## 2020-05-29 ENCOUNTER — Telehealth: Payer: Self-pay

## 2020-05-29 NOTE — Telephone Encounter (Signed)
The referral was denied for [facility]. They state that you can Email at [email or web address] and do a appeal and explained why patient needs to be seen.

## 2020-06-01 ENCOUNTER — Other Ambulatory Visit: Payer: Self-pay

## 2020-06-01 ENCOUNTER — Ambulatory Visit (INDEPENDENT_AMBULATORY_CARE_PROVIDER_SITE_OTHER): Payer: Medicaid Other | Admitting: Dermatology

## 2020-06-01 DIAGNOSIS — Z79899 Other long term (current) drug therapy: Secondary | ICD-10-CM

## 2020-06-01 DIAGNOSIS — L409 Psoriasis, unspecified: Secondary | ICD-10-CM | POA: Diagnosis not present

## 2020-06-01 MED ORDER — PIMECROLIMUS 1 % EX CREA: TOPICAL_CREAM | Freq: Two times a day (BID) | CUTANEOUS | 3 refills | Status: DC

## 2020-06-01 MED ORDER — OTEZLA 30 MG PO TABS: 30.0000 mg | ORAL_TABLET | Freq: Two times a day (BID) | ORAL | 12 refills | Status: DC

## 2020-06-01 MED ORDER — OTEZLA 30 MG PO TABS
30.0000 mg | ORAL_TABLET | Freq: Two times a day (BID) | ORAL | 5 refills | Status: DC
Start: 2020-06-01 — End: 2020-11-23

## 2020-06-01 NOTE — Progress Notes (Signed)
   Follow-Up Visit   Subjective  Meredith [name] is a 54 y.o. female who presents for the following: Atopic derm (face, legs, elbows, f/u, Dupixent injections q 2wks, still itching, pt out of Elidel, Clobetasol, and Hydroxyzine ).  She has not been seen since [place] initially prescribed due to missed appointments.  Rash on legs has gotten worse and is very itchy.  She also has new rash on elbows and scalp.  No prior h/o psoriasis.  She has a h/o seasonal allergies.   The following portions of the chart were reviewed this encounter and updated as appropriate:      Review of Systems:  No other skin or systemic complaints except as noted in HPI or Assessment and Plan.  Objective  Well appearing patient in no apparent distress; mood and affect are within normal limits.  A focused examination was performed including scalp, face, arms, legs, back. Relevant physical exam findings are noted in the Assessment and Plan.  Objective  bil legs, face, elbows: Well demarcated erythematous  scaly patch bil pretibial, well demarcated pink macule L med canthus/upper eyelid, erythema of the nose with mild lichenification upper lip, mild erythema and scale bil elbows, mild scaling frontal scalp. BSA 6%    Images             Assessment & Plan  Psoriasis bil legs, face, elbows  Worsening of rash on Dupixent- now clinically c/w psoriasis, not atopic dermatitis D/c Dupixent  Start Otezla 30mg  1 po qd/bid titrating up as directed and as tolerated. Instructions and Samples given Lot [id number] 10/2021 x 2  Start Enstilar foam qd/bid aa psoriasis trade sample given Lot [id number] 11/2020  Restart Elidel cr qd/bid aa face and body prn flares  May restart Hydroxyzine 25mg  1 po hs prn itching, may make drowsey Cont Cerave cream qd  Side effects of Otezla (apremilast) include diarrhea, nausea, headache, upper respiratory infection, depression, and weight decrease (5-10%). It should only be taken by  pregnant women after a discussion regarding risks and benefits with their doctor.  Topical steroids (such as triamcinolone, fluocinolone, fluocinonide, mometasone, clobetasol, halobetasol, betamethasone, hydrocortisone) can cause thinning and lightening of the skin if they are used for too long in the same area. Your physician has selected the right strength medicine for your problem and area affected on the body. Please use your medication only as directed by your physician to prevent side effects.    Apremilast (OTEZLA) 30 MG TABS - bil legs, face, elbows  Apremilast (OTEZLA) 30 MG TABS - bil legs, face, elbows  pimecrolimus (ELIDEL) 1 % cream - bil legs, face, elbows  Return in about 1 month (around 07/02/2020) for Psoriasis.  Long term medication management.  Documentation: I have reviewed the above documentation for accuracy and completeness, and I agree with the above.  Brendolyn Patty MD

## 2020-06-01 NOTE — Patient Instructions (Addendum)
Enstilar foam apply to affected areas of psoriasis daily as needed  Otezla  Day 1 10mg  1 time a day Day 2 10mg  1 time a day Day 3 10mg  2 times a day Day 4 10mg  2 times a day Day 5 10 mg in am and 20mg  in pm Day 6 10mg  in am and 20mg  in pm Day 7 20mg  in am and 20mg  in pm Day 8 20mg  in am and 20mg  in pm Day 9 20mg  in am and 30mg  in pm Day 10 20mg  in am and 30mg  in pm Day 11 and on  30mg  in am and 30mg  in pm

## 2020-06-01 NOTE — Telephone Encounter (Signed)
I put that in the referral they denied the referral. They said that you could e-mail at the e-mail address and try to do a appeal. They said in the e-mail put a description on why she needs to be seen at there clinic and your phone number because they will call and discuss everything with you

## 2020-06-01 NOTE — Telephone Encounter (Signed)
The referral is to [facility] for chronic abdominal bloating and refractory constipation  RV

## 2020-06-03 ENCOUNTER — Other Ambulatory Visit: Payer: Self-pay

## 2020-06-03 ENCOUNTER — Encounter: Payer: Self-pay | Admitting: Dermatology

## 2020-06-03 DIAGNOSIS — L409 Psoriasis, unspecified: Secondary | ICD-10-CM

## 2020-06-03 MED ORDER — PIMECROLIMUS 1 % EX CREA: TOPICAL_CREAM | Freq: Two times a day (BID) | CUTANEOUS | 3 refills | Status: DC

## 2020-06-04 NOTE — Telephone Encounter (Signed)
I have emailed to the concerned email address provided, waiting for reply  RV

## 2020-06-04 NOTE — Telephone Encounter (Signed)
Patient partner Meredith [name] called and left a message on my voicemail about the referral to [place]. He informed me on the voicemail to call his number back because the patient does not answer her cell phone. When I looked at patient DPR patient has never signed a DPR letting us talk to him for our office or all [place]. Left a message for call back on patient cell phone number. Patient partner called back informed partner I could not talk to him until I got permission from the patient that it was okay to talk to him. Patient got on the phone and said it was okay to talk to [place]. Talk to [name] and informed [name] that  [facility] has denied referral but Dr. Marius Ditch has email the [facility] address that [facility] had [facility]. When they called to inform us that the referral was denied they would not provided me with the reason. I informed [name] I would inform him of any information I found out

## 2020-06-05 ENCOUNTER — Encounter: Payer: Self-pay | Admitting: Family Medicine

## 2020-06-08 ENCOUNTER — Telehealth: Payer: Self-pay

## 2020-06-08 NOTE — Telephone Encounter (Signed)
Sabas Sous Patient significant other and her verbalized understanding. He states he talk to her PCP which is Dr. Jens Som now with the [facility] clinic. He states the [facility] clinic  gastroenterology office could specialize in this. He states he is going to get him to refer her to the [facility] clinic.

## 2020-06-08 NOTE — Telephone Encounter (Signed)
-----   Message from Lin Landsman, MD sent at 06/05/2020  4:16 PM EDT ----- Regarding: Re: Referral Caryl Pina, this is the response I received from [facility].  Please let patient know and ask her what she wants.  Please document in her chart  Thank you RV   Dear Dr. Marius Ditch,  I asked our Chief of GI to review your appeal; this was his response:  This is one dx we don't have a lot to offer at present  [facility] has a clinic dedicated to this dx  [facility] also has some expertise  I'd suggest pt go there for 2nd opinion given continued symptoms despite local GI evaluation    Respectfully,  Anastasio Auerbach. Erby Pian, MD, Rochele Raring, FAOA  Professor, Orthopaedic Surgery Chief Medical Officer, [place] Director, [place] and Musculoskeletal CSU

## 2020-06-11 ENCOUNTER — Ambulatory Visit: Payer: Medicaid Other | Admitting: Family Medicine

## 2020-06-21 ENCOUNTER — Encounter: Payer: Self-pay | Admitting: Dermatology

## 2020-07-07 ENCOUNTER — Other Ambulatory Visit: Payer: Self-pay

## 2020-07-07 ENCOUNTER — Ambulatory Visit (INDEPENDENT_AMBULATORY_CARE_PROVIDER_SITE_OTHER): Payer: Medicaid Other | Admitting: Dermatology

## 2020-07-07 DIAGNOSIS — L409 Psoriasis, unspecified: Secondary | ICD-10-CM | POA: Diagnosis not present

## 2020-07-07 DIAGNOSIS — L309 Dermatitis, unspecified: Secondary | ICD-10-CM | POA: Diagnosis not present

## 2020-07-07 DIAGNOSIS — Z79899 Other long term (current) drug therapy: Secondary | ICD-10-CM | POA: Diagnosis not present

## 2020-07-07 MED ORDER — PROTOPIC 0.1 % EX OINT: TOPICAL_OINTMENT | CUTANEOUS | 3 refills | Status: DC

## 2020-07-07 NOTE — Patient Instructions (Addendum)
Recommend daily broad spectrum sunscreen SPF 30+ to sun-exposed areas, reapply every 2 hours as needed. Call for new or changing lesions.  

## 2020-07-07 NOTE — Progress Notes (Signed)
   Follow-Up Visit   Subjective  Meredith [name] is a 54 y.o. female who presents for the following: Follow-up.  Patient presents today for follow up on OV 06/01/20 for Psoriasis, patient is using Elidel, Otezla, Enstilar foam, and Hydroxyzine. Having some side effects from [place]- mild diarrhea.  But she also has IBS.  Ran out of [place] Nail this past week, but diarrhea continued. No other side effects.  Elidel is too expensive.  Trying to get approved for [facility] pt assistance.  Has been using sample packs.  The following portions of the chart were reviewed this encounter and updated as appropriate:      Review of Systems:  No other skin or systemic complaints except as noted in HPI or Assessment and Plan.  Objective  Well appearing patient in no apparent distress; mood and affect are within normal limits.  A focused examination was performed including Face, B/L lower leg, elbows. Relevant physical exam findings are noted in the Assessment and Plan.  Objective  Left Lower Leg - Anterior, Right Lower Leg - Anterior: Large well-demarcated erythematous scaly patches b/l leg  Objective  Nose: Erythema and scale on all finger tips Erythema on nose with mild lichenification upper cutaneous lip   Assessment & Plan  Psoriasis (2) Left Lower Leg - Anterior; Right Lower Leg - Anterior  Improved with decreased itching Continue Otzela 30 mg BID as tolerated can drop to once daily if needed for GI upset, 2 sample packs given to use while she waits for pt assistance approval. Start Tacrolimus 0.1% ointment to affected areas on lower legs QHS Continue Enstilar foam to affected areas on lower legs QAM- pt given trade  Discussed it can take 6 months to see full effect of Otezla  Side effects of Otezla (apremilast) include diarrhea, nausea, headache, upper respiratory infection, depression, and weight decrease (5-10%). It should only be taken by pregnant women after a discussion regarding risks and  benefits with their doctor.   Other Related Medications Apremilast (OTEZLA) 30 MG TABS Apremilast (OTEZLA) 30 MG TABS pimecrolimus (ELIDEL) 1 % cream  Eczema, unspecified type Nose  Start Tacrolimus once to twice daily to affected areas on face, fingers Recommend mild soap and moisturizing cream 1-2 times daily.    PROTOPIC 0.1 % ointment - Nose  Long term medication management.  Return in about 1 month (around 08/06/2020) for Psoriasis.  Marene Lenz, CMA, am acting as scribe for Brendolyn Patty, MD .  Documentation: I have reviewed the above documentation for accuracy and completeness, and I agree with the above.  Brendolyn Patty MD

## 2020-07-14 ENCOUNTER — Ambulatory Visit (INDEPENDENT_AMBULATORY_CARE_PROVIDER_SITE_OTHER)
Admission: RE | Admit: 2020-07-14 | Discharge: 2020-07-14 | Disposition: A | Discharge status: Home / Self Care | Payer: Medicaid Other | Source: Ambulatory Visit | Attending: Family Medicine | Admitting: Family Medicine

## 2020-07-14 ENCOUNTER — Ambulatory Visit: Payer: Medicare Other | Admitting: Family Medicine

## 2020-07-14 ENCOUNTER — Other Ambulatory Visit: Payer: Self-pay

## 2020-07-14 ENCOUNTER — Encounter: Payer: Self-pay | Admitting: Family Medicine

## 2020-07-14 VITALS — BP 110/82 | HR 134 | Ht 60.0 in | Wt 123.0 lb

## 2020-07-14 DIAGNOSIS — M5441 Lumbago with sciatica, right side: Secondary | ICD-10-CM

## 2020-07-14 DIAGNOSIS — G8929 Other chronic pain: Secondary | ICD-10-CM

## 2020-07-14 DIAGNOSIS — M5442 Lumbago with sciatica, left side: Secondary | ICD-10-CM

## 2020-07-14 DIAGNOSIS — M4810 Ankylosing hyperostosis [Forestier], site unspecified: Secondary | ICD-10-CM | POA: Diagnosis not present

## 2020-07-14 DIAGNOSIS — M797 Fibromyalgia: Secondary | ICD-10-CM | POA: Diagnosis not present

## 2020-07-14 DIAGNOSIS — M999 Biomechanical lesion, unspecified: Secondary | ICD-10-CM

## 2020-07-14 MED ORDER — GABAPENTIN 100 MG PO CAPS: 200.0000 mg | ORAL_CAPSULE | Freq: Every day | ORAL | 0 refills | Status: DC

## 2020-07-14 NOTE — Progress Notes (Signed)
[facility] [place] Phone: [phone] Subjective:   [name], am serving as a scribe for Dr. Hulan Saas.  This visit occurred during the SARS-CoV-2 public health emergency.  Safety protocols were in place, including screening questions prior to the visit, additional usage of staff PPE, and extensive cleaning of exam room while observing appropriate contact time as indicated for disinfecting solutions.  I'm seeing this patient by the request  of:  Lattie Haw, MD  CC: Low back pain follow-up  [id number]  Rayneisha Kirsten is a 54 y.o. female coming in with complaint of back and neck pain. OMT 05/27/2020. Patient states that her neck pain on right side is increasing since last visit as well as thoracic spine. Tingling in both hands and legs. Patient feels good in deep squat. Unable to sit or stand for prolgone  Has rheumatology appt in 2 weeks.  Medications patient has been prescribed: Cymbalta  Taking: Yes         Reviewed prior external information including notes and imaging from previsou exam, outside providers and external EMR if available.   As well as notes that were available from care everywhere and other healthcare systems.  Past medical history, social, surgical and family history all reviewed in electronic medical record.  No pertanent information unless stated regarding to the chief complaint.   Past Medical History:  Diagnosis Date  . Anemia   . Anxiety   . Asthma   . Chronic insomnia   . Depression   . Dermatitis   . Gastroparesis   . GERD (gastroesophageal reflux disease)   . Hypertension   . Hypothyroidism   . Rhinitis     Allergies  Allergen Reactions  . Latex Rash  . Shellfish Allergy Rash    Not all the time   . Amoxicillin-Pot Clavulanate     unknown     Review of Systems:  No headache, visual changes, nausea, vomiting, diarrhea, constipation, dizziness, abdominal pain,  skin rash, fevers, chills, night sweats, weight loss, swollen lymph nodes,chest pain, shortness of breath, mood changes. POSITIVE muscle aches, body aches, joint swelling  Objective  Blood pressure 110/82, pulse (!) 134, height 5' (1.524 m), weight 123 lb (55.8 kg), SpO2 98 %.   General: No apparent distress alert and oriented x3 mood and affect normal, dressed appropriately.  HEENT: Pupils equal, extraocular movements intact  Respiratory: Patient's speak in full sentences and does not appear short of breath  Cardiovascular: No lower extremity edema, non tender, no erythema  Neuro: Cranial nerves II through XII are intact, neurovascularly intact in all extremities with 2+ DTRs and 2+ pulses.  Gait normal with good balance and coordination.  MSK: Diffuse tenderness in multiple muscles patient does have some mild weakness of the right foot noted. Back -patient actually has increasing lordosis with good exam previously.  Patient does have some tenderness in this area.  Mild spinous process as well as parascapular pain.   Osteopathic findings  C6 flexed rotated and side bent left T3 extended rotated and side bent right inhaled rib T9 extended rotated and side bent left L2 flexed rotated and side bent right L4 flexed rotated and side bent left Sacrum right on right       Assessment and Plan:  Chronic back pain Continues to have recurrent pain with the addition.  I do like to get patient's x-rays of her lower back and with flexion-extension to see if there is  any significant instability.  I am concerned as well with having the abnormal LFTs and increasing abdominal pain.  Patient does have a follow-up with a gastroenterologist.  Patient is also having increasing in her blood sugars and I do feel that further imaging may be warranted for more of the pancreas and they will discuss that with her new gastroenterologist and primary care provider.  Patient will continue to see me though for the  back pain.  If worsening symptoms will consider advanced imaging gabapentin added today secondary to the exacerbation.  Warned of potential side effects.  Follow-up again in 4  weeks.  Fibromyalgia Likely contributing as well.  We will continue to monitor.  Has responded a little bit to osteophyte manipulation for some of the back pain.  Follow-up with me again in 4 weeks seeing rheumatology in the near future.    Nonallopathic problems  Decision today to treat with OMT was based on Physical Exam  After verbal consent patient was treated with  ME, FPR techniques in cervical, rib, thoracic, lumbar, and sacral  areas  Patient tolerated the procedure well with improvement in symptoms  Patient given exercises, stretches and lifestyle modifications  See medications in patient instructions if given  Patient will follow up in 4-8 weeks      The above documentation has been reviewed and is accurate and complete Lyndal Pulley, DO       Note: This dictation was prepared with Dragon dictation along with smaller phrase technology. Any transcriptional errors that result from this process are unintentional.

## 2020-07-14 NOTE — Assessment & Plan Note (Signed)
Likely contributing as well.  We will continue to monitor.  Has responded a little bit to osteophyte manipulation for some of the back pain.  Follow-up with me again in 4 weeks seeing rheumatology in the near future.

## 2020-07-14 NOTE — Patient Instructions (Signed)
Gabapentin 200mg  at night IF tired take 100mg  instead [place]am Xray for lower back See me in 4-5 weeks

## 2020-07-14 NOTE — Assessment & Plan Note (Signed)
Continues to have recurrent pain with the addition.  I do like to get patient's x-rays of her lower back and with flexion-extension to see if there is any significant instability.  I am concerned as well with having the abnormal LFTs and increasing abdominal pain.  Patient does have a follow-up with a gastroenterologist.  Patient is also having increasing in her blood sugars and I do feel that further imaging may be warranted for more of the pancreas and they will discuss that with her new gastroenterologist and primary care provider.  Patient will continue to see me though for the back pain.  If worsening symptoms will consider advanced imaging gabapentin added today secondary to the exacerbation.  Warned of potential side effects.  Follow-up again in 4  weeks.

## 2020-07-16 ENCOUNTER — Encounter: Payer: Self-pay | Admitting: Dermatology

## 2020-08-04 ENCOUNTER — Other Ambulatory Visit: Payer: Self-pay | Admitting: Obstetrics and Gynecology

## 2020-08-04 DIAGNOSIS — Z1231 Encounter for screening mammogram for malignant neoplasm of breast: Secondary | ICD-10-CM

## 2020-08-11 ENCOUNTER — Ambulatory Visit (INDEPENDENT_AMBULATORY_CARE_PROVIDER_SITE_OTHER): Payer: Medicaid Other | Admitting: Dermatology

## 2020-08-11 ENCOUNTER — Other Ambulatory Visit: Payer: Self-pay

## 2020-08-11 ENCOUNTER — Encounter: Payer: Self-pay | Admitting: Dermatology

## 2020-08-11 DIAGNOSIS — B009 Herpesviral infection, unspecified: Secondary | ICD-10-CM | POA: Diagnosis not present

## 2020-08-11 DIAGNOSIS — L309 Dermatitis, unspecified: Secondary | ICD-10-CM | POA: Diagnosis not present

## 2020-08-11 DIAGNOSIS — L409 Psoriasis, unspecified: Secondary | ICD-10-CM | POA: Diagnosis not present

## 2020-08-11 MED ORDER — MOMETASONE FUROATE 0.1 % EX CREA: TOPICAL_CREAM | CUTANEOUS | 3 refills | Status: DC

## 2020-08-11 NOTE — Progress Notes (Signed)
   Follow-Up Visit   Subjective  Meredith [name] is a 54 y.o. female who presents for the following: Follow-up (psoriasis).  Patient presents today for follow up on OV 07/07/20 for Psoriasis, patient is using Protopic, Otezla, Enstilar foam, and Hydroxyzine. Having some side effects from [place]- mild diarrhea.  But she also has IBS. She takes one tablet daily and can tolerate it.  She is still waiting to get her medication approved, has been using samples, but is almost out.  Overall her skin has improved with less itching on her legs.  She has had some new itchy rashes on her abd and neck.  She had a blistered rash on her buttock.  Now healed.  The following portions of the chart were reviewed this encounter and updated as appropriate:      Review of Systems:  No other skin or systemic complaints except as noted in HPI or Assessment and Plan.  Objective  Well appearing patient in no apparent distress; mood and affect are within normal limits.  A focused examination was performed including nose, b/l lower leg. Relevant physical exam findings are noted in the Assessment and Plan.  Objective  B/L lower leg: large light pink well demarcated patch with decreased erythrema on B/L lower legs   Objective  Left Medial Buttocks: Violaceous patch.  Pt had picture of clustered vesicles on erythematous base. Area was tender.  Objective  Anterior Mid Neck, Right Abdomen (side) - Upper, Right Upper Cutaneous Lip: Light pink patches inframammary, neck, and upper lip with mild lichenification   Assessment & Plan  Psoriasis B/L lower leg  Improving Continue Otezla 30 mg PO qd- 1 more sample pack given. Continue Enstilar foam qhs to legs  Side effects of Otezla (apremilast) include diarrhea, nausea, headache, upper respiratory infection, depression, and weight decrease (5-10%). It should only be taken by pregnant women after a discussion regarding risks and benefits with their doctor. Goal is  control of skin condition, not cure.  The use of [place] Nail requires long term medication management, including periodic office visits.   Other Related Medications Apremilast (OTEZLA) 30 MG TABS Apremilast (OTEZLA) 30 MG TABS pimecrolimus (ELIDEL) 1 % cream  HSV infection Left Medial Buttocks  Currently resolved Will call clinic if recurs. Will plan to culture. Discussed contagious condition when blisters present  Dermatitis (3) Right Abdomen (side) - Upper; Right Upper Cutaneous Lip; Anterior Mid Neck  Continue Protopic 0.1% ointment to aas qd/bid Start Mometasone cream apply to affected areas on abdomen, neck and upper lip and nose bid prn flares until itchy rash cleared  Ordered Medications: mometasone (ELOCON) 0.1 % cream  Return in about 3 months (around 11/11/2020) for Psoriasis.  Marene Lenz, CMA, am acting as scribe for Brendolyn Patty, MD .  Documentation: I have reviewed the above documentation for accuracy and completeness, and I agree with the above.  Brendolyn Patty MD

## 2020-08-11 NOTE — Patient Instructions (Addendum)
Recommend daily broad spectrum sunscreen SPF 30+ to sun-exposed areas, reapply every 2 hours as needed. Call for new or changing lesions.   Side effects of Otezla (apremilast) include diarrhea, nausea, headache, upper respiratory infection, depression, and weight decrease (5-10%). It should only be taken by pregnant women after a discussion regarding risks and benefits with their doctor. Goal is control of skin condition, not cure.  The use of R[place] Nail requires long term medication management, including periodic office visits.

## 2020-08-11 NOTE — Progress Notes (Signed)
[facility] [place] Phone: [phone] Subjective:   Meredith [name], am serving as a scribe for Dr. Hulan Saas.  I'm seeing this patient by the request  of:  Lattie Haw, MD  CC:   [id number]   07/14/2020 Likely contributing as well.  We will continue to monitor.  Has responded a little bit to osteophyte manipulation for some of the back pain.  Follow-up with me again in 4 weeks seeing rheumatology in the near future.  Continues to have recurrent pain with the addition.  I do like to get patient's x-rays of her lower back and with flexion-extension to see if there is any significant instability.  I am concerned as well with having the abnormal LFTs and increasing abdominal pain.  Patient does have a follow-up with a gastroenterologist.  Patient is also having increasing in her blood sugars and I do feel that further imaging may be warranted for more of the pancreas and they will discuss that with her new gastroenterologist and primary care provider.  Patient will continue to see me though for the back pain.  If worsening symptoms will consider advanced imaging gabapentin added today secondary to the exacerbation.  Warned of potential side effects.  Follow-up again in 4  weeks.  Update 08/12/2020 Meredith [name] is a 54 y.o. female coming in with complaint of low back pain. Patient states still has pain that is on and off states it is about the same as last visit. Patient is taking the celebrex twice daily.  Patient has seen rheumatology but they felt like it is only arthritis is where she had.  Patient is now following up with neurology in the near future.  Continuing to have significant back pain.  Patient is going to be following up with a gastroenterologist for the LFTs  Patient's pain still can suffer from activities.  Has not noticed any significant true improvement with the exercises, formal physical therapy, Cymbalta,  Gabapentin    Past Medical History:  Diagnosis Date  . Anemia   . Anxiety   . Asthma   . Chronic insomnia   . Depression   . Dermatitis   . Gastroparesis   . GERD (gastroesophageal reflux disease)   . Hypertension   . Hypothyroidism   . Rhinitis    Past Surgical History:  Procedure Laterality Date  . APPENDECTOMY    . CHOLECYSTECTOMY  2017   Social History   Socioeconomic History  . Marital status: Unknown    Spouse name: Not on file  . Number of children: Not on file  . Years of education: Not on file  . Highest education level: Not on file  Occupational History  . Occupation: unemployed   Tobacco Use  . Smoking status: Never Smoker  . Smokeless tobacco: Never Used  Substance and Sexual Activity  . Alcohol use: No  . Drug use: No  . Sexual activity: Not on file  Other Topics Concern  . Not on file  Social History Narrative  . Not on file   Social Determinants of Health   Financial Resource Strain:   . Difficulty of Paying Living Expenses: Not on file  Food Insecurity:   . Worried About Charity fundraiser in the Last Year: Not on file  . Ran Out of Food in the Last Year: Not on file  Transportation Needs:   . Lack of Transportation (Medical): Not on file  . Lack of Transportation (  Non-Medical): Not on file  Physical Activity:   . Days of Exercise per Week: Not on file  . Minutes of Exercise per Session: Not on file  Stress: Stress Concern Present  . Feeling of Stress : Rather much  Social Connections:   . Frequency of Communication with Friends and Family: Not on file  . Frequency of Social Gatherings with Friends and Family: Not on file  . Attends Religious Services: Not on file  . Active Member of Clubs or Organizations: Not on file  . Attends Archivist Meetings: Not on file  . Marital Status: Not on file   Allergies  Allergen Reactions  . Latex Rash  . Shellfish Allergy Rash    Not all the time   . Amoxicillin-Pot Clavulanate      unknown   History reviewed. No pertinent family history.  Current Outpatient Medications (Endocrine & Metabolic):  .  levothyroxine (SYNTHROID) 125 MCG tablet, Take 1 tablet (125 mcg total) by mouth every morning. 30 minutes before food  Current Outpatient Medications (Cardiovascular):  .  losartan (COZAAR) 25 MG tablet, Take 1 tablet (25 mg total) by mouth at bedtime.  Current Outpatient Medications (Respiratory):  .  albuterol (VENTOLIN HFA) 108 (90 Base) MCG/ACT inhaler, Inhale 1 puff into the lungs every 4 (four) hours. .  budesonide-formoterol (SYMBICORT) 80-4.5 MCG/ACT inhaler, once daily as needed .  cetirizine (ZYRTEC) 10 MG tablet, Take by mouth.  Current Outpatient Medications (Analgesics):  Marland Kitchen  Apremilast (OTEZLA) 30 MG TABS, Take 1 tablet (30 mg total) by mouth 2 (two) times daily. Marland Kitchen  Apremilast (OTEZLA) 30 MG TABS, Take 1 tablet (30 mg total) by mouth 2 (two) times daily. .  celecoxib (CELEBREX) 100 MG capsule, Take by mouth.  Current Outpatient Medications (Hematological):  .  cyanocobalamin (,VITAMIN B-12,) 1000 MCG/ML injection, INJECT 1ML INTO THE MUSCLE EVERY 30 DAYS  Current Outpatient Medications (Other):  .  dexlansoprazole (DEXILANT) 60 MG capsule, Take 60 mg by mouth daily. .  famotidine (PEPCID) 20 MG tablet, Take 20 mg by mouth daily. Marland Kitchen  gabapentin (NEURONTIN) 100 MG capsule, Take 2 capsules (200 mg total) by mouth at bedtime. .  Meclizine HCl 25 MG CHEW, Chew 25 mg by mouth 3 (three) times daily with meals. .  mometasone (ELOCON) 0.1 % cream, apply to affected areas on abdomen, neck and upper lip and nose prn flares .  Omega-3 1000 MG CAPS, Take by mouth. .  pimecrolimus (ELIDEL) 1 % cream, Apply topically 2 (two) times daily. Apply to affected areas of psoriasis on face and body prn flares .  PROTOPIC 0.1 % ointment, Apply to affected areas on Nose, fingertips and to B/L lower legs .  Prucalopride Succinate (MOTEGRITY) 2 MG TABS, Take 1 tablet (2 mg total) by  mouth daily. .  SYRINGE-NEEDLE, DISP, 3 ML (B-D 3CC LUER-LOK SYR 25GX1") 25G X 1" 3 ML MISC, Use 1 syringe with needle monthly with B-1 2injections .  zolpidem (AMBIEN CR) 12.5 MG CR tablet, Take 1 tablet (12.5 mg total) by mouth at bedtime as needed. .  DULoxetine (CYMBALTA) 60 MG capsule, Take 1 capsule (60 mg total) by mouth 2 (two) times daily. Marland Kitchen  zolpidem (AMBIEN CR) 12.5 MG CR tablet, Take 1 tablet (12.5 mg total) by mouth at bedtime as needed.   Reviewed prior external information including notes and imaging from  primary care provider As well as notes that were available from care everywhere and other healthcare systems.  Past medical history,  social, surgical and family history all reviewed in electronic medical record.  No pertanent information unless stated regarding to the chief complaint.   Review of Systems:  No headache, visual changes, nausea, vomiting, diarrhea, constipation, dizziness, , skin rash, fevers, chills, night sweats, weight loss, swollen lymph nodes, , joint swelling, chest pain, shortness of breath, mood changes. POSITIVE muscle aches, body aches, abdominal pain  Objective  Blood pressure 110/70, pulse (!) 120, height 5' (1.524 m), weight 125 lb (56.7 kg), SpO2 99 %.   General: No apparent distress alert and oriented x3 mood and affect normal, dressed appropriately.  HEENT: Pupils equal, extraocular movements intact  Respiratory: Patient's speak in full sentences and does not appear short of breath  Cardiovascular: No lower extremity edema, non tender, no erythema  Gait normal with good balance and coordination.  MSK: Diffuse tenderness noted.  Patient has been in multiple different muscles.  Back does have some loss of lordosis.  Limited range of motion secondary to some voluntary guarding.  Positive straight leg test with radicular symptoms in the L5 distribution mild weakness with dorsiflexion of the foot  Osteopathic findings C7 flexed rotated and side  bent left T3 extended rotated and side bent right inhaled third rib T9 extended rotated and side bent left L2 flexed rotated and side bent right Sacrum right on right    Impression and Recommendations:     The above documentation has been reviewed and is accurate and complete Lyndal Pulley, DO

## 2020-08-12 ENCOUNTER — Ambulatory Visit (INDEPENDENT_AMBULATORY_CARE_PROVIDER_SITE_OTHER): Payer: Medicaid Other | Admitting: Family Medicine

## 2020-08-12 ENCOUNTER — Encounter: Payer: Self-pay | Admitting: Family Medicine

## 2020-08-12 VITALS — BP 110/70 | HR 120 | Ht 60.0 in | Wt 125.0 lb

## 2020-08-12 DIAGNOSIS — M5417 Radiculopathy, lumbosacral region: Secondary | ICD-10-CM

## 2020-08-12 DIAGNOSIS — M999 Biomechanical lesion, unspecified: Secondary | ICD-10-CM | POA: Diagnosis not present

## 2020-08-12 NOTE — Patient Instructions (Addendum)
Good to see you  MRI lumbar spine ordered call [phone] Continue all meds  See me again in 4-6 weeks

## 2020-08-12 NOTE — Assessment & Plan Note (Signed)
   Decision today to treat with OMT was based on Physical Exam  After verbal consent patient was treated with  ME, FPR techniques in cervical, thoracic, rib, lumbar and sacral areas, all areas are chronic   Patient tolerated the procedure well with improvement in symptoms  Patient given exercises, stretches and lifestyle modifications  See medications in patient instructions if given  Patient will follow up in 4-8 weeks 

## 2020-08-12 NOTE — Assessment & Plan Note (Signed)
Continues to have difficulty overall.  Continues to have some radicular symptoms in the L5.  Patient does have degenerative changes of the lumbar spine that seems to be out of proportion to her age.  I do feel that advanced imaging with an MRI would be beneficial at this time.  Patient could be a candidate for potential epidurals.  Patient has been seen by many different providers for many different of her issues and still not making significant strides in improvement.  Continue the Cymbalta as well as the Celebrex for now.  Follow-up after imaging to discuss further treatment options

## 2020-08-13 ENCOUNTER — Telehealth: Payer: Self-pay

## 2020-08-13 NOTE — Telephone Encounter (Signed)
Called patient and advised her to call [place] at [phone] due to needing some additional information regarding finances.  Patient and partner will call.

## 2020-08-28 ENCOUNTER — Other Ambulatory Visit: Payer: Self-pay

## 2020-08-28 DIAGNOSIS — M5417 Radiculopathy, lumbosacral region: Secondary | ICD-10-CM

## 2020-08-28 DIAGNOSIS — M5442 Lumbago with sciatica, left side: Secondary | ICD-10-CM

## 2020-08-28 DIAGNOSIS — G8929 Other chronic pain: Secondary | ICD-10-CM

## 2020-09-04 ENCOUNTER — Other Ambulatory Visit: Payer: Self-pay

## 2020-09-04 ENCOUNTER — Ambulatory Visit
Admission: RE | Admit: 2020-09-04 | Discharge: 2020-09-04 | Disposition: A | Discharge status: Home / Self Care | Payer: Medicaid Other | Source: Ambulatory Visit | Attending: Obstetrics and Gynecology | Admitting: Obstetrics and Gynecology

## 2020-09-04 DIAGNOSIS — Z1231 Encounter for screening mammogram for malignant neoplasm of breast: Secondary | ICD-10-CM | POA: Diagnosis present

## 2020-09-07 ENCOUNTER — Inpatient Hospital Stay
Admission: RE | Admit: 2020-09-07 | Discharge: 2020-09-07 | Disposition: A | Discharge status: Home / Self Care | Payer: Self-pay | Source: Ambulatory Visit | Attending: *Deleted | Admitting: *Deleted

## 2020-09-07 ENCOUNTER — Other Ambulatory Visit: Payer: Self-pay | Admitting: *Deleted

## 2020-09-07 DIAGNOSIS — Z1231 Encounter for screening mammogram for malignant neoplasm of breast: Secondary | ICD-10-CM

## 2020-09-09 ENCOUNTER — Ambulatory Visit (INDEPENDENT_AMBULATORY_CARE_PROVIDER_SITE_OTHER): Payer: Medicaid Other | Admitting: Family Medicine

## 2020-09-09 ENCOUNTER — Encounter: Payer: Self-pay | Admitting: Family Medicine

## 2020-09-09 DIAGNOSIS — M5417 Radiculopathy, lumbosacral region: Secondary | ICD-10-CM

## 2020-09-09 DIAGNOSIS — B029 Zoster without complications: Secondary | ICD-10-CM | POA: Insufficient documentation

## 2020-09-09 MED ORDER — ACYCLOVIR 400 MG PO TABS: 400.0000 mg | ORAL_TABLET | Freq: Three times a day (TID) | ORAL | 0 refills | Status: AC

## 2020-09-09 NOTE — Assessment & Plan Note (Signed)
Continues to have the radicular symptoms.  Does have some leg cramping.  Patient is scheduled for the MRI on September 09, 2020.  Hoping that we will notice significant improvement.  Patient is encouraged to continue the Cymbalta as well as the gabapentin.  Follow-up again in 4 to 8 weeks

## 2020-09-09 NOTE — Assessment & Plan Note (Signed)
Patient does have a new rash noted in the dermatome which seems to be more in the L3-L4 level and only on the right side.  Seems to be on the back.  No tenderness to even light palpation.  Concerned that this could be early shingles.  Acyclovir given 7-day treatment.  Warned that this could upset stomach somewhat.  Discussed that if it is seems to worsen that they need to seek medical attention but I appear patient will do relatively well.

## 2020-09-09 NOTE — Progress Notes (Signed)
[facility] [address] [place] Phone: [phone] Subjective:   I Meredith [name] am serving as a Education administrator for Dr. Hulan Saas.  This visit occurred during the SARS-CoV-2 public health emergency.  Safety protocols were in place, including screening questions prior to the visit, additional usage of staff PPE, and extensive cleaning of exam room while observing appropriate contact time as indicated for disinfecting solutions.   I'm seeing this patient by the request  of:  Adin Hector, MD  CC: Low back pain follow-up  [id number]  Meredith [name] is a 54 y.o. female coming in with complaint of back and neck pain Patient states she is "so so". States her legs are tired and they pop a lot. Hasn't gotten MRI scheduled November 30th.    Medications patient has been prescribed: Cymbalta and gabapentin  Taking: Yes         Reviewed prior external information including notes and imaging from previsou exam, outside providers and external EMR if available.   As well as notes that were available from care everywhere and other healthcare systems.  Past medical history, social, surgical and family history all reviewed in electronic medical record.  No pertanent information unless stated regarding to the chief complaint.   Past Medical History:  Diagnosis Date  . Anemia   . Anxiety   . Asthma   . Chronic insomnia   . Depression   . Dermatitis   . Gastroparesis   . GERD (gastroesophageal reflux disease)   . Hypertension   . Hypothyroidism   . Rhinitis     Allergies  Allergen Reactions  . Latex Rash  . Shellfish Allergy Rash    Not all the time   . Amoxicillin-Pot Clavulanate     unknown     Review of Systems:  No visual changes, nausea, vomiting, diarrhea, constipation, dizziness, skin rash, fevers, chills, night sweats, weight loss, swollen lymph nodes, body aches, joint swelling, chest pain, shortness of breath, mood  changes. POSITIVE muscle aches, body aches, abdominal pain, headache  Objective  Blood pressure 130/84, pulse (!) 111, height 5' (1.524 m), weight 121 lb (54.9 kg), SpO2 98 %.   General: No apparent distress alert and oriented x3 mood and affect normal, dressed appropriately.  HEENT: Pupils equal, extraocular movements intact  Respiratory: Patient's speak in full sentences and does not appear short of breath  Cardiovascular: No lower extremity edema, non tender, no erythema  Gait normal with good balance and coordination.  MSK: Diffuse tenderness in multiple different joints Back -does have some mild loss of lordosis.  Patient is diffusely tender.  Patient does have a rash on that seems to be in the dermatome distribution.  Severely tender to even light sensation.  Questionable vesicles but nothing open at the time.  Osteopathic findings  C7 flexed rotated and side bent left T3 extended rotated and side bent right inhaled rib T8 extended rotated and side bent left L2 flexed rotated and side bent right Sacrum right on right       Assessment and Plan:  Lumbosacral radiculopathy at L5 Continues to have the radicular symptoms.  Does have some leg cramping.  Patient is scheduled for the MRI on September 09, 2020.  Hoping that we will notice significant improvement.  Patient is encouraged to continue the Cymbalta as well as the gabapentin.  Follow-up again in 4 to 8 weeks  Shingles Patient does have a new rash noted in the dermatome  which seems to be more in the L3-L4 level and only on the right side.  Seems to be on the back.  No tenderness to even light palpation.  Concerned that this could be early shingles.  Acyclovir given 7-day treatment.  Warned that this could upset stomach somewhat.  Discussed that if it is seems to worsen that they need to seek medical attention but I appear patient will do relatively well.    Nonallopathic problems  Decision today to treat with OMT was based  on Physical Exam  After verbal consent patient was treated with HVLA, ME, FPR techniques in cervical, rib, thoracic, lumbar, and sacral  areas  Patient tolerated the procedure well with improvement in symptoms  Patient given exercises, stretches and lifestyle modifications  See medications in patient instructions if given  Patient will follow up in 4-8 weeks      The above documentation has been reviewed and is accurate and complete Lyndal Pulley, DO       Note: This dictation was prepared with Dragon dictation along with smaller phrase technology. Any transcriptional errors that result from this process are unintentional.

## 2020-09-09 NOTE — Patient Instructions (Addendum)
Acyclovere 3 times a day for 1 week Heel lifts in shoes or sandals MRI I will write you when we get results See me again in 6-8 weeks

## 2020-09-11 ENCOUNTER — Other Ambulatory Visit: Payer: Self-pay | Admitting: Neurology

## 2020-09-11 DIAGNOSIS — R519 Headache, unspecified: Secondary | ICD-10-CM

## 2020-09-22 ENCOUNTER — Telehealth: Payer: Self-pay

## 2020-09-22 ENCOUNTER — Ambulatory Visit: Admission: RE | Admit: 2020-09-22 | Payer: Medicaid Other | Source: Ambulatory Visit

## 2020-09-22 NOTE — Telephone Encounter (Signed)
Received a call from [facility] insurance that they are needing a peer to peer for patient MRI. Seems like I can just call and hand the phone over what time or day works best.

## 2020-09-23 ENCOUNTER — Ambulatory Visit: Payer: Medicaid Other

## 2020-10-02 ENCOUNTER — Other Ambulatory Visit: Payer: Medicaid Other

## 2020-10-02 ENCOUNTER — Other Ambulatory Visit: Payer: Self-pay

## 2020-10-02 ENCOUNTER — Ambulatory Visit
Admission: RE | Admit: 2020-10-02 | Discharge: 2020-10-02 | Disposition: A | Discharge status: Home / Self Care | Payer: Medicaid Other | Source: Ambulatory Visit | Attending: Family Medicine | Admitting: Family Medicine

## 2020-10-02 ENCOUNTER — Ambulatory Visit
Admission: RE | Admit: 2020-10-02 | Discharge: 2020-10-02 | Disposition: A | Discharge status: Home / Self Care | Payer: Medicaid Other | Source: Ambulatory Visit | Attending: Neurology | Admitting: Neurology

## 2020-10-02 DIAGNOSIS — R519 Headache, unspecified: Secondary | ICD-10-CM | POA: Insufficient documentation

## 2020-10-02 DIAGNOSIS — G8929 Other chronic pain: Secondary | ICD-10-CM | POA: Diagnosis present

## 2020-10-02 DIAGNOSIS — M5442 Lumbago with sciatica, left side: Secondary | ICD-10-CM | POA: Insufficient documentation

## 2020-10-02 DIAGNOSIS — M5441 Lumbago with sciatica, right side: Secondary | ICD-10-CM | POA: Diagnosis present

## 2020-10-02 DIAGNOSIS — M5417 Radiculopathy, lumbosacral region: Secondary | ICD-10-CM | POA: Insufficient documentation

## 2020-10-05 ENCOUNTER — Encounter: Payer: Self-pay | Admitting: Family Medicine

## 2020-10-12 ENCOUNTER — Other Ambulatory Visit: Payer: Self-pay

## 2020-10-12 DIAGNOSIS — M5416 Radiculopathy, lumbar region: Secondary | ICD-10-CM

## 2020-10-13 NOTE — Telephone Encounter (Signed)
Patient called stating that her insurance has denied the epidural. She asked if we would be able to get this approved or if there are any other options?

## 2020-10-19 ENCOUNTER — Encounter: Payer: Self-pay | Admitting: Dermatology

## 2020-10-20 NOTE — Telephone Encounter (Signed)
Victorino Dike at [facility] called stating that it shows that they are needing a pain score, the activities are being hindered by this issues, and what active treatment she is doing along with the duration.  Please advise. Victorino Dike can be reached at [phone]

## 2020-10-28 ENCOUNTER — Other Ambulatory Visit: Payer: Self-pay

## 2020-10-28 ENCOUNTER — Encounter: Payer: Self-pay | Admitting: Family Medicine

## 2020-10-28 ENCOUNTER — Ambulatory Visit: Payer: Medicaid Other | Admitting: Family Medicine

## 2020-10-28 VITALS — BP 124/82 | HR 126 | Ht 60.0 in | Wt 126.0 lb

## 2020-10-28 DIAGNOSIS — M9908 Segmental and somatic dysfunction of rib cage: Secondary | ICD-10-CM | POA: Diagnosis not present

## 2020-10-28 DIAGNOSIS — M797 Fibromyalgia: Secondary | ICD-10-CM | POA: Diagnosis not present

## 2020-10-28 DIAGNOSIS — G5600 Carpal tunnel syndrome, unspecified upper limb: Secondary | ICD-10-CM | POA: Insufficient documentation

## 2020-10-28 DIAGNOSIS — M9901 Segmental and somatic dysfunction of cervical region: Secondary | ICD-10-CM

## 2020-10-28 DIAGNOSIS — M999 Biomechanical lesion, unspecified: Secondary | ICD-10-CM | POA: Diagnosis not present

## 2020-10-28 DIAGNOSIS — M5417 Radiculopathy, lumbosacral region: Secondary | ICD-10-CM | POA: Diagnosis not present

## 2020-10-28 DIAGNOSIS — M9904 Segmental and somatic dysfunction of sacral region: Secondary | ICD-10-CM | POA: Diagnosis not present

## 2020-10-28 DIAGNOSIS — M9903 Segmental and somatic dysfunction of lumbar region: Secondary | ICD-10-CM

## 2020-10-28 DIAGNOSIS — G5603 Carpal tunnel syndrome, bilateral upper limbs: Secondary | ICD-10-CM

## 2020-10-28 NOTE — Assessment & Plan Note (Signed)
Patient had an EMG and will be having an injection by another provider.

## 2020-10-28 NOTE — Progress Notes (Signed)
[name] [address] [place] [address] Phone: [phone] Subjective:   Meredith [name], am serving as a scribe for Dr. Antoine Primas. This visit occurred during the SARS-CoV-2 public health emergency.  Safety protocols were in place, including screening questions prior to the visit, additional usage of staff PPE, and extensive cleaning of exam room while observing appropriate contact time as indicated for disinfecting solutions.   I'm seeing this patient by the request  of:  [name], MD  CC: Low back pain, neck pain and allover pain follow-up  [id number]  Meredith [name] is a 55 y.o. female coming in with complaint of back and neck pain. OMT 09/09/2020. Patient states constant pain that increases with standing for a long period of time. Patient is unable to walk for long distances due to pain. Pain radiates down both legs. Pain is a constant 7-8 out of 10 but can become 10/10.  Continuing the Cymbalta as well as the gabapentin.  Saw neurology yesterday and is going to get carpal tunnel injection in right wrist. Carpal tunnel developed when she was working.         Reviewed prior external information including notes and imaging from previsou exam, outside providers and external EMR if available.   As well as notes that were available from care everywhere and other healthcare systems.  Past medical history, social, surgical and family history all reviewed in electronic medical record.  No pertanent information unless stated regarding to the chief complaint.   Past Medical History:  Diagnosis Date  . Anemia   . Anxiety   . Asthma   . Chronic insomnia   . Depression   . Dermatitis   . Gastroparesis   . GERD (gastroesophageal reflux disease)   . Hypertension   . Hypothyroidism   . Rhinitis     Allergies  Allergen Reactions  . Latex Rash  . Shellfish Allergy Rash    Not all the time   . Amoxicillin-Pot Clavulanate      unknown     Review of Systems:  No headache, visual changes, nausea, vomiting, diarrhea, constipation, dizziness, abdominal pain, skin rash, fevers, chills, night sweats, weight loss, swollen lymph nodes, body aches, joint swelling, chest pain, shortness of breath, mood changes. POSITIVE muscle aches  Objective  Blood pressure 124/82, pulse (!) 126, height 5' (1.524 m), weight 126 lb (57.2 kg), SpO2 97 %.   General: No apparent distress alert and oriented x3 mood and affect normal, dressed appropriately.  HEENT: Pupils equal, extraocular movements intact  Respiratory: Patient's speak in full sentences and does not appear short of breath  Cardiovascular: No lower extremity edema, non tender, no erythema  Back -patient's low back does have some loss of lordosis.  Positive straight leg test on the right side.  Does have weakness with dorsi flexion of the foot.  Patient does have mild radicular symptoms with the straight leg test in the L5 distribution.  This is at 20 degrees of forward flexion.  Neurovascularly intact distally.  Neck exam significant tightness of the musculature.  Patient does have some mild increase in involuntary guarding noted.  Negative Spurling's today.  Decent grip strength that is symmetric.  Patient does have mild positive Tinel's at the wrist.  Osteopathic findings  C6 flexed rotated and side bent left T3 extended rotated and side bent right inhaled rib T8 extended rotated and side bent left L4 flexed rotated and side bent right Sacrum right  on right       Assessment and Plan:  Lumbosacral radiculopathy at L5 Patient continues to have radicular symptoms.  Does have leg cramping.  Patient severity of pain is 8 out of 10.  Patient does have this affecting daily activities even walking greater than 200 feet with increasing discomfort and pain.  Patient is to continue the Cymbalta as well as the gabapentin but I do feel injections will be beneficial in this  individual.  We will attempt a L4-L5 epidural injection and that will be scheduled at a later date follow-up with me in 3 to 4 weeks after the injection  Carpal tunnel syndrome Patient had an EMG and will be having an injection by another provider.    Nonallopathic problems  Decision today to treat with OMT was based on Physical Exam  After verbal consent patient was treated with HVLA, ME, FPR techniques in cervical, rib, thoracic, lumbar, and sacral  areas  Patient tolerated the procedure well with improvement in symptoms  Patient given exercises, stretches and lifestyle modifications  See medications in patient instructions if given  Patient will follow up in 4-8 weeks      The above documentation has been reviewed and is accurate and complete Lyndal Pulley, DO       Note: This dictation was prepared with Dragon dictation along with smaller phrase technology. Any transcriptional errors that result from this process are unintentional.

## 2020-10-28 NOTE — Patient Instructions (Signed)
See me in 6 weeks  We will get epidural approved

## 2020-10-28 NOTE — Assessment & Plan Note (Signed)
Patient continues to have radicular symptoms.  Does have leg cramping.  Patient severity of pain is 8 out of 10.  Patient does have this affecting daily activities even walking greater than 200 feet with increasing discomfort and pain.  Patient is to continue the Cymbalta as well as the gabapentin but I do feel injections will be beneficial in this individual.  We will attempt a L4-L5 epidural injection and that will be scheduled at a later date follow-up with me in 3 to 4 weeks after the injection

## 2020-11-10 ENCOUNTER — Ambulatory Visit: Payer: Medicaid Other | Admitting: Dermatology

## 2020-11-15 ENCOUNTER — Encounter: Payer: Self-pay | Admitting: Family Medicine

## 2020-11-15 MED ORDER — GABAPENTIN 100 MG PO CAPS
200.0000 mg | ORAL_CAPSULE | Freq: Every day | ORAL | 0 refills | Status: DC
Start: 2020-11-15 — End: 2021-04-22

## 2020-11-19 ENCOUNTER — Telehealth: Payer: Self-pay

## 2020-11-19 NOTE — Telephone Encounter (Signed)
I just got another fax denying the epidural. I really do not know what else I can try.

## 2020-11-19 NOTE — Telephone Encounter (Signed)
Sent patient MyChart message.

## 2020-11-19 NOTE — Telephone Encounter (Signed)
[employer] called about patient and asked a few questions. Patient location was changed to  as that is in PAR and needed the CPT code. [employer] is sending off to [facility] and states everything should be okay now and should be able to go through now that things were added and changed to the appeal. Now just waiting for a response from [employer]. Once that comes back I will inform patient of number to call and schedule and the reason for change in location

## 2020-11-19 NOTE — Telephone Encounter (Signed)
I guess lets send a message to patient and tell her. It is disappointing

## 2020-11-23 ENCOUNTER — Other Ambulatory Visit: Payer: Self-pay

## 2020-11-23 ENCOUNTER — Encounter: Payer: Self-pay | Admitting: Dietician

## 2020-11-23 ENCOUNTER — Encounter: Payer: Medicaid Other | Attending: Internal Medicine | Admitting: Dietician

## 2020-11-23 VITALS — Ht 60.0 in | Wt 128.3 lb

## 2020-11-23 DIAGNOSIS — K3184 Gastroparesis: Secondary | ICD-10-CM | POA: Insufficient documentation

## 2020-11-23 NOTE — Progress Notes (Signed)
Medical Nutrition Therapy: Visit start time: 1400  end time: 1520  Assessment:  Diagnosis: gastroparesis Past medical history: HTN, GERD, Fibromyalgia, hypothyroidism, IBS Psychosocial issues/ stress concerns: history of anxiety, trouble sleeping per patient  Preferred learning method:  . No preference indicated  Current weight: 128.5lbs with shoes  Height: 5'0" Medications, supplements: reconciled list in medical record  Progress and evaluation:   Patient reports long history of GI issues/ symptoms; she states that symptoms recurred / became worse recently, when she was having nausea and vomiting after eating, sometimes vomiting even water.   She was having some constipation, and began taking Miralax, but is now having 6-8 bowel movements daily.   She has been making diet changes-- chewing food more and eating small portions.   Vomiting has now improved. She does report feeling bloating constantly, and will have bowel movements soon after eating.    Patient's weight seems to be stable, fluctuating between 120-128lbs.  She states her appetite is often poor, due to frequent GI distress.  Physical activity: none at this time (spinal stenosis, weakness from GI symptoms)  Dietary Intake:  Usual eating pattern includes 2 meals and 0-1 snacks per day. Dining out frequency: 1-2 meals per week.  Breakfast:  (late am) portion of a bagel sometimes with peanut butter or cream cheese, coffee with flavored cream no sugar/ sweetener Snack: usually none Lunch: skips; does drink Premier Protein drink most days Snack: occasional crackers Supper: occasionally craves spaghetti and eats only a small portion; sometimes cereal; traditional Filipino food; 2 small pieces pizza (triggers dumping) Snack: none Beverages: water, ginger ale, coffee in am  Nutrition Care Education: Topics covered:   Gastroparesis: definition of gastroparesis; importance of small, frequent meals/ snacks; importance of low  fat, low fiber, low acidity food choices; soft foods; specific food and meal options including liquids and pureed options; importance of adherence to recommended foods to promote GI healing GERD/ GI symptoms: choosing low-acid, avoiding spicy foods; small frequent meals; eating meals when relaxed and not anxious or stressed  Nutritional Diagnosis:  Dobbins-1.4 Altered GI function As related to gastroparesis.  As evidenced by GI distress after eating and early satiety. NI-5.11.1 Predicted suboptimal nutrient intake As related to skipped meals, poor appetite.  As evidenced by patient report of dietary intake.  Intervention:  . Instruction and discussion as noted above. . Advised strict adherence to low fat and low fiber diet with small meals and snacks to promote and needed GI healing, to promote chance for some improved tolerance. . Established goals with input from patient.  Education Materials given:  Marland Kitchen Gastroparesis Nutrition Therapy (AND) . Visit summary with goals/ instructions   Learner/ who was taught:  . Patient   Level of understanding: Marland Kitchen Verbalizes/ demonstrates competency   Demonstrated degree of understanding via:   Teach back Learning barriers: . None  Willingness to learn/ readiness for change: . Eager, change in progress   Monitoring and Evaluation:  Dietary intake, exercise, GI symptoms, and body weight      follow up: 12/21/20 at 3:00pm

## 2020-11-23 NOTE — Patient Instructions (Signed)
   Work on eating a small amount of food every 4 hours during the day even if it is just 1-2 plain crackers or a yogurt drink.   Avoid all "not recommended" foods on provided list and choose low fiber, low fat, low acid foods only. Keep this going for at least 2-3 months so any needed healing can take place in your system. Pushing limits can make symptoms worse over time.   Talk with the GI doctor about whether you need to reduce Miralax while having frequent bowel movements. Also ask about using Beano, if it might help with bloating.   Choose low fat/ "light" cream cheese, you can also try Laughing Cow Light cheese.   If you have difficulty avoiding coffee, make sure it is decaf.   OK to have a breakfast drink like Medical illustrator for a meal or snack.Marland Kitchen

## 2020-12-02 ENCOUNTER — Other Ambulatory Visit: Payer: Self-pay

## 2020-12-02 ENCOUNTER — Ambulatory Visit: Payer: Medicaid Other | Admitting: Dermatology

## 2020-12-02 DIAGNOSIS — L719 Rosacea, unspecified: Secondary | ICD-10-CM

## 2020-12-02 DIAGNOSIS — L309 Dermatitis, unspecified: Secondary | ICD-10-CM

## 2020-12-02 DIAGNOSIS — L409 Psoriasis, unspecified: Secondary | ICD-10-CM

## 2020-12-02 MED ORDER — METROCREAM 0.75 % EX CREA: TOPICAL_CREAM | CUTANEOUS | 5 refills | Status: DC

## 2020-12-02 MED ORDER — METRONIDAZOLE 0.75 % EX CREA: TOPICAL_CREAM | CUTANEOUS | 5 refills | Status: DC

## 2020-12-02 MED ORDER — EUCRISA 2 % EX OINT: TOPICAL_OINTMENT | CUTANEOUS | 3 refills | Status: DC

## 2020-12-02 NOTE — Progress Notes (Addendum)
   Follow-Up Visit   Subjective  Meredith [name] is a 55 y.o. female who presents for the following: Follow-up.  Patient is here for psoriasis check. She is on and tolerating Otezla pills twice daily and she alternates topicals between Elidel cream and Protopic ointment. She is mostly better, but her right lower leg is itchy.  She gets dermatitis on the face and is using mometasone cream prn. Under her eyes is dry and flaky.  The following portions of the chart were reviewed this encounter and updated as appropriate:       Review of Systems:  No other skin or systemic complaints except as noted in HPI or Assessment and Plan.  Objective  Well appearing patient in no apparent distress; mood and affect are within normal limits.  A focused examination was performed including face, legs. Relevant physical exam findings are noted in the Assessment and Plan.  Objective  lower legs: Large light pink patch on bil lower legs, minimal scale  Objective  face: Decreased erythema on upper lip, mild lichenification, forehead clear  Objective  face: Mild erythema on cheeks, nose with telangiectasias   Assessment & Plan  Psoriasis lower legs  Chronic condition- improving on Otezla, not at goal  Continue Otezla 30 mg bid Continue Elidel cream or Protopic ointment qd/bid prn  Psoriasis is a chronic non-curable, but treatable genetic/hereditary disease that may have other systemic features affecting other organ systems such as joints (Psoriatic Arthritis). It is associated with an increased risk of inflammatory bowel disease, heart disease, non-alcoholic fatty liver disease, and depression.     Other Related Medications Apremilast (OTEZLA) 30 MG TABS pimecrolimus (ELIDEL) 1 % cream  Dermatitis face  Vrs Psoriasis- improving Eucrisa sample x 1, apply to upper lip/aas face BID until improved dsp 60g. Mometasone cream qd/bid prn flares  Recommend mild soap and moisturizing cream 1-2  times daily.      Crisaborole (EUCRISA) 2 % OINT - face  Rosacea face  With flare Start metronidazole 0.75% cream Apply to face qd/bid for rosacea.  Rosacea is a chronic progressive skin condition usually affecting the face of adults, causing redness and/or acne bumps. It is treatable but not curable. It sometimes affects the eyes (ocular rosacea) as well. It may respond to topical and/or systemic medication and can flare with stress, sun exposure, alcohol, exercise and some foods.  Daily application of broad spectrum spf 30+ sunscreen to face is recommended to reduce flares.   Other Related Medications METROCREAM 0.75 % cream  Return in about 5 months (around 05/01/2021) for Psoriasis, Dermatitis.  IJamesetta Orleans, CMA, am acting as scribe for Brendolyn Patty, MD .  Documentation: I have reviewed the above documentation for accuracy and completeness, and I agree with the above.  Brendolyn Patty MD

## 2020-12-02 NOTE — Progress Notes (Signed)
Rx needed stamped DAW for insurance °

## 2020-12-02 NOTE — Patient Instructions (Addendum)
Eucrisa ointment - Apply to upper lip twice a day until improved. May also use on other rashes on face, neck, body.    Rosacea is a chronic progressive skin condition usually affecting the face of adults, causing redness and/or acne bumps. It is treatable but not curable. It sometimes affects the eyes (ocular rosacea) as well. It may respond to topical and/or systemic medication and can flare with stress, sun exposure, alcohol, exercise and some foods.  Daily application of broad spectrum spf 30+ sunscreen to face is recommended to reduce flares. Start metronidazole cream 1-2 times a day to cheeks, nose for rosacea.

## 2020-12-03 ENCOUNTER — Other Ambulatory Visit: Payer: Self-pay

## 2020-12-03 DIAGNOSIS — L719 Rosacea, unspecified: Secondary | ICD-10-CM

## 2020-12-03 MED ORDER — METROCREAM 0.75 % EX CREA: TOPICAL_CREAM | CUTANEOUS | 5 refills | Status: DC

## 2020-12-09 NOTE — Progress Notes (Signed)
Meredith [name] Phone: [phone] Subjective:   Meredith [name], am serving as a scribe for Dr. Hulan Saas. This visit occurred during the SARS-CoV-2 public health emergency.  Safety protocols were in place, including screening questions prior to the visit, additional usage of staff PPE, and extensive cleaning of exam room while observing appropriate contact time as indicated for disinfecting solutions.   I'm seeing this patient by the request  of:  Adin Hector, MD  CC: Back and neck pain follow-up  [id number]  Meredith [name] is a 55 y.o. female coming in with complaint of back and neck pain. OMT 10/28/2020. Patient states that she could barely walk yesterday. Pain is 8/10. Pain shoots into the hips and thighs L worse then R.  Patient states that the pain is severe.  Patient had even difficulty walking anywhere in the house yesterday.  Denies any fevers or chills.  Just has a lot of other chronic ailments at the moment.  Medications patient has been prescribed: Duloxetine  Taking: Yes         Reviewed prior external information including notes and imaging from previsou exam, outside providers and external EMR if available.   As well as notes that were available from care everywhere and other healthcare systems.  Past medical history, social, surgical and family history all reviewed in electronic medical record.  [name] pertanent information unless stated regarding to the chief complaint.   Past Medical History:  Diagnosis Date  . Anemia   . Anxiety   . Asthma   . Chronic insomnia   . Depression   . Dermatitis   . Gastroparesis   . GERD (gastroesophageal reflux disease)   . Hypertension   . Hypothyroidism   . Rhinitis     Allergies  Allergen Reactions  . Latex Rash  . Shellfish Allergy Rash    Not all the time   . Amoxicillin-Pot Clavulanate     unknown     Review of Systems:  [name] headache,  visual changes, nausea, vomiting, diarrhea, constipation, dizziness, abdominal pain, skin rash, fevers, chills, night sweats, weight loss, swollen lymph nodes,  Positive muscle aches and body aches  Objective  Blood pressure 122/84, pulse (!) 101, height 5' (1.524 m), weight 127 lb (57.6 kg), SpO2 98 %.   General: [name] apparent distress alert and oriented x3 mood and affect normal, dressed appropriately.  HEENT: Pupils equal, extraocular movements intact  Respiratory: Patient's speak in full sentences and does not appear short of breath  Cardiovascular: [name] lower extremity edema, non tender, [name] erythema   Gait normal with good balance and coordination.  MSK: Tender in multiple areas, Back -significant loss of lordosis.  Patient does have tightness noted in the thoracolumbar juncture but has more pain in the L4-L5 and L5-S1 paraspinal musculature.  Difficulty with extension of the back.  Patient has severe discomfort overall.  4-5 strength of the lower extremities but symmetric.  Mild brisk reflexes of the extremities bilaterally  Osteopathic findings  C2 flexed rotated and side bent right C6 flexed rotated and side bent left T3 extended rotated and side bent right inhaled rib T9 extended rotated and side bent left L2 flexed rotated and side bent right Sacrum right on right       Assessment and Plan:  Spinal stenosis, lumbar region, with neurogenic claudication Patient has severe pain with exacerbation.  Attempted osteopathic manipulation.  Patient continues to have significant amount  of pain and MRI is consistent with a severe spinal stenosis.  We attempted to get patient an epidural but was unable to do so secondary to insurance at this time.  We did discuss potential referral to neurosurgery to discuss surgical intervention but patient as well as significant other would like to avoid it.  Patient given a prescription for short-term for tramadol for breakthrough.  Patient is on duloxetine  and warned of the potential for serotonin syndrome.  Patient significant other is a paramedic and understands this medication.  We will see how the patient survives.  Patient is well as significant other would be concerned with stronger medications and I agree.  Patient will follow up with me again in 6 weeks otherwise.    Nonallopathic problems  Decision today to treat with OMT was based on Physical Exam  After verbal consent patient was treated with  ME, FPR techniques in cervical, rib, thoracic, lumbar, and sacral  areas avoided HVLA today secondary to the patient's pain.  Patient tolerated the procedure well with improvement in symptoms  Patient given exercises, stretches and lifestyle modifications  See medications in patient instructions if given  Patient will follow up in 6 weeks      The above documentation has been reviewed and is accurate and complete Lyndal Pulley, DO       Note: This dictation was prepared with Dragon dictation along with smaller phrase technology. Any transcriptional errors that result from this process are unintentional.

## 2020-12-10 ENCOUNTER — Other Ambulatory Visit: Payer: Self-pay

## 2020-12-10 ENCOUNTER — Ambulatory Visit: Payer: Medicaid Other | Admitting: Family Medicine

## 2020-12-10 ENCOUNTER — Ambulatory Visit (INDEPENDENT_AMBULATORY_CARE_PROVIDER_SITE_OTHER): Payer: Medicaid Other | Admitting: Family Medicine

## 2020-12-10 ENCOUNTER — Encounter: Payer: Self-pay | Admitting: Family Medicine

## 2020-12-10 VITALS — BP 122/84 | HR 101 | Ht 60.0 in | Wt 127.0 lb

## 2020-12-10 DIAGNOSIS — M48062 Spinal stenosis, lumbar region with neurogenic claudication: Secondary | ICD-10-CM

## 2020-12-10 DIAGNOSIS — M999 Biomechanical lesion, unspecified: Secondary | ICD-10-CM | POA: Diagnosis not present

## 2020-12-10 MED ORDER — TRAMADOL HCL 50 MG PO TABS
50.0000 mg | ORAL_TABLET | Freq: Two times a day (BID) | ORAL | 0 refills | Status: DC | PRN
Start: 2020-12-10 — End: 2021-04-22

## 2020-12-10 NOTE — Assessment & Plan Note (Signed)
Patient has severe pain with exacerbation.  Attempted osteopathic manipulation.  Patient continues to have significant amount of pain and MRI is consistent with a severe spinal stenosis.  We attempted to get patient an epidural but was unable to do so secondary to insurance at this time.  We did discuss potential referral to neurosurgery to discuss surgical intervention but patient as well as significant other would like to avoid it.  Patient given a prescription for short-term for tramadol for breakthrough.  Patient is on duloxetine and warned of the potential for serotonin syndrome.  Patient significant other is a paramedic and understands this medication.  We will see how the patient survives.  Patient is well as significant other would be concerned with stronger medications and I agree.  Patient will follow up with me again in 6 weeks otherwise.

## 2020-12-10 NOTE — Patient Instructions (Signed)
Good to see you You do have severe spinal stenosis  Use Tramadol sparing and take with a Tylenol See me again in 6 weeks  Tramadol tablets What is this medicine? TRAMADOL (TRA ma dole) is a pain reliever. It is used to treat severe pain. This medicine may be used for other purposes; ask your health care provider or pharmacist if you have questions. COMMON BRAND NAME(S): Ultram What should I tell my health care provider before I take this medicine? They need to know if you have any of these conditions:  brain tumor  drug abuse or addiction  head injury  if you often drink alcohol  kidney disease  liver disease  lung disease, asthma, or breathing problems  seizures  stomach or intestine problems  suicidal thoughts, plans or attempt; a previous suicide attempt by you or a family member  taken an MAOI like Marplan, Nardil, or Parnate in the last 14 days  an unusual allergic reaction to tramadol, other medicines, foods, dyes, or preservative  pregnant or trying to get pregnant  breast-feeding How should I use this medicine? Take this medicine by mouth with a full glass of water. Follow the directions on the prescription label. You can take it with or without food. If it upsets your stomach, take it with food. Do not take your medicine more often than directed. A special MedGuide will be given to you by the pharmacist with each prescription and refill. Be sure to read this information carefully each time. Talk to your pediatrician regarding the use of this medicine in children. Special care may be needed. Overdosage: If you think you have taken too much of this medicine contact a poison control center or emergency room at once. NOTE: This medicine is only for you. Do not share this medicine with others. What if I miss a dose? If you miss a dose, take it as soon as you can. If it is almost time for your next dose, take only that dose. Do not take double or extra doses. What  may interact with this medicine? Do not take this medicine with any of the following medications:  linezolid  MAOIs like Marplan, Nardil, and Parnate  methylene blue  ozanimod This medicine may also interact with the following medications:  alcohol  antihistamines for allergy, cough, and cold  atropine  certain antibiotics like erythromycin, clarithromycin, rifampin  certain antivirals for HIV or hepatitis  certain medicines for anxiety or sleep  certain medicines for bladder problems like oxybutynin, tolterodine  certain medicines for depression like amitriptyline, bupropion, fluoxetine, paroxetine, sertraline  certain medicines for fungal infections like ketoconazole, itraconazole, or posaconazole  certain medicines for migraine headache like almotriptan, eletriptan, frovatriptan, naratriptan, rizatriptan, sumatriptan, zolmitriptan  certain medicines for Parkinson's disease like benztropine, trihexyphenidyl  certain medicines for seizures like carbamazepine, phenobarbital, primidone  certain medicines for stomach problems like dicyclomine, hyoscyamine  certain medicines for travel sickness like scopolamine  digoxin  diuretics  general anesthetics like halothane, isoflurane, methoxyflurane, propofol  ipratropium  medicines that relax muscles for surgery  other narcotic medicines for pain  phenothiazines like chlorpromazine, mesoridazine, prochlorperazine, thioridazine  quinidine  warfarin This list may not describe all possible interactions. Give your health care provider a list of all the medicines, herbs, non-prescription drugs, or dietary supplements you use. Also tell them if you smoke, drink alcohol, or use illegal drugs. Some items may interact with your medicine. What should I watch for while using this medicine? Tell your health care provider  if your pain does not go away, if it gets worse, or if you have new or a different type of pain. You may  develop tolerance to this drug. Tolerance means that you will need a higher dose of the drug for pain relief. Tolerance is normal and is expected if you take this drug for a long time. Do not suddenly stop taking your drug because you may develop a severe reaction. Your body becomes used to the drug. This does NOT mean you are addicted. Addiction is a behavior related to getting and using a drug for a nonmedical reason. If you have pain, you have a medical reason to take pain drug. Your health care provider will tell you how much drug to take. If your health care provider wants you to stop the drug, the dose will be slowly lowered over time to avoid any side effects. If you take other drugs that also cause drowsiness like other narcotic pain drugs, benzodiazepines, or other drugs for sleep, you may have more side effects. Give your health care provider a list of all drugs you use. He or she will tell you how much drug to take. Do not take more drug than directed. Get emergency help right away if you have trouble breathing or are unusually tired or sleepy. Talk to your health care provider about naloxone and how to get it. Naloxone is an emergency drug used for an opioid overdose. An overdose can happen if you take too much opioid. It can also happen if an opioid is taken with some other drugs or substances, like alcohol. Know the symptoms of an overdose, like trouble breathing, unusually tired or sleepy, or not being able to respond or wake up. Make sure to tell caregivers and close contacts where it is stored. Make sure they know how to use it. After naloxone is given, you must get emergency help right away. Naloxone is a temporary treatment. Repeat doses may be needed. This drug may cause serious skin reactions. They can happen weeks to months after starting the drug. Contact your health care provider right away if you notice fevers or flu-like symptoms with a rash. The rash may be red or purple and then turn  into blisters or peeling of the skin. Or, you might notice a red rash with swelling of the face, lips, or lymph nodes in your neck or under your arms. You may get drowsy or dizzy. Do not drive, use machinery, or do anything that needs mental alertness until you know how this drug affects you. Do not stand up or sit up quickly, especially if you are an older patient. This reduces the risk of dizzy or fainting spells. Alcohol may interfere with the effect of this drug. Avoid alcoholic drinks. This drug will cause constipation. If you do not have a bowel movement for 3 days, call your health care provider. Your mouth may get dry. Chewing sugarless gum or sucking hard candy and drinking plenty of water may help. Contact your health care provider if the problem does not go away or is severe. What side effects may I notice from receiving this medicine? Side effects that you should report to your doctor or health care professional as soon as possible:  allergic reactions (skin rash, itching or hives; swelling of the face, lips, or tongue)  confusion  low adrenal gland function (nausea; vomiting; loss of appetite; unusually weak or tired; dizziness; low blood pressure)  low blood pressure (dizziness; feeling faint or lightheaded,  falls; unusually weak or tired)  low blood sugar (feeling anxious; confusion, dizziness, increased hunger; unusually weak or tired; increased sweating; shakiness; cold, clammy skin; irritable; headache; blurred vision; fast heartbeat; loss of consciousness)  low sodium level (muscle weakness, fatigue, dizziness, headache, confusion)  redness, blistering, peeling or loosening of the skin, including inside the mouth  seizures  serotonin syndrome (irritable; confusion; diarrhea; fast or irregular heartbeat; muscle twitching; stiff muscles; trouble walking; sweating; high fever; seizures; chills; vomiting)  trouble breathing Side effects that usually do not require medical  attention (report to your doctor or health care professional if they continue or are bothersome):  constipation  dry mouth  nausea, vomiting  tiredness This list may not describe all possible side effects. Call your doctor for medical advice about side effects. You may report side effects to FDA at 1-800-FDA-1088. Where should I keep my medicine? Keep out of the reach of children and pets. This medicine can be abused. Keep it in a safe place to protect it from theft. Do not share it with anyone. It is only for you. Selling or giving away this medicine is dangerous and against the law. Store at room temperature between 20 and 25 degrees C (68 and 77 degrees F). Get rid of any unused medicine after the expiration date. This medicine may cause harm and death if it is taken by other adults, children, or pets. It is important to get rid of the medicine as soon as you no longer need it or it is expired. You can do this in two ways:  Take the medicine to a medicine take-back program. Check with your pharmacy or law enforcement to find a location.  If you cannot return the medicine, check the label or package insert to see if the medicine should be thrown out in the garbage or flushed down the toilet. If you are not sure, ask your health care provider. If it is safe to put it in the trash, take the medicine out of the container. Mix the medicine with cat litter, dirt, coffee grounds, or other unwanted substance. Seal the mixture in a bag or container. Put it in the trash. NOTE: This sheet is a summary. It may not cover all possible information. If you have questions about this medicine, talk to your doctor, pharmacist, or health care provider.  2021 [employer]/Gold Standard (2020-08-25 13:35:28)

## 2020-12-21 ENCOUNTER — Other Ambulatory Visit: Payer: Self-pay

## 2020-12-21 ENCOUNTER — Encounter: Payer: Self-pay | Admitting: Dietician

## 2020-12-21 ENCOUNTER — Encounter: Payer: Medicaid Other | Attending: Internal Medicine | Admitting: Dietician

## 2020-12-21 VITALS — Ht 60.0 in | Wt 126.8 lb

## 2020-12-21 DIAGNOSIS — K3184 Gastroparesis: Secondary | ICD-10-CM | POA: Diagnosis present

## 2020-12-21 NOTE — Progress Notes (Signed)
Medical Nutrition Therapy: Visit start time: 1500  end time: 0177  Assessment:  Diagnosis: gastroparesis Medical history changes: no changes Psychosocial issues/ stress concerns: history of anxiety and trouble sleeping per patient  Current weight: 126.8lbs Height: 5'0" Medications, supplement changes: no changes per patient  Progress and evaluation:  . Patient reports some GI improvement, has not vomited after eating since previous visit; she is using Tums prn after eating; has Famotidine and takes nightly . Tried some Chobani yogurt and Kefir (did not care for Kefir); does like unsweetened oat milk. Has begun looking for low fat cheeses; has not yet tried breakfast / meal replacment drink. . She is sometimes eating ice cream after dinner.  . Continues to have bowel movements several times daily. . Patient voices struggle with having to limit or avoid multiple favorite foods with gastroparesis diet.   Physical activity: not assessed today  Dietary Intake:  Usual eating pattern includes 2 meals and 1 snacks per day. Dining out frequency: not assessed today.  Breakfast: 11am bagel, 1 c coffee Snack: none Lunch: banana, crackers Snack: none Supper: 2/27-- leftover chinese sesame shrimp (2pcs), white rice; pasta with alfredo sauce (to avoid tomato based sauce) Snack: occasionally ice cream; grapefruit (felt ill afterwards) Beverages: water, coffee in am, ginger ale  Nutrition Care Education: Topics covered:  Gastroparesis: reviewed importance of avoiding foods that take longer to digest including fats, fiber, and large portions; discussed benefit of adhering to gastroparesis guidelines to prevent worsening symptoms and for any potential healing of GI irritation. Reviewed beano supplement as possible diet aid for gas and bloating; reviewed importance of small, frequent meals and snacks to achieve adequate daily nutrition  Nutritional Diagnosis:  [place]-1.4 Altered GI function As related to  gastroparesis.  As evidenced by patient report of early satiety and GI distress after eating. [id number] Predicted suboptimal nutrient intake As related to skipped meals, poor appetite.  As evidenced by patient report of dietary intake.  Intervention:  . Instruction and discussion as noted above. . Patient will continue to work on goals as set at initial visit; updated today. . No additional follow up scheduled at this time; patient to schedule later if needed.  Education Materials given:  Marland Kitchen Visit summary with goals/ instructions   Learner/ who was taught:  . Patient  . Spouse/ partner  Level of understanding: Marland Kitchen Verbalizes/ demonstrates competency   Demonstrated degree of understanding via:   Teach back Learning barriers: . None  Willingness to learn/ readiness for change: . Eager, gradual change in progress   Monitoring and Evaluation:  Dietary intake, exercise, GI symptoms, and body weight      follow up: prn

## 2020-12-21 NOTE — Patient Instructions (Signed)
   Keep working to choose low fat foods -- "light" or 2% cheese, lowfat ice cream, only very small amounts of any creamy sauce like alfredo.   Try a [employer] essentials drink when not hungry for a meal or snack.   Talk to the doctor about trying Beano with meals.

## 2020-12-24 ENCOUNTER — Encounter: Payer: Self-pay | Admitting: Family Medicine

## 2020-12-30 ENCOUNTER — Ambulatory Visit
Admission: RE | Admit: 2020-12-30 | Discharge: 2020-12-30 | Disposition: A | Discharge status: Home / Self Care | Payer: Medicaid Other | Source: Ambulatory Visit | Attending: Family Medicine | Admitting: Family Medicine

## 2020-12-30 ENCOUNTER — Encounter: Payer: Self-pay | Admitting: Dermatology

## 2020-12-30 DIAGNOSIS — M5416 Radiculopathy, lumbar region: Secondary | ICD-10-CM

## 2020-12-30 MED ORDER — METHYLPREDNISOLONE ACETATE 40 MG/ML INJ SUSP (RADIOLOG
120.0000 mg | Freq: Once | INTRAMUSCULAR | Status: AC
  Administered 2020-12-30: 120 mg via EPIDURAL

## 2020-12-30 MED ORDER — IOPAMIDOL (ISOVUE-M 200) INJECTION 41%
1.0000 mL | Freq: Once | INTRAMUSCULAR | Status: AC
  Administered 2020-12-30: 1 mL via EPIDURAL

## 2020-12-30 NOTE — Discharge Instructions (Signed)

## 2021-01-10 NOTE — Progress Notes (Signed)
[place] Phone: [phone] Subjective:   [name], am serving as a scribe for Dr. Hulan Saas. This visit occurred during the SARS-CoV-2 public health emergency.  Safety protocols were in place, including screening questions prior to the visit, additional usage of staff PPE, and extensive cleaning of exam room while observing appropriate contact time as indicated for disinfecting solutions.   I'm seeing this patient by the request  of:  Adin Hector, MD  CC: low back pain follow up   [id number]  Nihira Lunden is a 55 y.o. female coming in with complaint of back and neck pain. OMT 12/10/2020. Patient states that she continues to have tingling in left leg and lumbar spine pain. Symptoms are better after the epidural.    Medications patient has been prescribed: Tramadol, Gabapentin  Taking:  Epidural 12/30/20   MRI lumbar- showed severe spinal stenosis L4/5     Reviewed prior external information including notes and imaging from previsou exam, outside providers and external EMR if available.   As well as notes that were available from care everywhere and other healthcare systems.  Past medical history, social, surgical and family history all reviewed in electronic medical record.  No pertanent information unless stated regarding to the chief complaint.   Past Medical History:  Diagnosis Date  . Anemia   . Anxiety   . Asthma   . Chronic insomnia   . Depression   . Dermatitis   . Gastroparesis   . GERD (gastroesophageal reflux disease)   . Hypertension   . Hypothyroidism   . Rhinitis     Allergies  Allergen Reactions  . Latex Rash  . Shellfish Allergy Rash    Not all the time   . Amoxicillin-Pot Clavulanate     unknown     Review of Systems:  No headache, visual changes, nausea, vomiting, diarrhea, constipation, dizziness, abdominal pain, skin rash, fevers, chills, night  sweats, weight loss, swollen lymph nodes,, joint swelling, chest pain, shortness of breath, mood changes. POSITIVE muscle aches, body aches  Objective  Blood pressure 122/82, pulse (!) 120, height 5' (1.524 m), weight 129 lb (58.5 kg), SpO2 99 %.   General: No apparent distress alert and oriented x3 mood and affect normal, dressed appropriately.  HEENT: Pupils equal, extraocular movements intact  Respiratory: Patient's speak in full sentences and does not appear short of breath  Cardiovascular: No lower extremity edema, non tender, no erythema  Gait normal with good balance and coordination.  MSK: Continued pain in the soft tissues even to light palpation.  Patient does have tightness of multiple different areas. Back -back exam still has some loss of lordosis.  Tightness with straight leg test.  Worsening pain with any extension of the back greater than 10 degrees.  Patient is now neurovascularly intact distally.  Osteopathic findings  C2 flexed rotated and side bent right C6 flexed rotated and side bent left T3 extended rotated and side bent right inhaled rib T9 extended rotated and side bent left L2 flexed rotated and side bent right Sacrum right on right       Assessment and Plan:  Spinal stenosis, lumbar region, with neurogenic claudication Patient does have some tramadol and can use it as needed.  In 1 to avoid using it too much.  Given short courses of prednisone with patient traveling out of the country.  Continuing on the Cymbalta.  Discussed icing regimen and home  exercises.  Patient will be following up with me again when she returns to the country.  I do believe that possible surgical intervention will be necessary.  Patient may have better coverage insurance wise in July    Nonallopathic problems  Decision today to treat with OMT was based on Physical Exam  After verbal consent patient was treated with  ME, FPR techniques in cervical, rib, thoracic, lumbar, and sacral   areas  Patient tolerated the procedure well with improvement in symptoms  Patient given exercises, stretches and lifestyle modifications  See medications in patient instructions if given  Patient will follow up in 4-8 weeks      The above documentation has been reviewed and is accurate and complete Lyndal Pulley, DO       Note: This dictation was prepared with Dragon dictation along with smaller phrase technology. Any transcriptional errors that result from this process are unintentional.

## 2021-01-11 ENCOUNTER — Encounter: Payer: Self-pay | Admitting: Family Medicine

## 2021-01-11 ENCOUNTER — Ambulatory Visit (INDEPENDENT_AMBULATORY_CARE_PROVIDER_SITE_OTHER): Payer: Medicaid Other | Admitting: Family Medicine

## 2021-01-11 ENCOUNTER — Other Ambulatory Visit: Payer: Self-pay

## 2021-01-11 VITALS — BP 122/82 | HR 120 | Ht 60.0 in | Wt 129.0 lb

## 2021-01-11 DIAGNOSIS — M999 Biomechanical lesion, unspecified: Secondary | ICD-10-CM

## 2021-01-11 DIAGNOSIS — M48062 Spinal stenosis, lumbar region with neurogenic claudication: Secondary | ICD-10-CM

## 2021-01-11 MED ORDER — PREDNISONE 20 MG PO TABS: 20.0000 mg | ORAL_TABLET | Freq: Every day | ORAL | 0 refills | Status: DC

## 2021-01-11 NOTE — Assessment & Plan Note (Signed)
Patient does have some tramadol and can use it as needed.  In 1 to avoid using it too much.  Given short courses of prednisone with patient traveling out of the country.  Continuing on the Cymbalta.  Discussed icing regimen and home exercises.  Patient will be following up with me again when she returns to the country.  I do believe that possible surgical intervention will be necessary.  Patient may have better coverage insurance wise in July

## 2021-01-11 NOTE — Patient Instructions (Signed)
Good to see you Take prednisone for 5 days We need to consider talking to neurosurgery See me again when you are back in the country

## 2021-03-16 ENCOUNTER — Encounter: Payer: Self-pay | Admitting: Family Medicine

## 2021-03-18 ENCOUNTER — Other Ambulatory Visit: Payer: Self-pay

## 2021-03-18 DIAGNOSIS — M5417 Radiculopathy, lumbosacral region: Secondary | ICD-10-CM

## 2021-04-02 NOTE — Progress Notes (Signed)
[place] [address] [place] Phone: [phone] Subjective:   I Kandace Blitz am serving as a Education administrator for Dr. Hulan Saas.  This visit occurred during the SARS-CoV-2 public health emergency.  Safety protocols were in place, including screening questions prior to the visit, additional usage of staff PPE, and extensive cleaning of exam room while observing appropriate contact time as indicated for disinfecting solutions.   I'm seeing this patient by the request  of:  Adin Hector, MD  CC: Low back pain follow-up  [id number]  Romayne Marlar is a 55 y.o. female coming in with complaint of back and neck pain. OMT 01/11/2021. Patient was referred to [facility].  Patient has been out of the country for the last 2 months.  Patient states she will be having surgery.  Reviewed patient's notes from neurosurgery.  We did discuss the possibility of doing a decompression in the near future.  Medications patient has been prescribed: Gabapentin  Taking:   MRI of the lumbar spine has shown the patient does haveMultilevel degenerative changes of the lumbar spine worse at L4-L5 with severe spinal stenosis.  Patient has gotten some relief from epidurals last 1 December 30, 2020 but seems to be short-lived.     Reviewed prior external information including notes and imaging from previsou exam, outside providers and external EMR if available.   As well as notes that were available from care everywhere and other healthcare systems.  Past medical history, social, surgical and family history all reviewed in electronic medical record.  No pertanent information unless stated regarding to the chief complaint.   Past Medical History:  Diagnosis Date   Anemia    Anxiety    Asthma    Chronic insomnia    Depression    Dermatitis    Gastroparesis    GERD (gastroesophageal reflux disease)    Hypertension    Hypothyroidism    Rhinitis      Allergies  Allergen Reactions   Latex Rash   Shellfish Allergy Rash    Not all the time    Amoxicillin-Pot Clavulanate     unknown     Review of Systems:  No headache, visual changes, nausea, vomiting, diarrhea, constipation, dizziness, abdominal pain, skin rash, fevers, chills, night sweats, weight loss, swollen lymph nodes, body aches, joint swelling, chest pain, shortness of breath, mood changes. POSITIVE muscle aches  Objective  Blood pressure 100/82, pulse 73, height 5' (1.524 m), weight 123 lb (55.8 kg), SpO2 99 %.   General: No apparent distress alert and oriented x3 mood and affect normal, dressed appropriately.  HEENT: Pupils equal, extraocular movements intact  Respiratory: Patient's speak in full sentences and does not appear short of breath  Cardiovascular: No lower extremity edema, non tender, no erythema  Gait normal with good balance and coordination.  MSK:  Non tender with full range of motion and good stability and symmetric strength and tone of shoulders, elbows, wrist, hip, knee and ankles bilaterally.  Back -low back does have significant loss of lordosis.  Patient does have tightness noted with Corky Sox as well as straight leg test.  Worsening pain of any extension greater than 5 degrees.  Osteopathic findings  C2 flexed rotated and side bent right C6 flexed rotated and side bent left T3 extended rotated and side bent right inhaled rib T9 extended rotated and side bent left L2 flexed rotated and side bent right Sacrum right on right  Assessment and Plan:  Spinal stenosis, lumbar region, with neurogenic claudication Significant at this time.  Patient is having pain and failed all conservative therapy.  Attempted osteopathic manipulation but not optimistic it would make significant progress at this time.  Patient has seen neurosurgery and they are discussing the surgery for the decompression of the spinal stenosis which I think is beneficial for this  individual.  Patient understands this.  We will follow-up with me at least 6 weeks if not 12 weeks after surgery.  Due to the amount of pain Toradol and Depo-Medrol given today.   Nonallopathic problems  Decision today to treat with OMT was based on Physical Exam  After verbal consent patient was treated with HVLA, ME, FPR techniques in cervical, rib, thoracic, lumbar, and sacral  areas  Patient tolerated the procedure well with improvement in symptoms  Patient given exercises, stretches and lifestyle modifications  See medications in patient instructions if given  Patient will follow up in 4-8 weeks      The above documentation has been reviewed and is accurate and complete Lyndal Pulley, DO       Note: This dictation was prepared with Dragon dictation along with smaller phrase technology. Any transcriptional errors that result from this process are unintentional.

## 2021-04-05 ENCOUNTER — Ambulatory Visit: Payer: Medicaid Other | Admitting: Family Medicine

## 2021-04-05 ENCOUNTER — Other Ambulatory Visit: Payer: Self-pay

## 2021-04-05 ENCOUNTER — Encounter: Payer: Self-pay | Admitting: Family Medicine

## 2021-04-05 VITALS — BP 100/82 | HR 73 | Ht 60.0 in | Wt 123.0 lb

## 2021-04-05 DIAGNOSIS — M2550 Pain in unspecified joint: Secondary | ICD-10-CM | POA: Diagnosis not present

## 2021-04-05 DIAGNOSIS — M9904 Segmental and somatic dysfunction of sacral region: Secondary | ICD-10-CM

## 2021-04-05 DIAGNOSIS — M9902 Segmental and somatic dysfunction of thoracic region: Secondary | ICD-10-CM | POA: Diagnosis not present

## 2021-04-05 DIAGNOSIS — M9908 Segmental and somatic dysfunction of rib cage: Secondary | ICD-10-CM

## 2021-04-05 DIAGNOSIS — M48062 Spinal stenosis, lumbar region with neurogenic claudication: Secondary | ICD-10-CM | POA: Diagnosis not present

## 2021-04-05 DIAGNOSIS — M9903 Segmental and somatic dysfunction of lumbar region: Secondary | ICD-10-CM

## 2021-04-05 DIAGNOSIS — M9901 Segmental and somatic dysfunction of cervical region: Secondary | ICD-10-CM

## 2021-04-05 MED ORDER — METHYLPREDNISOLONE ACETATE 40 MG/ML IJ SUSP
40.0000 mg | Freq: Once | INTRAMUSCULAR | Status: AC
  Administered 2021-04-05: 40 mg via INTRAMUSCULAR

## 2021-04-05 MED ORDER — KETOROLAC TROMETHAMINE 30 MG/ML IJ SOLN
30.0000 mg | Freq: Once | INTRAMUSCULAR | Status: AC
  Administered 2021-04-05: 30 mg via INTRAMUSCULAR

## 2021-04-05 NOTE — Patient Instructions (Signed)
Good to see you See me again in  

## 2021-04-05 NOTE — Assessment & Plan Note (Addendum)
Significant at this time.  Patient is having pain and failed all conservative therapy.  Attempted osteopathic manipulation but not optimistic it would make significant progress at this time.  Patient has seen neurosurgery and they are discussing the surgery for the decompression of the spinal stenosis which I think is beneficial for this individual.  Patient understands this.  We will follow-up with me at least 6 weeks if not 12 weeks after surgery.  Due to the amount of pain Toradol and Depo-Medrol given today.

## 2021-04-06 ENCOUNTER — Other Ambulatory Visit: Payer: Self-pay | Admitting: Neurosurgery

## 2021-04-19 NOTE — Pre-Procedure Instructions (Signed)
Surgical Instructions    Your procedure is scheduled on Thursday June 30th.  Report to [facility] Main Entrance "A" at 05:30 A.M., then check in with the Admitting office.  Call this number if you have problems the morning of surgery:  [phone]   If you have any questions prior to your surgery date call [phone]: Open Monday-Friday 8am-4pm    Remember:  Do not eat or drink after midnight the night before your surgery    Take these medicines the morning of surgery with A SIP OF WATER   albuterol (VENTOLIN HFA)- If needed  carboxymethylcellulose (REFRESH PLUS)- If needed  cetirizine (ZYRTEC)- If needed  dexlansoprazole (DEXILANT)  DULoxetine (CYMBALTA)  gabapentin (NEURONTIN)   levothyroxine (SYNTHROID)   metoprolol succinate (TOPROL-XL)   traMADol (ULTRAM)- If needed  As of today, STOP taking any Aspirin (unless otherwise instructed by your surgeon) Aleve, Naproxen, Ibuprofen, Motrin, Advil, Goody's, BC's, all herbal medications, fish oil, and all vitamins.                     Do NOT Smoke (Tobacco/Vaping) or drink Alcohol 24 hours prior to your procedure.  If you use a CPAP at night, you may bring all equipment for your overnight stay.   Contacts, glasses, piercing's, hearing aid's, dentures or partials may not be worn into surgery, please bring cases for these belongings.    For patients admitted to the hospital, discharge time will be determined by your treatment team.   Patients discharged the day of surgery will not be allowed to drive home, and someone needs to stay with them for 24 hours.  ONLY 1 SUPPORT PERSON MAY BE PRESENT WHILE YOU ARE IN SURGERY. IF YOU ARE TO BE ADMITTED ONCE YOU ARE IN YOUR ROOM YOU WILL BE ALLOWED TWO (2) VISITORS.  Minor children may have two parents present. Special consideration for safety and communication needs will be reviewed on a case by case basis.   Special instructions:   [facility]- Preparing For Surgery  Before surgery,  you can play an important role. Because skin is not sterile, your skin needs to be as free of germs as possible. You can reduce the number of germs on your skin by washing with CHG (chlorahexidine gluconate) Soap before surgery.  CHG is an antiseptic cleaner which kills germs and bonds with the skin to continue killing germs even after washing.    Oral Hygiene is also important to reduce your risk of infection.  Remember - BRUSH YOUR TEETH THE MORNING OF SURGERY WITH YOUR REGULAR TOOTHPASTE  Please do not use if you have an allergy to CHG or antibacterial soaps. If your skin becomes reddened/irritated stop using the CHG.  Do not shave (including legs and underarms) for at least 48 hours prior to first CHG shower. It is OK to shave your face.  Please follow these instructions carefully.   Shower the NIGHT BEFORE SURGERY and the MORNING OF SURGERY  If you chose to wash your hair, wash your hair first as usual with your normal shampoo.  After you shampoo, rinse your hair and body thoroughly to remove the shampoo.  Use CHG Soap as you would any other liquid soap. You can apply CHG directly to the skin and wash gently with a scrungie or a clean washcloth.   Apply the CHG Soap to your body ONLY FROM THE NECK DOWN.  Do not use on open wounds or open sores. Avoid contact with your eyes, ears, mouth  and genitals (private parts). Wash Face and genitals (private parts)  with your normal soap.   Wash thoroughly, paying special attention to the area where your surgery will be performed.  Thoroughly rinse your body with warm water from the neck down.  DO NOT shower/wash with your normal soap after using and rinsing off the CHG Soap.  Pat yourself dry with a CLEAN TOWEL.  Wear CLEAN PAJAMAS to bed the night before surgery  Place CLEAN SHEETS on your bed the night before your surgery  DO NOT SLEEP WITH PETS.   Day of Surgery: Shower with CHG soap. Do not wear jewelry, make up, nail polish, gel  polish, artificial nails, or any other type of covering on natural nails including finger and toenails. If patients have artificial nails, gel coating, etc. that need to be removed by a nail salon please have this removed prior to surgery. Surgery may need to be canceled/delayed if the surgeon/ anesthesia feels like the patient is unable to be adequately monitored. Do not wear lotions, powders, perfumes/colognes, or deodorant. Do not shave 48 hours prior to surgery.  Men may shave face and neck. Do not bring valuables to the hospital. [facility] is not responsible for any belongings or valuables. Wear Clean/Comfortable clothing the morning of surgery Remember to brush your teeth WITH YOUR REGULAR TOOTHPASTE.   Please read over the following fact sheets that you were given.

## 2021-04-20 ENCOUNTER — Other Ambulatory Visit: Payer: Self-pay

## 2021-04-20 ENCOUNTER — Encounter (HOSPITAL_COMMUNITY): Payer: Self-pay

## 2021-04-20 ENCOUNTER — Encounter (HOSPITAL_COMMUNITY)
Admission: RE | Admit: 2021-04-20 | Discharge: 2021-04-20 | Disposition: A | Discharge status: Home / Self Care | Payer: Medicaid Other | Source: Ambulatory Visit | Attending: Neurosurgery | Admitting: Neurosurgery

## 2021-04-20 ENCOUNTER — Other Ambulatory Visit: Payer: Self-pay | Admitting: Neurosurgery

## 2021-04-20 DIAGNOSIS — Z01818 Encounter for other preprocedural examination: Secondary | ICD-10-CM | POA: Diagnosis not present

## 2021-04-20 DIAGNOSIS — Z20822 Contact with and (suspected) exposure to covid-19: Secondary | ICD-10-CM | POA: Insufficient documentation

## 2021-04-20 HISTORY — DX: Prediabetes: R73.03

## 2021-04-20 LAB — CBC
HCT: 41.5 % (ref 36.0–46.0)
Hemoglobin: 13.4 g/dL (ref 12.0–15.0)
MCH: 27.3 pg (ref 26.0–34.0)
MCHC: 32.3 g/dL (ref 30.0–36.0)
MCV: 84.7 fL (ref 80.0–100.0)
Platelets: 332 10*3/uL (ref 150–400)
RBC: 4.9 MIL/uL (ref 3.87–5.11)
RDW: 11.9 % (ref 11.5–15.5)
WBC: 8.1 10*3/uL (ref 4.0–10.5)
nRBC: 0 % (ref 0.0–0.2)

## 2021-04-20 LAB — GLUCOSE, CAPILLARY: Glucose-Capillary: 142 mg/dL — ABNORMAL HIGH (ref 70–99)

## 2021-04-20 LAB — BASIC METABOLIC PANEL
Anion gap: 8 (ref 5–15)
BUN: 12 mg/dL (ref 6–20)
CO2: 28 mmol/L (ref 22–32)
Calcium: 9.4 mg/dL (ref 8.9–10.3)
Chloride: 101 mmol/L (ref 98–111)
Creatinine, Ser: 0.55 mg/dL (ref 0.44–1.00)
GFR, Estimated: >60 mL/min (ref >60)
Glucose, Bld: 133 mg/dL — ABNORMAL HIGH (ref 70–99)
Potassium: 3.9 mmol/L (ref 3.5–5.1)
Sodium: 137 mmol/L (ref 135–145)

## 2021-04-20 LAB — SURGICAL PCR SCREEN
MRSA, PCR: NEGATIVE
Staphylococcus aureus: NEGATIVE

## 2021-04-20 LAB — SARS CORONAVIRUS 2 (TAT 6-24 HRS): SARS Coronavirus 2: NEGATIVE

## 2021-04-20 NOTE — Progress Notes (Signed)
PCP - Adin Hector, MD Cardiologist - denies  PPM/ICD - denies Device Orders - N/A Rep Notified - denies  Chest x-ray - N/A EKG - 04/20/2021 Stress Test - denies ECHO - denies Cardiac Cath - denies  Sleep Study - yes CPAP - N/A  Fasting Blood Sugar - patient is not checking CBG CBG today - 142 A1C - done on 04/20/2021  Blood Thinner Instructions: N/A  Aspirin Instructions: patient was instructed: As of today, STOP taking any Aspirin (unless otherwise instructed by your surgeon) Aleve, Naproxen, Ibuprofen, Motrin, Advil, Goody's, BC's, all herbal medications, fish oil, and all vitamins  ERAS Protcol - no PRE-SURGERY Ensure or G2- N/A  COVID TEST- 04/20/2021   Anesthesia review: no  Patient denies shortness of breath, fever, cough and chest pain at PAT appointment   All instructions explained to the patient, with a verbal understanding of the material. Patient agrees to go over the instructions while at home for a better understanding. Patient also instructed to self quarantine after being tested for COVID-19. The opportunity to ask questions was provided.

## 2021-04-21 LAB — HEMOGLOBIN A1C
Hgb A1c MFr Bld: 6.3 % — ABNORMAL HIGH (ref 4.8–5.6)
Mean Plasma Glucose: 134 mg/dL

## 2021-04-21 NOTE — H&P (Addendum)
CC: lumbar stenosis  HPI:     This is a 55 year old woman with HTN, fibromyalgia, IBS who presents for evaluation of back pain and bilateral leg pain.  For many years, she has had pain in her back which radiates across her hips and then goes down her legs.  It is somewhat worse on the left.  The pain is electrical or sharp in sensation.  Prolonged sitting or standing is painful and she finds it hard to walk for 10 minute straight.  She finds that flexing forward helps when she walks, such as leaning over a shopping cart.  She did physical therapy which did not help and a left L4-5 injection 3 months ago also did not help.  She states the pain is 8/10 in severity.    Patient Active Problem List   Diagnosis Date Noted   Spinal stenosis, lumbar region, with neurogenic claudication 12/10/2020   Carpal tunnel syndrome 10/28/2020   Shingles 09/09/2020   Diabetes mellitus without complication ([place]) 97/98/9211   Hypercholesteremia 04/07/2020   History of prediabetes 03/14/2020   Orthostatic hypotension 01/11/2020   Insomnia 01/05/2020   Suicidal ideation 12/26/2019   Nonallopathic lesion of cervical region 11/26/2019   Nonallopathic lesion of thoracic region 11/26/2019   Nonallopathic lesion of sacral region 11/26/2019   Nonallopathic lesion of lumbosacral region 11/26/2019   Nonallopathic lesion of rib cage 11/26/2019   Encounter for medication review and counseling 11/13/2019   Abnormal LFTs 11/13/2019   Thrombocytosis 11/13/2019   Fibromyalgia 10/29/2019   Chronic back pain 10/18/2019   Hypothyroidism 10/18/2019   Depression    Gastroparesis    Chronic idiopathic urticaria 03/28/2019   Anxiety 2020   Rash of face 10/10/2017   S/P cholecystectomy 05/13/2016   Chronic constipation 01/06/2016   DISH (diffuse idiopathic skeletal hyperostosis) 01/06/2016   Fibrocystic breast disease 01/06/2016   Migraine without aura 01/06/2016   Recurrent cold sores 01/06/2016   Panic disorder  without agoraphobia with mild panic attacks 01/06/2016   RLS (restless legs syndrome) 01/06/2016   Vertigo 01/06/2016   Vitamin B12 deficiency 01/06/2016   Vitamin D deficiency 01/06/2016   Lumbosacral radiculopathy at L5 11/25/2014   Right foot drop 11/25/2014   Elevated ferritin 07/07/2014   GAD (generalized anxiety disorder) 03/11/2014   MDD (major depressive disorder), recurrent episode, moderate ([place]) 03/11/2014   MDD (major depressive disorder), recurrent episode, severe ([place]) 01/09/2014   Allergic reaction to latex 01/07/2014   Rhinitis 01/07/2014   Myalgia and myositis, unspecified 12/17/2008   Essential hypertension 10/21/2008   Past Medical History:  Diagnosis Date   Anemia    Anxiety    Asthma    Chronic insomnia    Depression    Dermatitis    Gastroparesis    GERD (gastroesophageal reflux disease)    Hypertension    Hypothyroidism    Pre-diabetes    Rhinitis     Past Surgical History:  Procedure Laterality Date   APPENDECTOMY     CHOLECYSTECTOMY  2017   EYE SURGERY      No medications prior to admission.   Allergies  Allergen Reactions   Latex Rash   Shellfish Allergy Rash    Not all the time    Amoxicillin-Pot Clavulanate     unknown    Social History   Tobacco Use   Smoking status: Never   Smokeless tobacco: Never  Substance Use Topics   Alcohol use: No    Family History  Problem Relation Age of Onset  Breast cancer Neg Hx      Review of Systems Pertinent items are noted in HPI.  Objective:   No data found. No intake/output data recorded. No intake/output data recorded.    General : Alert, cooperative, no distress, appears stated age   Head:  Normocephalic/atraumatic    Eyes: PERRL, conjunctiva/corneas clear, EOM's intact. Fundi could not be visualized Neck: Supple Chest:  Respirations unlabored Chest wall: no tenderness or deformity Heart: Regular rate and rhythm Abdomen: Soft, nontender and nondistended Extremities: warm  and well-perfused Skin: normal turgor, color and texture Neurologic:  Alert, oriented x 3.  Eyes open spontaneously. PERRL, EOMI, VFC, no facial droop. V1-3 intact.  No dysarthria, tongue protrusion symmetric.  CNII-XII intact. Normal strength, sensation and reflexes throughout.  No pronator drift, full strength in legs. + L SLR.      Data ReviewCBC:  Lab Results  Component Value Date   WBC 8.1 04/20/2021   RBC 4.90 04/20/2021   BMP:  Lab Results  Component Value Date   GLUCOSE 133 (H) 04/20/2021   CO2 28 04/20/2021   BUN 12 04/20/2021   BUN 10 04/07/2020   CREATININE 0.55 04/20/2021   CALCIUM 9.4 04/20/2021   Radiology review:  MRI lumbar spine without contrast shows multilevel degenerative disc disease with disc bulging at L3-4, L4-5, and L5-S1.  At L3-4, there is moderate central stenosis and bilateral subarticular stenosis.  At L4-5, there is moderate to severe central and severe left subarticular and moderate to severe right subarticular stenosis.  There is no significant spondylolisthesis or malalignment.  Disc space height is well maintained.   Assessment:  Lumbar stenosis with claudication  Plan:  - plan for posterior lumbar decompression at L3-4, L4-5 -  I did explain that her back pain symptoms may or may not improve with surgery, and it is mostly the neurogenic and radiculopathic symptoms that would have the best chance of improving.  Risks, benefits, alternatives, expected convalescence were discussed.  The general technique of surgery discussed.  Risks discussed included, but were not limited to, bleeding, pain, infection, scar, spinal fluid leak, instability, neurologic deficit, coma, and death.  She and her husband wished to proceed with surgery.  Informed consent was obtained.  All questions and concerns were obtained.

## 2021-04-22 ENCOUNTER — Ambulatory Visit (HOSPITAL_COMMUNITY)
Admission: RE | Admit: 2021-04-22 | Discharge: 2021-04-22 | Disposition: A | Discharge status: Home / Self Care | Payer: Medicaid Other | Attending: Neurosurgery | Admitting: Neurosurgery

## 2021-04-22 ENCOUNTER — Ambulatory Visit (HOSPITAL_COMMUNITY): Payer: Medicaid Other | Admitting: Anesthesiology

## 2021-04-22 ENCOUNTER — Encounter (HOSPITAL_COMMUNITY)
Admission: RE | Disposition: A | Discharge status: Home / Self Care | Payer: Self-pay | Source: Home / Self Care | Attending: Neurosurgery

## 2021-04-22 ENCOUNTER — Ambulatory Visit (HOSPITAL_COMMUNITY): Payer: Medicaid Other

## 2021-04-22 ENCOUNTER — Encounter (HOSPITAL_COMMUNITY): Payer: Self-pay

## 2021-04-22 DIAGNOSIS — E1143 Type 2 diabetes mellitus with diabetic autonomic (poly)neuropathy: Secondary | ICD-10-CM | POA: Insufficient documentation

## 2021-04-22 DIAGNOSIS — I10 Essential (primary) hypertension: Secondary | ICD-10-CM | POA: Insufficient documentation

## 2021-04-22 DIAGNOSIS — Z880 Allergy status to penicillin: Secondary | ICD-10-CM | POA: Insufficient documentation

## 2021-04-22 DIAGNOSIS — E039 Hypothyroidism, unspecified: Secondary | ICD-10-CM | POA: Diagnosis not present

## 2021-04-22 DIAGNOSIS — K581 Irritable bowel syndrome with constipation: Secondary | ICD-10-CM | POA: Diagnosis not present

## 2021-04-22 DIAGNOSIS — M48062 Spinal stenosis, lumbar region with neurogenic claudication: Secondary | ICD-10-CM | POA: Diagnosis not present

## 2021-04-22 DIAGNOSIS — Z419 Encounter for procedure for purposes other than remedying health state, unspecified: Secondary | ICD-10-CM

## 2021-04-22 DIAGNOSIS — M797 Fibromyalgia: Secondary | ICD-10-CM | POA: Diagnosis not present

## 2021-04-22 DIAGNOSIS — Z91040 Latex allergy status: Secondary | ICD-10-CM | POA: Diagnosis not present

## 2021-04-22 DIAGNOSIS — Z91013 Allergy to seafood: Secondary | ICD-10-CM | POA: Insufficient documentation

## 2021-04-22 DIAGNOSIS — K3184 Gastroparesis: Secondary | ICD-10-CM | POA: Diagnosis not present

## 2021-04-22 HISTORY — PX: LUMBAR LAMINECTOMY/DECOMPRESSION MICRODISCECTOMY: SHX5026

## 2021-04-22 LAB — GLUCOSE, CAPILLARY: Glucose-Capillary: 128 mg/dL — ABNORMAL HIGH (ref 70–99)

## 2021-04-22 SURGERY — LUMBAR LAMINECTOMY/DECOMPRESSION MICRODISCECTOMY 2 LEVELS
Anesthesia: General | Site: Spine Lumbar

## 2021-04-22 MED ORDER — PROMETHAZINE HCL 25 MG/ML IJ SOLN: 6.2500 mg | INTRAMUSCULAR | Status: DC | PRN

## 2021-04-22 MED ORDER — CHLORHEXIDINE GLUCONATE CLOTH 2 % EX PADS: 6.0000 | MEDICATED_PAD | Freq: Once | CUTANEOUS | Status: DC

## 2021-04-22 MED ORDER — ROCURONIUM BROMIDE 10 MG/ML (PF) SYRINGE
PREFILLED_SYRINGE | INTRAVENOUS | Status: DC | PRN
  Administered 2021-04-22: 50 mg via INTRAVENOUS

## 2021-04-22 MED ORDER — PHENYLEPHRINE 40 MCG/ML (10ML) SYRINGE FOR IV PUSH (FOR BLOOD PRESSURE SUPPORT)
PREFILLED_SYRINGE | INTRAVENOUS | Status: DC | PRN
  Administered 2021-04-22: 80 ug via INTRAVENOUS

## 2021-04-22 MED ORDER — DEXAMETHASONE SODIUM PHOSPHATE 10 MG/ML IJ SOLN
INTRAMUSCULAR | Status: DC | PRN
  Administered 2021-04-22: 10 mg via INTRAVENOUS

## 2021-04-22 MED ORDER — ACETAMINOPHEN 325 MG PO TABS
325.0000 mg | ORAL_TABLET | Freq: Once | ORAL | Status: DC | PRN
Start: 2021-04-22 — End: 2021-04-22

## 2021-04-22 MED ORDER — OXYCODONE-ACETAMINOPHEN 5-325 MG PO TABS: 1.0000 | ORAL_TABLET | Freq: Four times a day (QID) | ORAL | Status: DC | PRN

## 2021-04-22 MED ORDER — 0.9 % SODIUM CHLORIDE (POUR BTL) OPTIME
TOPICAL | Status: DC | PRN
  Administered 2021-04-22: 1000 mL

## 2021-04-22 MED ORDER — PROPOFOL 10 MG/ML IV BOLUS
INTRAVENOUS | Status: DC | PRN
  Administered 2021-04-22: 120 mg via INTRAVENOUS

## 2021-04-22 MED ORDER — MEPERIDINE HCL 25 MG/ML IJ SOLN: 6.2500 mg | INTRAMUSCULAR | Status: DC | PRN

## 2021-04-22 MED ORDER — SCOPOLAMINE 1 MG/3DAYS TD PT72
1.0000 | MEDICATED_PATCH | TRANSDERMAL | Status: DC
  Administered 2021-04-22: 1.5 mg via TRANSDERMAL
  Filled 2021-04-22: qty 1

## 2021-04-22 MED ORDER — ACETAMINOPHEN 160 MG/5ML PO SOLN: 325.0000 mg | Freq: Once | ORAL | Status: DC | PRN

## 2021-04-22 MED ORDER — THROMBIN 5000 UNITS EX SOLR: OROMUCOSAL | Status: DC | PRN

## 2021-04-22 MED ORDER — MIDAZOLAM HCL 2 MG/2ML IJ SOLN
INTRAMUSCULAR | Status: AC
  Filled 2021-04-22: qty 2

## 2021-04-22 MED ORDER — BUPIVACAINE HCL (PF) 0.5 % IJ SOLN
INTRAMUSCULAR | Status: DC | PRN
  Administered 2021-04-22: 20 mL

## 2021-04-22 MED ORDER — LACTATED RINGERS IV SOLN: INTRAVENOUS | Status: DC

## 2021-04-22 MED ORDER — LIDOCAINE-EPINEPHRINE 1 %-1:100000 IJ SOLN
INTRAMUSCULAR | Status: AC
  Filled 2021-04-22: qty 1

## 2021-04-22 MED ORDER — DEXAMETHASONE SODIUM PHOSPHATE 10 MG/ML IJ SOLN
INTRAMUSCULAR | Status: AC
  Filled 2021-04-22: qty 1

## 2021-04-22 MED ORDER — ONDANSETRON HCL 4 MG/2ML IJ SOLN
INTRAMUSCULAR | Status: DC | PRN
  Administered 2021-04-22: 4 mg via INTRAVENOUS

## 2021-04-22 MED ORDER — HYDROMORPHONE HCL 1 MG/ML IJ SOLN
INTRAMUSCULAR | Status: AC
  Filled 2021-04-22: qty 1

## 2021-04-22 MED ORDER — BUPIVACAINE HCL (PF) 0.5 % IJ SOLN
INTRAMUSCULAR | Status: AC
  Filled 2021-04-22: qty 30

## 2021-04-22 MED ORDER — PROPOFOL 10 MG/ML IV BOLUS
INTRAVENOUS | Status: AC
  Filled 2021-04-22: qty 20

## 2021-04-22 MED ORDER — HYDROMORPHONE HCL 1 MG/ML IJ SOLN
0.2500 mg | INTRAMUSCULAR | Status: DC | PRN
  Administered 2021-04-22 (×2): 0.5 mg via INTRAVENOUS

## 2021-04-22 MED ORDER — THROMBIN 5000 UNITS EX SOLR
CUTANEOUS | Status: DC | PRN
  Administered 2021-04-22 (×2): 5000 [IU] via TOPICAL

## 2021-04-22 MED ORDER — ROCURONIUM BROMIDE 10 MG/ML (PF) SYRINGE
PREFILLED_SYRINGE | INTRAVENOUS | Status: AC
  Filled 2021-04-22: qty 10

## 2021-04-22 MED ORDER — MIDAZOLAM HCL 2 MG/2ML IJ SOLN
INTRAMUSCULAR | Status: DC | PRN
  Administered 2021-04-22: 2 mg via INTRAVENOUS

## 2021-04-22 MED ORDER — ACETAMINOPHEN 10 MG/ML IV SOLN
1000.0000 mg | Freq: Once | INTRAVENOUS | Status: DC | PRN
  Administered 2021-04-22: 1000 mg via INTRAVENOUS

## 2021-04-22 MED ORDER — AMISULPRIDE (ANTIEMETIC) 5 MG/2ML IV SOLN: 10.0000 mg | Freq: Once | INTRAVENOUS | Status: DC | PRN

## 2021-04-22 MED ORDER — ORAL CARE MOUTH RINSE: 15.0000 mL | Freq: Once | OROMUCOSAL | Status: AC

## 2021-04-22 MED ORDER — OXYCODONE-ACETAMINOPHEN 5-325 MG PO TABS: 1.0000 | ORAL_TABLET | Freq: Four times a day (QID) | ORAL | 0 refills | Status: DC | PRN

## 2021-04-22 MED ORDER — THROMBIN 5000 UNITS EX SOLR
CUTANEOUS | Status: AC
  Filled 2021-04-22: qty 5000

## 2021-04-22 MED ORDER — LIDOCAINE 2% (20 MG/ML) 5 ML SYRINGE
INTRAMUSCULAR | Status: AC
  Filled 2021-04-22: qty 5

## 2021-04-22 MED ORDER — THROMBIN 5000 UNITS EX SOLR
CUTANEOUS | Status: AC
  Filled 2021-04-22: qty 10000

## 2021-04-22 MED ORDER — CYCLOBENZAPRINE HCL 10 MG PO TABS: 10.0000 mg | ORAL_TABLET | Freq: Three times a day (TID) | ORAL | 0 refills | Status: DC | PRN

## 2021-04-22 MED ORDER — CYCLOBENZAPRINE HCL 10 MG PO TABS: 10.0000 mg | ORAL_TABLET | Freq: Three times a day (TID) | ORAL | Status: DC | PRN

## 2021-04-22 MED ORDER — FENTANYL CITRATE (PF) 250 MCG/5ML IJ SOLN
INTRAMUSCULAR | Status: AC
  Filled 2021-04-22: qty 5

## 2021-04-22 MED ORDER — VANCOMYCIN HCL IN DEXTROSE 1-5 GM/200ML-% IV SOLN
1000.0000 mg | INTRAVENOUS | Status: AC
  Administered 2021-04-22: 1000 mg via INTRAVENOUS
  Filled 2021-04-22: qty 200

## 2021-04-22 MED ORDER — CHLORHEXIDINE GLUCONATE 0.12 % MT SOLN
15.0000 mL | Freq: Once | OROMUCOSAL | Status: AC
  Administered 2021-04-22: 15 mL via OROMUCOSAL
  Filled 2021-04-22: qty 15

## 2021-04-22 MED ORDER — FENTANYL CITRATE (PF) 250 MCG/5ML IJ SOLN
INTRAMUSCULAR | Status: DC | PRN
  Administered 2021-04-22: 100 ug via INTRAVENOUS

## 2021-04-22 MED ORDER — LIDOCAINE-EPINEPHRINE 1 %-1:100000 IJ SOLN
INTRAMUSCULAR | Status: DC | PRN
  Administered 2021-04-22: 8 mL

## 2021-04-22 MED ORDER — LIDOCAINE 2% (20 MG/ML) 5 ML SYRINGE
INTRAMUSCULAR | Status: DC | PRN
  Administered 2021-04-22: 40 mg via INTRAVENOUS

## 2021-04-22 MED ORDER — METHYLPREDNISOLONE ACETATE 80 MG/ML IJ SUSP
INTRAMUSCULAR | Status: AC
  Filled 2021-04-22: qty 1

## 2021-04-22 MED ORDER — ACETAMINOPHEN 10 MG/ML IV SOLN
INTRAVENOUS | Status: AC
  Filled 2021-04-22: qty 100

## 2021-04-22 MED ORDER — THROMBIN 5000 UNITS EX SOLR
CUTANEOUS | Status: AC
  Filled 2021-04-22: qty 15000

## 2021-04-22 SURGICAL SUPPLY — 50 items
BAG COUNTER SPONGE SURGICOUNT (BAG) ×2 IMPLANT
BAND RUBBER #18 3X1/16 STRL (MISCELLANEOUS) ×4 IMPLANT
BENZOIN TINCTURE PRP APPL 2/3 (GAUZE/BANDAGES/DRESSINGS) ×2 IMPLANT
BLADE CLIPPER SURG (BLADE) IMPLANT
BUR CARBIDE MATCH 3.0 (BURR) ×2 IMPLANT
BUR PRECISION FLUTE 5.0 (BURR) ×2 IMPLANT
CANISTER SUCT 3000ML PPV (MISCELLANEOUS) ×2 IMPLANT
DECANTER SPIKE VIAL GLASS SM (MISCELLANEOUS) IMPLANT
DERMABOND ADVANCED (GAUZE/BANDAGES/DRESSINGS) ×1
DERMABOND ADVANCED .7 DNX12 (GAUZE/BANDAGES/DRESSINGS) ×1 IMPLANT
DRAPE LAPAROTOMY 100X72X124 (DRAPES) ×2 IMPLANT
DRAPE MICROSCOPE LEICA (MISCELLANEOUS) ×2 IMPLANT
DRAPE SURG 17X23 STRL (DRAPES) ×2 IMPLANT
DRSG MEPILEX BORDER 4X4 (GAUZE/BANDAGES/DRESSINGS) IMPLANT
DRSG OPSITE POSTOP 3X4 (GAUZE/BANDAGES/DRESSINGS) ×2 IMPLANT
DRSG OPSITE POSTOP 4X6 (GAUZE/BANDAGES/DRESSINGS) ×2 IMPLANT
DURAPREP 26ML APPLICATOR (WOUND CARE) ×2 IMPLANT
ELECT REM PT RETURN 9FT ADLT (ELECTROSURGICAL) ×2
ELECTRODE REM PT RTRN 9FT ADLT (ELECTROSURGICAL) ×1 IMPLANT
GAUZE 4X4 16PLY ~~LOC~~+RFID DBL (SPONGE) IMPLANT
GLOVE EXAM NITRILE XL STR (GLOVE) IMPLANT
GLOVE SURG LTX SZ7.5 (GLOVE) ×2 IMPLANT
GLOVE SURG POLYISO LF SZ7.5 (GLOVE) ×8 IMPLANT
GLOVE SURG UNDER POLY LF SZ7.5 (GLOVE) ×6 IMPLANT
GOWN STRL REUS W/ TWL LRG LVL3 (GOWN DISPOSABLE) ×2 IMPLANT
GOWN STRL REUS W/ TWL XL LVL3 (GOWN DISPOSABLE) ×2 IMPLANT
GOWN STRL REUS W/TWL 2XL LVL3 (GOWN DISPOSABLE) IMPLANT
GOWN STRL REUS W/TWL LRG LVL3 (GOWN DISPOSABLE) ×2
GOWN STRL REUS W/TWL XL LVL3 (GOWN DISPOSABLE) ×2
HEMOSTAT POWDER KIT SURGIFOAM (HEMOSTASIS) ×2 IMPLANT
KIT BASIN OR (CUSTOM PROCEDURE TRAY) ×2 IMPLANT
KIT TURNOVER KIT B (KITS) ×2 IMPLANT
NEEDLE HYPO 22GX1.5 SAFETY (NEEDLE) ×2 IMPLANT
NEEDLE SPNL 18GX3.5 QUINCKE PK (NEEDLE) ×2 IMPLANT
NS IRRIG 1000ML POUR BTL (IV SOLUTION) ×2 IMPLANT
PACK LAMINECTOMY NEURO (CUSTOM PROCEDURE TRAY) ×2 IMPLANT
PAD ARMBOARD 7.5X6 YLW CONV (MISCELLANEOUS) ×8 IMPLANT
SPONGE SURGIFOAM ABS GEL SZ50 (HEMOSTASIS) ×2 IMPLANT
SPONGE T-LAP 4X18 ~~LOC~~+RFID (SPONGE) IMPLANT
STRIP CLOSURE SKIN 1/2X4 (GAUZE/BANDAGES/DRESSINGS) ×2 IMPLANT
SUT MNCRL AB 4-0 PS2 18 (SUTURE) ×2 IMPLANT
SUT VIC AB 0 CT1 18XCR BRD8 (SUTURE) ×2 IMPLANT
SUT VIC AB 0 CT1 8-18 (SUTURE) ×2
SUT VIC AB 2-0 CP2 18 (SUTURE) ×4 IMPLANT
SYR 3ML LL SCALE MARK (SYRINGE) IMPLANT
TIP KERRISON THIN FOOTPLATE 3M (MISCELLANEOUS) ×2 IMPLANT
TIP KERRISON THIN FOOTPLATE 4M (MISCELLANEOUS) ×2 IMPLANT
TOWEL GREEN STERILE (TOWEL DISPOSABLE) ×2 IMPLANT
TOWEL GREEN STERILE FF (TOWEL DISPOSABLE) ×2 IMPLANT
WATER STERILE IRR 1000ML POUR (IV SOLUTION) ×2 IMPLANT

## 2021-04-22 NOTE — Discharge Instructions (Addendum)
Can remove transparent dressing and shower 48 hours after surgery Walk as much as possible No heavy lifting >10 lbs No excessive bending/twisting at the waist  Pain medication and muscle relaxant has been sent electronically to your pharmacy

## 2021-04-22 NOTE — Op Note (Signed)
PREOP DIAGNOSIS: Lumbar spinal stenosis, L3-4, L4-5  POSTOP DIAGNOSIS: Lumbar spinal stenosis, L3-4, L4-5  PROCEDURE: 1. L4-5 laminectomy, medial facetectomy, and foraminotomies 2. L3-4 laminectomy, medial facetectomy, and foraminotomies  SURGEON: Dr. Duffy Rhody, MD  ASSISTANT: None   ANESTHESIA: General Endotracheal  EBL: 50 ml  SPECIMENS: None  DRAINS: None  COMPLICATIONS: none  CONDITION: Stable to PCAU  HISTORY: Meredith [name] is a 55 y.o. female who initially presented to the outpatient clinic with symptoms consistent with neurogenic claudication.  Previous therapies including epidural injections did not help her pain.  MRI showed severe stenosis at L4-5 and moderate to severe stenosis at L3-4 due to combination of bilateral facet hypertrophy, ligamentum flavum hypertrophy, and bulging disc. Treatment options were discussed and the patient elected to proceed with surgical decompression by laminectomy and foraminotomies at these levels.  Risk, benefits, alternatives, and expected convalescence were discussed with the patient.  Risks discussed included, but were not limited to, bleeding, pain, infection, scar, spinal fluid leak, instability, neurologic deficit, and death.  Informed consent was obtained.  PROCEDURE IN DETAIL: After informed consent was obtained and witnessed, the patient was brought to the operating room. After induction of general anesthesia, the patient was positioned on the operative table in the prone position with all pressure points meticulously padded. The skin of the low back was then prepped and draped in the usual sterile fashion.  Using preoperative x-ray and spinal needle placed in paramedian space, the correct levels were identified and marked out on the skin, and after timeout was conducted, the skin was infiltrated with local anesthetic. Skin incision was then made sharply and Bovie electrocautery was used to dissect the subcutaneous tissue until  the lumbodorsal fascia was identified. The fascia was then incised using Bovie electrocautery and the lamina at the L3, L4, and L5 levels were identified and dissection was carried out in the subperiosteal plane. Self-retaining retractor was then placed, and intraoperative x-ray was taken to confirm we were at the correct levels.  Using a high-speed drill, the inferior portion of the lamina was drilled away at L3, and L4, with the superior portion of L5 also removed.  Medial facetectomies were performed with high-speed drill and Kerrison rongeurs.  Hypertrophied ligamentum flavum was dissected from the epidural space and removed after detachment from the medial facets.bilateral foraminotomies were then performed at both of these levels using Kerrison rongeurs. The decompression was confirmed using a small ball tip dissector.  Hemostasis was then secured using a combination of morcellized Gelfoam and thrombin and bipolar electrocautery. The wound was irrigated thoroughly.  Marcaine was injected into the paraspinous muscles and soft tissues.  Self-retaining retractor was then removed, and the wound was closed in layers using a combination of interrupted 0 Vicryl and 2-0 Vicryl stitches followed by 4-0 monocryl in subcuticular manner and Dermabond.  A sterile dressing was placed.   At the end of the case all sponge, needle, and instrument counts were correct. The patient was then transferred to the stretcher and taken to the postanesthesia care unit in stable hemodynamic condition.

## 2021-04-22 NOTE — Anesthesia Postprocedure Evaluation (Signed)
Anesthesia Post Note  Patient: Dima Mini  Procedure(s) Performed: LAMINECTOMY AND FORAMINOTOMY Lumbar three-four, Lumbar four-five (Spine Lumbar)     Patient location during evaluation: PACU Anesthesia Type: General Level of consciousness: awake and alert Pain management: pain level controlled Vital Signs Assessment: post-procedure vital signs reviewed and stable Respiratory status: spontaneous breathing, nonlabored ventilation, respiratory function stable and patient connected to nasal cannula oxygen Cardiovascular status: blood pressure returned to baseline and stable Postop Assessment: no apparent nausea or vomiting Anesthetic complications: no   No notable events documented.  Last Vitals:  Vitals:   04/22/21 1133 04/22/21 1151  BP: 123/79 112/67  Pulse:    Resp: 14   Temp:    SpO2: 95% 94%    Last Pain:  Vitals:   04/22/21 1133  TempSrc:   PainSc: Asleep                 Effie Berkshire

## 2021-04-22 NOTE — Transfer of Care (Signed)
Immediate Anesthesia Transfer of Care Note  Patient: Meredith [name]  Procedure(s) Performed: LAMINECTOMY AND FORAMINOTOMY Lumbar three-four, Lumbar four-five (Spine Lumbar)  Patient Location: PACU  Anesthesia Type:General  Level of Consciousness: awake, alert , oriented and patient cooperative  Airway & Oxygen Therapy: Patient Spontanous Breathing and Patient connected to face mask oxygen  Post-op Assessment: Report given to RN, Post -op Vital signs reviewed and stable and Patient moving all extremities  Post vital signs: Reviewed and stable  Last Vitals:  Vitals Value Taken Time  BP 114/90 04/22/21 1034  Temp    Pulse 100 04/22/21 1039  Resp 24 04/22/21 1039  SpO2 100 % 04/22/21 1039  Vitals shown include unvalidated device data.  Last Pain:  Vitals:   04/22/21 0642  TempSrc:   PainSc: 8       Patients Stated Pain Goal: 4 (23/55/73 2202)  Complications: No notable events documented.

## 2021-04-22 NOTE — Anesthesia Preprocedure Evaluation (Addendum)
Anesthesia Evaluation  Patient identified by MRN, date of birth, ID band Patient awake    Reviewed: Allergy & Precautions, NPO status , Patient's Chart, lab work & pertinent test results  Airway Mallampati: I  TM Distance: >3 FB Neck ROM: Full    Dental  (+) Partial Upper, Partial Lower, Dental Advisory Given   Pulmonary asthma ,    Pulmonary exam normal        Cardiovascular hypertension, Pt. on medications and Pt. on home beta blockers  Rhythm:Regular Rate:Normal     Neuro/Psych  Headaches, PSYCHIATRIC DISORDERS Anxiety Depression    GI/Hepatic Neg liver ROS, GERD  Medicated,  Endo/Other  diabetesHypothyroidism   Renal/GU negative Renal ROS     Musculoskeletal  (+) Fibromyalgia -  Abdominal Normal abdominal exam  (+)   Peds  Hematology negative hematology ROS (+)   Anesthesia Other Findings   Reproductive/Obstetrics                            Anesthesia Physical Anesthesia Plan  ASA: 2  Anesthesia Plan: General   Post-op Pain Management:    Induction: Intravenous  PONV Risk Score and Plan: 4 or greater and Ondansetron, Dexamethasone, Midazolam and Scopolamine patch - Pre-op  Airway Management Planned: Oral ETT  Additional Equipment: None  Intra-op Plan:   Post-operative Plan: Extubation in OR  Informed Consent: I have reviewed the patients History and Physical, chart, labs and discussed the procedure including the risks, benefits and alternatives for the proposed anesthesia with the patient or authorized representative who has indicated his/her understanding and acceptance.     Dental advisory given  Plan Discussed with: CRNA  Anesthesia Plan Comments:        Anesthesia Quick Evaluation

## 2021-04-22 NOTE — Anesthesia Procedure Notes (Signed)
Procedure Name: Intubation Date/Time: 04/22/2021 8:12 AM Performed by: Myna Bright, CRNA Pre-anesthesia Checklist: Patient identified, Emergency Drugs available, Suction available and Patient being monitored Patient Re-evaluated:Patient Re-evaluated prior to induction Oxygen Delivery Method: Circle system utilized Preoxygenation: Pre-oxygenation with 100% oxygen Induction Type: IV induction Ventilation: Mask ventilation without difficulty Laryngoscope Size: Mac and 3 Grade View: Grade II Tube type: Oral Tube size: 7.0 mm Number of attempts: 1 Airway Equipment and Method: Stylet Placement Confirmation: ETT inserted through vocal cords under direct vision, positive ETCO2 and breath sounds checked- equal and bilateral Secured at: 21 cm Tube secured with: Tape Dental Injury: Teeth and Oropharynx as per pre-operative assessment

## 2021-04-23 ENCOUNTER — Encounter (HOSPITAL_COMMUNITY): Payer: Self-pay | Admitting: Neurosurgery

## 2021-05-26 ENCOUNTER — Other Ambulatory Visit: Payer: Self-pay

## 2021-05-26 ENCOUNTER — Ambulatory Visit (INDEPENDENT_AMBULATORY_CARE_PROVIDER_SITE_OTHER): Payer: Medicare Other | Admitting: Dermatology

## 2021-05-26 DIAGNOSIS — L659 Nonscarring hair loss, unspecified: Secondary | ICD-10-CM

## 2021-05-26 DIAGNOSIS — L409 Psoriasis, unspecified: Secondary | ICD-10-CM | POA: Diagnosis not present

## 2021-05-26 MED ORDER — VTAMA 1 % EX CREA: 1.0000 "application " | TOPICAL_CREAM | Freq: Every day | CUTANEOUS | 2 refills | Status: DC

## 2021-05-26 NOTE — Patient Instructions (Addendum)
Start Vtama once daily to affected areas of psoriasis.  Continue Protopic to affected areas twice daily. Discontinue Otezla.   Reviewed risks of biologics including immunosuppression, infections, injection site reaction, and failure to improve condition. Goal is control of skin condition, not cure.  Some older biologics such as Humira and Enbrel may slightly increase risk of malignancy and may worsen congestive heart failure. The use of biologics requires long term medication management, including periodic office visits and monitoring of blood work.  Recommend minoxidil 5% (Rogaine for men) solution or foam to be applied to the scalp and left in. This should ideally be used twice daily for best results but it helps with hair regrowth when used at least three times per week. Rogaine initially can cause increased hair shedding for the first few weeks but this will stop with continued use. In studies, people who used minoxidil (Rogaine) for at least 6 months had thicker hair than people who did not. Minoxidil topical (Rogaine) only works as long as it continues to be used. If if it is no longer used then the hair it has been helping to regrow can fall out. Minoxidil topical (Rogaine) can cause increased facial hair growth which can usually be managed easily with a battery-operated hair trimmer. If facial hair growth is bothersome, switching to the 2% women's version can decrease the risk of unwanted facial hair growth.  If you have any questions or concerns for your doctor, please call our main line at [phone] and press option 4 to reach your doctor's medical assistant. If no one answers, please leave a voicemail as directed and we will return your call as soon as possible. Messages left after 4 pm will be answered the following business day.   You may also send [place] a message via [place]. We typically respond to MyChart messages within 1-2 business days.  For prescription refills, please ask your  pharmacy to contact our office. Our fax number is [phone].  If you have an urgent issue when the clinic is closed that cannot wait until the next business day, you can page your doctor at the number below.    Please note that while we do our best to be available for urgent issues outside of office hours, we are not available 24/7.   If you have an urgent issue and are unable to reach [place], you may choose to seek medical care at your doctor's office, retail clinic, urgent care center, or emergency room.  If you have a medical emergency, please immediately call 911 or go to the emergency department.  Pager Numbers  - Dr. Nehemiah Massed: [phone]  - Dr. Laurence Ferrari: [phone]  - Dr. Nicole Kindred: [phone]  In the event of inclement weather, please call our main line at [phone] for an update on the status of any delays or closures.  Dermatology Medication Tips: Please keep the boxes that topical medications come in in order to help keep track of the instructions about where and how to use these. Pharmacies typically print the medication instructions only on the boxes and not directly on the medication tubes.   If your medication is too expensive, please contact our office at [phone] option 4 or send [place] a message through [place].   We are unable to tell what your co-pay for medications will be in advance as this is different depending on your insurance coverage. However, we may be able to find a substitute medication at lower cost or fill out paperwork to get insurance to  cover a needed medication.   If a prior authorization is required to get your medication covered by your insurance company, please allow [place] 1-2 business days to complete this process.  Drug prices often vary depending on where the prescription is filled and some pharmacies may offer cheaper prices.  The website www.goodrx.com contains coupons for medications through different pharmacies. The prices here do not  account for what the cost may be with help from insurance (it may be cheaper with your insurance), but the website can give you the price if you did not use any insurance.  - You can print the associated coupon and take it with your prescription to the pharmacy.  - You may also stop by our office during regular business hours and pick up a GoodRx coupon card.  - If you need your prescription sent electronically to a different pharmacy, notify our office through [facility] or by phone at [phone] option 4.

## 2021-05-26 NOTE — Progress Notes (Signed)
Follow-Up Visit   Subjective  Meredith [name] is a 55 y.o. female who presents for the following: Psoriasis (Patient here today for 5 month psoriasis and dermatitis follow up. Patient on Otezla '30mg'$  twice daily and is using protopic and elidel on lower legs but still having itching. Patient also using protopic at face. ).  Patient tolerating [place] Nail well. She has been taking it for 1 year.  The following portions of the chart were reviewed this encounter and updated as appropriate:   Tobacco  Allergies  Meds  Problems  Med Hx  Surg Hx  Fam Hx      Review of Systems:  No other skin or systemic complaints except as noted in HPI or Assessment and Plan.  Objective  Well appearing patient in no apparent distress; mood and affect are within normal limits.  A focused examination was performed including legs, face, scalp. Relevant physical exam findings are noted in the Assessment and Plan.  Right Lower Leg - Anterior Scaly pink plaques at legs, face  Scalp Diffuse thinning of the crown and widening of the midline part with retention of the frontal hairline - Reviewed progressive nature and prognosis.   Negative hair pull test   Assessment & Plan  Psoriasis Right Lower Leg - Anterior  Psoriasis is a chronic non-curable, but treatable genetic/hereditary disease that may have other systemic features affecting other organ systems such as joints (Psoriatic Arthritis). It is associated with an increased risk of inflammatory bowel disease, heart disease, non-alcoholic fatty liver disease, and depression.    Patient has fibromyalgia and sees Dr. Posey Pronto at [facility]. No concern for psoriatic arthritis.  Discussed treatment with topicals vs NVUVB vs biologics Orson Ape). Patient not able to do light therapy due to inability to travel to clinic twice a week.  Reviewed risks of biologics including immunosuppression, infections, injection site reaction, and failure to improve  condition. Goal is control of skin condition, not cure.  Some older biologics such as Humira and Enbrel may slightly increase risk of malignancy and may worsen congestive heart failure. The use of biologics requires long term medication management, including periodic office visits and monitoring of blood work.  Start Vtama once daily to affected areas of psoriasis.  Samples x 2 given to patient. Lot # [id number]  Exp: Oct 2023 Continue protopic twice daily. Will submit for [facility] pending labs.   Tapinarof (VTAMA) 1 % CREA - Right Lower Leg - Anterior Apply 1 application topically daily. To areas of psoriasis  Related Procedures Hepatitis B surface antigen Hepatitis B surface antibody,qualitative Hepatitis C antibody HIV Antibody (routine testing w rflx) Hepatitis B core antibody, total QuantiFERON-TB Gold Plus  Related Medications pimecrolimus (ELIDEL) 1 % cream Apply topically 2 (two) times daily. Apply to affected areas of psoriasis on face and body prn flares  Alopecia Scalp  C/w androgenetic alopecia  Recommend minoxidil 5% (Rogaine for men) solution or foam to be applied to the scalp and left in. This should ideally be used twice daily for best results but it helps with hair regrowth when used at least three times per week. Rogaine initially can cause increased hair shedding for the first few weeks but this will stop with continued use. In studies, people who used minoxidil (Rogaine) for at least 6 months had thicker hair than people who did not. Minoxidil topical (Rogaine) only works as long as it continues to be used. If if it is no longer used then the hair it has been helping to  regrow can fall out. Minoxidil topical (Rogaine) can cause increased facial hair growth which can usually be managed easily with a battery-operated hair trimmer. If facial hair growth is bothersome, switching to the 2% women's version can decrease the risk of unwanted facial hair growth.   Related  Procedures TSH Rfx on Abnormal to Free T4 Vitamin D, 25-hydroxy Ferritin   Other Procedures Placed This Encounter VITAMIN D 25 Hydroxy (Vit-D Deficiency, Fractures) T4F Ferritin  Return for 2-3 month follow up.  Graciella Belton, RMA, am acting as scribe for Forest Gleason, MD .  Documentation: I have reviewed the above documentation for accuracy and completeness, and I agree with the above.  Forest Gleason, MD

## 2021-05-27 ENCOUNTER — Telehealth: Payer: Self-pay

## 2021-05-27 LAB — HIV ANTIBODY (ROUTINE TESTING W REFLEX): HIV Screen 4th Generation wRfx: NONREACTIVE

## 2021-05-27 LAB — FERRITIN: Ferritin: 114 ng/mL (ref 15–150)

## 2021-05-27 LAB — T4F: T4,Free (Direct): 1.96 ng/dL — ABNORMAL HIGH (ref 0.82–1.77)

## 2021-05-27 LAB — HEPATITIS B SURFACE ANTIBODY,QUALITATIVE: Hep B Surface Ab, Qual: REACTIVE

## 2021-05-27 LAB — VITAMIN D 25 HYDROXY (VIT D DEFICIENCY, FRACTURES): Vit D, 25-Hydroxy: 35.4 ng/mL (ref 30.0–100.0)

## 2021-05-27 LAB — HEPATITIS B CORE ANTIBODY, TOTAL: Hep B Core Total Ab: NEGATIVE

## 2021-05-27 LAB — HEPATITIS B SURFACE ANTIGEN: Hepatitis B Surface Ag: NEGATIVE

## 2021-05-27 LAB — TSH RFX ON ABNORMAL TO FREE T4: TSH: 0.068 u[IU]/mL — ABNORMAL LOW (ref 0.450–4.500)

## 2021-05-27 LAB — HEPATITIS C ANTIBODY: Hep C Virus Ab: <0.1 s/co ratio (ref 0.0–0.9)

## 2021-05-27 NOTE — Telephone Encounter (Signed)
-----   Message from [place], MD sent at 05/27/2021  9:30 AM EDT ----- TSH low and T4 high suggestive of hyperthyroidism (overactive thyroid disease) -- this could be contributing to hair loss. Recommend follow-up with PCP.  Vitamin D in normal range but low for hair regrowth. Recommend taking vitamin D 1000 IU daily for the next 3 months to boost vitamin D stores to support hair regrowth.  Ferritin (iron stores) normal and no treatment needed  No evidence of hepatitis, HIV or tuberculosis. Still awaiting Quantiferon gold results. Once we confirm that is normal, will submit for [facility] approval  MAs please call. Thank you!

## 2021-05-27 NOTE — Telephone Encounter (Signed)
Patient advised lab results showed TSH low and T4 high suggestive of hyperthyroidism (overactive thyroid disease) -- this could be contributing to hair loss. Recommend follow-up with PCP. Patient has appt with Dr. Caryl Comes in 2 weeks for physical.   Vitamin D in normal range but low for hair regrowth. Recommend taking vitamin D 1000 IU daily for the next 3 months to boost vitamin D stores to support hair regrowth.   Ferritin (iron stores) normal and no treatment needed   No evidence of hepatitis, HIV or tuberculosis. Still awaiting Quantiferon gold results. Once we confirm that is normal, will submit for [facility] approval./js

## 2021-05-28 ENCOUNTER — Encounter: Payer: Self-pay | Admitting: Dermatology

## 2021-05-31 NOTE — Telephone Encounter (Signed)
Meredith [name], Which pharmacy are we supposed to send Vtama to for best pricing? Thank you!

## 2021-06-01 ENCOUNTER — Encounter: Payer: Self-pay | Admitting: Dermatology

## 2021-06-01 ENCOUNTER — Other Ambulatory Visit: Payer: Self-pay

## 2021-06-01 ENCOUNTER — Telehealth: Payer: Self-pay

## 2021-06-01 DIAGNOSIS — L409 Psoriasis, unspecified: Secondary | ICD-10-CM

## 2021-06-01 MED ORDER — BETAMETHASONE DIPROPIONATE 0.05 % EX OINT: TOPICAL_OINTMENT | CUTANEOUS | 1 refills | Status: DC

## 2021-06-01 NOTE — Telephone Encounter (Signed)
LVM for patient we were sending in alternate medication since Vtama is not covered and not affordable. Augmented betamethasone ointment sent in for patient to use BID to AA's legs x 2 weeks then BID weekends only. Avoid applying to face, groin, and axilla. Use as directed. Risk of skin atrophy with long-term use reviewed. RX sent to [facility] in [place].Cherly Hensen

## 2021-06-01 NOTE — Telephone Encounter (Signed)
Please advise patient Meredith [name] not available at a good price with her insurance coverage. Recommend starting augmented betamethasone ointment apply twice daily as needed to affected areas for two weeks then apply twice daily on weekends only to affected areas at the legs. Thank you!

## 2021-06-03 ENCOUNTER — Telehealth: Payer: Self-pay

## 2021-06-03 ENCOUNTER — Encounter: Payer: Self-pay | Admitting: Dermatology

## 2021-06-03 LAB — QUANTIFERON-TB GOLD PLUS
QuantiFERON Mitogen Value: 3.58 IU/mL
QuantiFERON Nil Value: 0.08 IU/mL
QuantiFERON TB1 Ag Value: 0.56 IU/mL
QuantiFERON TB2 Ag Value: 0.49 IU/mL
QuantiFERON-TB Gold Plus: POSITIVE — AB

## 2021-06-03 NOTE — Telephone Encounter (Signed)
-----   Message from Alfonso Patten, MD sent at 06/03/2021  2:36 PM EDT ----- Called patient to discuss positive quantiferon gold test result. Would like to ask about any international travel or known exposures to people who a history of tuberculosis (lung infection causing coughing, fever, night sweats). Would like to also ask about any history of fever, night sweats, persistent cough.   No other significant risk factors identified on chart review.   If no known risk factors and no symptoms, recommend repeating quantiferon gold test (occasionally the test will show positive incorrectly).   If it comes back positive again, would have patient evaluated and likely treated for latent tuberculosis (carrying some of the tuberculosis bacteria but they are "sleeping" and not active) to kill off any bacteria so it could not turn into an active infection.  No answer, left voicemail to call for results.  MAs please try again 06/03/2021 before close but do not need to leave another voicemail if she does not pick up. Thank you!

## 2021-06-09 ENCOUNTER — Telehealth: Payer: Self-pay

## 2021-06-09 NOTE — Telephone Encounter (Signed)
Thank you! I agree with follow-up with Dr. Caryl Comes for possible tuberculosis. Would recommend continuing Otezla and topicals for psoriasis for right now until we have all the information regarding the repeat labs and follow-up. Thank you!

## 2021-06-09 NOTE — Telephone Encounter (Signed)
Spoke with patient and partner, [name] regarding labs today. She did some international travel for two months and came home June 10th. She does have a cough at night but PCP has related that back to post nasal drip. No other symptoms were present that were listed.  They have requested that labs be faxed over to PCP at [facility] and labs re drawn there as going to [facility] was very difficult. Information has been faxed to Dr. Caryl Comes.

## 2021-06-09 NOTE — Telephone Encounter (Signed)
-----   Message from Alfonso Patten, MD sent at 06/03/2021  2:36 PM EDT ----- Called patient to discuss positive quantiferon gold test result. Would like to ask about any international travel or known exposures to people who a history of tuberculosis (lung infection causing coughing, fever, night sweats). Would like to also ask about any history of fever, night sweats, persistent cough.   No other significant risk factors identified on chart review.   If no known risk factors and no symptoms, recommend repeating quantiferon gold test (occasionally the test will show positive incorrectly).   If it comes back positive again, would have patient evaluated and likely treated for latent tuberculosis (carrying some of the tuberculosis bacteria but they are "sleeping" and not active) to kill off any bacteria so it could not turn into an active infection.  No answer, left voicemail to call for results.  MAs please try again 06/03/2021 before close but do not need to leave another voicemail if she does not pick up. Thank you!

## 2021-07-06 NOTE — Progress Notes (Signed)
Meredith [name] [facility] [address] [place] Phone: [phone] Subjective:   Meredith [name], am serving as a scribe for Dr. Hulan Saas. This visit occurred during the SARS-CoV-2 public health emergency.  Safety protocols were in place, including screening questions prior to the visit, additional usage of staff PPE, and extensive cleaning of exam room while observing appropriate contact time as indicated for disinfecting solutions.   I'm seeing this patient by the request  of:  Adin Hector, MD  CC: Low back pain follow-up  [id number]  Meredith [name] is a 55 y.o. female coming in with complaint of back and neck pain. OMT on 04/05/2021.   Patient states she's been doing okay since surgery. She has trouble sleeping, but only a little to do with pain. She still in pain.  Patient does feel like she is making progress but still is frustrated because it is slower than she thinks.  Medications patient has been prescribed: None        dical record.  No pertanent information unless stated regarding to the chief complaint.   Past Medical History:  Diagnosis Date   Anemia    Anxiety    Asthma    Chronic insomnia    Depression    Dermatitis    Gastroparesis    GERD (gastroesophageal reflux disease)    Hypertension    Hypothyroidism    Pre-diabetes    Rhinitis     Allergies  Allergen Reactions   Latex Rash   Shellfish Allergy Rash    Not all the time    Amoxicillin-Pot Clavulanate     unknown     Review of Systems:  No headache, visual changes, nausea, vomiting, diarrhea, constipation, dizziness, abdominal pain, skin rash, fevers, chills, night sweats, weight loss, swollen lymph nodes, b joint swelling, chest pain, shortness of breath, mood changes. POSITIVE muscle aches, body aches  Objective  Blood pressure 124/76, pulse (!) 102, height 5' (1.524 m), weight 135 lb (61.2 kg), SpO2 98 %.   General: No apparent distress alert  and oriented x3 mood and affect normal, dressed appropriately.  HEENT: Pupils equal, extraocular movements intact  Respiratory: Patient's speak in full sentences and does not appear short of breath  Cardiovascular: No lower extremity edema, non tender, no erythema  Patient is cautiously walking Low back exam shows the incision is well-healed.  Patient does have very mild warmness to touch in the area.  Patient still has some limited range of motion.  Does have some voluntary guarding as well.  Neurovascularly intact distally.  Osteopathic findings  C3 flexed rotated and side bent left T5 extended rotated and side bent right inhaled rib L2 flexed rotated and side bent right Sacrum right on right       Assessment and Plan: Spinal stenosis, lumbar region, with neurogenic claudication Severe spinal stenosis with patient having surgical intervention.  Patient is making progress.  Avoided HVLA but did do muscle energy.  Patient did improve.  Patient also does not have as much foot drop as previously.  Patient is making some progress and encouraged her to continue to do the exercises regularly.  Patient does have medications including the Cymbalta, Flexeril, and gabapentin.  Patient will follow up with me again in 6 weeks    Nonallopathic problems  Decision today to treat with OMT was based on Physical Exam  After verbal consent patient was treated with  ME, FPR techniques in cervical, rib, thoracic, lumbar,  and sacral  areas avoided HVLA with recent surgery.  Patient tolerated the procedure well with improvement in symptoms  Patient given exercises, stretches and lifestyle modifications  See medications in patient instructions if given  Patient will follow up in 4-8 weeks      The above documentation has been reviewed and is accurate and complete Lyndal Pulley, DO        Note: This dictation was prepared with Dragon dictation along with smaller phrase technology. Any  transcriptional errors that result from this process are unintentional.

## 2021-07-07 ENCOUNTER — Ambulatory Visit (INDEPENDENT_AMBULATORY_CARE_PROVIDER_SITE_OTHER): Payer: Medicare Other | Admitting: Family Medicine

## 2021-07-07 ENCOUNTER — Other Ambulatory Visit: Payer: Self-pay

## 2021-07-07 VITALS — BP 124/76 | HR 102 | Ht 60.0 in | Wt 135.0 lb

## 2021-07-07 DIAGNOSIS — M9903 Segmental and somatic dysfunction of lumbar region: Secondary | ICD-10-CM

## 2021-07-07 DIAGNOSIS — M9904 Segmental and somatic dysfunction of sacral region: Secondary | ICD-10-CM | POA: Diagnosis not present

## 2021-07-07 DIAGNOSIS — M48062 Spinal stenosis, lumbar region with neurogenic claudication: Secondary | ICD-10-CM | POA: Diagnosis not present

## 2021-07-07 DIAGNOSIS — M9902 Segmental and somatic dysfunction of thoracic region: Secondary | ICD-10-CM | POA: Diagnosis not present

## 2021-07-07 DIAGNOSIS — M9901 Segmental and somatic dysfunction of cervical region: Secondary | ICD-10-CM

## 2021-07-07 DIAGNOSIS — M9908 Segmental and somatic dysfunction of rib cage: Secondary | ICD-10-CM | POA: Diagnosis not present

## 2021-07-07 NOTE — Patient Instructions (Addendum)
Voltaren or Arnica to back Heat 10-20 mins then ice 20 mins every 6 hours See you again in 4-5 weeks

## 2021-07-08 NOTE — Assessment & Plan Note (Signed)
Severe spinal stenosis with patient having surgical intervention.  Patient is making progress.  Avoided HVLA but did do muscle energy.  Patient did improve.  Patient also does not have as much foot drop as previously.  Patient is making some progress and encouraged her to continue to do the exercises regularly.  Patient does have medications including the Cymbalta, Flexeril, and gabapentin.  Patient will follow up with me again in 6 weeks

## 2021-08-09 ENCOUNTER — Other Ambulatory Visit: Payer: Self-pay | Admitting: Internal Medicine

## 2021-08-09 DIAGNOSIS — Z1231 Encounter for screening mammogram for malignant neoplasm of breast: Secondary | ICD-10-CM

## 2021-08-10 NOTE — Progress Notes (Signed)
[place] Phone: [phone] Subjective:    I'm seeing this patient by the request  of:  Adin Hector, MD  CC: back and neck pain   [id number]  Meredith [name] is a 55 y.o. female coming in with complaint of back and neck pain. OMT on 07/07/2021. Patient states doing well. Pain remains the same. Wants to know about shoe recommendation...hoka patient otherwise continues to have low back pain.  Did have surgical intervention but is continuing to have difficulty.  Patient is scheduled to have another MRI before she goes to [place] for the next 5 months.  Medications patient has been prescribed: None        Reviewed prior external information including notes and imaging from previsou exam, outside providers and external EMR if available.   As well as notes that were available from care everywhere and other healthcare systems.  Past medical history, social, surgical and family history all reviewed in electronic medical record.  No pertanent information unless stated regarding to the chief complaint.   Past Medical History:  Diagnosis Date   Anemia    Anxiety    Asthma    Chronic insomnia    Depression    Dermatitis    Gastroparesis    GERD (gastroesophageal reflux disease)    Hypertension    Hypothyroidism    Pre-diabetes    Rhinitis     Allergies  Allergen Reactions   Latex Rash   Shellfish Allergy Rash    Not all the time    Amoxicillin-Pot Clavulanate     unknown     Review of Systems:  No  visual changes, nausea, vomiting, diarrhea, constipation, dizziness, abdominal pain, skin rash, fevers, chills, night sweats, weight loss, swollen lymph nodes,  joint swelling, chest pain, shortness of breath, mood changes. POSITIVE muscle aches, muscle aches, headache  Objective  Blood pressure 130/74, pulse 97, weight 138 lb (62.6 kg), SpO2 97 %.   General: No apparent distress alert and oriented  x3 mood and affect normal, dressed appropriately.  HEENT: Pupils equal, extraocular movements intact  Respiratory: Patient's speak in full sentences and does not appear short of breath  Cardiovascular: No lower extremity edema, non tender, no erythema  Patient low back exam is diffusely tender to palpation.  Loss of lordosis.  Tightness with straight leg test.  Has only 5 degrees of extension of the back noted. Patient does have tightness with FABER test.  Have increased discomfort in the parascapular region as well right greater than left.   Osteopathic findings  C2 flexed rotated and side bent right C7 flexed rotated and side bent left T3 extended rotated and side bent right inhaled rib T5 extended rotated and side bent left inhaled rib L2 flexed rotated and side bent right Sacrum right on right       Assessment and Plan:  Fibromyalgia Patient does have fibromyalgia.  Patient has had some more chronic deconditioning recently.  Discussed icing regimen and home exercises.  Increase activity slowly.  Patient does have known spinal stenosis that is severe.  We will continue to monitor.  Worsening pain consider the possibility of other medication changes but hopefully not necessary.  Follow-up again in 4 to 8 weeks  Spinal stenosis, lumbar region, with neurogenic claudication Patient has been given tramadol previously.  We will keep this very rare.  Patient scheduled for another MRI from another provider.  We will continue to monitor  otherwise.  Follow-up with me again in 6   Nonallopathic problems  Decision today to treat with OMT was based on Physical Exam  After verbal consent patient was treated with  ME, FPR techniques in cervical, rib, thoracic, lumbar, and sacral  areas avoided any HVLA secondary to patient's discomfort and pain at the moment.  We will do believe the patient is also having a potential exacerbation of patient's fibromyalgia.  No significant change in medications  at the moment.  We did discuss continuing the gabapentin and the Cymbalta.  Patient tolerated the procedure well with improvement in symptoms  Patient given exercises, stretches and lifestyle modifications  See medications in patient instructions if given  Patient will follow up in 4-8 weeks      The above documentation has been reviewed and is accurate and complete Lyndal Pulley, DO       Note: This dictation was prepared with Dragon dictation along with smaller phrase technology. Any transcriptional errors that result from this process are unintentional.

## 2021-08-11 ENCOUNTER — Other Ambulatory Visit: Payer: Self-pay

## 2021-08-11 ENCOUNTER — Ambulatory Visit (INDEPENDENT_AMBULATORY_CARE_PROVIDER_SITE_OTHER): Payer: Medicare Other | Admitting: Family Medicine

## 2021-08-11 ENCOUNTER — Other Ambulatory Visit: Payer: Self-pay | Admitting: Family Medicine

## 2021-08-11 ENCOUNTER — Ambulatory Visit
Admission: RE | Admit: 2021-08-11 | Discharge: 2021-08-11 | Disposition: A | Discharge status: Home / Self Care | Payer: Medicare Other | Attending: Family Medicine | Admitting: Family Medicine

## 2021-08-11 ENCOUNTER — Ambulatory Visit (LOCAL_COMMUNITY_HEALTH_CENTER): Payer: Medicare Other

## 2021-08-11 ENCOUNTER — Ambulatory Visit
Admission: RE | Admit: 2021-08-11 | Discharge: 2021-08-11 | Disposition: A | Discharge status: Home / Self Care | Payer: Medicare Other | Source: Ambulatory Visit | Attending: Family Medicine | Admitting: Family Medicine

## 2021-08-11 VITALS — Ht 60.0 in | Wt 138.0 lb

## 2021-08-11 VITALS — BP 130/74 | HR 97 | Wt 138.0 lb

## 2021-08-11 DIAGNOSIS — M9904 Segmental and somatic dysfunction of sacral region: Secondary | ICD-10-CM

## 2021-08-11 DIAGNOSIS — Z111 Encounter for screening for respiratory tuberculosis: Secondary | ICD-10-CM

## 2021-08-11 DIAGNOSIS — M9901 Segmental and somatic dysfunction of cervical region: Secondary | ICD-10-CM | POA: Diagnosis not present

## 2021-08-11 DIAGNOSIS — R7612 Nonspecific reaction to cell mediated immunity measurement of gamma interferon antigen response without active tuberculosis: Secondary | ICD-10-CM

## 2021-08-11 DIAGNOSIS — M9903 Segmental and somatic dysfunction of lumbar region: Secondary | ICD-10-CM | POA: Diagnosis not present

## 2021-08-11 DIAGNOSIS — M48062 Spinal stenosis, lumbar region with neurogenic claudication: Secondary | ICD-10-CM

## 2021-08-11 DIAGNOSIS — M9902 Segmental and somatic dysfunction of thoracic region: Secondary | ICD-10-CM | POA: Diagnosis not present

## 2021-08-11 DIAGNOSIS — M797 Fibromyalgia: Secondary | ICD-10-CM

## 2021-08-11 DIAGNOSIS — M9908 Segmental and somatic dysfunction of rib cage: Secondary | ICD-10-CM | POA: Diagnosis not present

## 2021-08-11 LAB — HM HIV SCREENING LAB: HM HIV Screening: NEGATIVE

## 2021-08-11 NOTE — Patient Instructions (Signed)
Good to see you! See you again in 3-4 weeks

## 2021-08-11 NOTE — Assessment & Plan Note (Signed)
Patient does have fibromyalgia.  Patient has had some more chronic deconditioning recently.  Discussed icing regimen and home exercises.  Increase activity slowly.  Patient does have known spinal stenosis that is severe.  We will continue to monitor.  Worsening pain consider the possibility of other medication changes but hopefully not necessary.  Follow-up again in 4 to 8 weeks

## 2021-08-11 NOTE — Progress Notes (Signed)
Client with positive QFT 05/2021 as part of evaluation for treatment of psoriasis. HIV, RPR, Hepatic Function Panel and CBC, diff and platelets drawn today. Counseled regarding significance of + QFT test results, recommendation for CXR (given referral), differences between LTBI and active disease (given comparison handout) and recommendation for treatment pending CXR results and Medical Director review of chart.  Accompanied by partner during visit today. 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 States now interested in treatment. 09/12/2021, client and partner leaving for 4 month stay in the [place]. On return to [place], they plan to spend 4 months in [place]. Client counseled to discuss with M. Dorminy RN, TB Coordinator plan for TLTBI in light of above. Ms. Dorminy's card given to client. Rich Number, RN

## 2021-08-11 NOTE — Assessment & Plan Note (Signed)
Patient has been given tramadol previously.  We will keep this very rare.  Patient scheduled for another MRI from another provider.  We will continue to monitor otherwise.  Follow-up with me again in 6

## 2021-08-12 LAB — HEPATIC FUNCTION PANEL
ALT: 51 IU/L — ABNORMAL HIGH (ref 0–32)
AST: 47 IU/L — ABNORMAL HIGH (ref 0–40)
Albumin: 5.1 g/dL — ABNORMAL HIGH (ref 3.8–4.9)
Alkaline Phosphatase: 112 IU/L (ref 44–121)
Bilirubin Total: 0.4 mg/dL (ref 0.0–1.2)
Bilirubin, Direct: 0.16 mg/dL (ref 0.00–0.40)
Total Protein: 7.6 g/dL (ref 6.0–8.5)

## 2021-08-12 LAB — CBC WITH DIFFERENTIAL/PLATELET
Basophils Absolute: 0.1 x10E3/uL (ref 0.0–0.2)
Basos: 1 %
EOS (ABSOLUTE): 0.4 x10E3/uL (ref 0.0–0.4)
Eos: 5 %
Hematocrit: 41.2 % (ref 34.0–46.6)
Hemoglobin: 13.7 g/dL (ref 11.1–15.9)
Immature Grans (Abs): 0 x10E3/uL (ref 0.0–0.1)
Immature Granulocytes: 0 %
Lymphocytes Absolute: 2.9 x10E3/uL (ref 0.7–3.1)
Lymphs: 31 %
MCH: 27 pg (ref 26.6–33.0)
MCHC: 33.3 g/dL (ref 31.5–35.7)
MCV: 81 fL (ref 79–97)
Monocytes Absolute: 0.8 x10E3/uL (ref 0.1–0.9)
Monocytes: 9 %
Neutrophils Absolute: 5 x10E3/uL (ref 1.4–7.0)
Neutrophils: 54 %
Platelets: 352 x10E3/uL (ref 150–450)
RBC: 5.08 x10E6/uL (ref 3.77–5.28)
RDW: 13.1 % (ref 11.7–15.4)
WBC: 9.2 x10E3/uL (ref 3.4–10.8)

## 2021-08-13 NOTE — Progress Notes (Signed)
Stable LFTs from 1 year prior. Recommend LTBI treatment.

## 2021-08-16 ENCOUNTER — Other Ambulatory Visit (HOSPITAL_COMMUNITY): Payer: Self-pay | Admitting: Neurosurgery

## 2021-08-16 ENCOUNTER — Other Ambulatory Visit: Payer: Self-pay | Admitting: Neurosurgery

## 2021-08-16 DIAGNOSIS — M48062 Spinal stenosis, lumbar region with neurogenic claudication: Secondary | ICD-10-CM

## 2021-08-26 ENCOUNTER — Other Ambulatory Visit: Payer: Self-pay

## 2021-08-26 ENCOUNTER — Ambulatory Visit (INDEPENDENT_AMBULATORY_CARE_PROVIDER_SITE_OTHER): Payer: Medicare Other | Admitting: Dermatology

## 2021-08-26 DIAGNOSIS — L409 Psoriasis, unspecified: Secondary | ICD-10-CM

## 2021-08-26 DIAGNOSIS — L308 Other specified dermatitis: Secondary | ICD-10-CM | POA: Diagnosis not present

## 2021-08-26 DIAGNOSIS — Z79899 Other long term (current) drug therapy: Secondary | ICD-10-CM

## 2021-08-26 DIAGNOSIS — L309 Dermatitis, unspecified: Secondary | ICD-10-CM

## 2021-08-26 MED ORDER — TACROLIMUS 0.1 % EX OINT: TOPICAL_OINTMENT | Freq: Two times a day (BID) | CUTANEOUS | 0 refills | Status: DC

## 2021-08-26 MED ORDER — OTEZLA 30 MG PO TABS: 30.0000 mg | ORAL_TABLET | Freq: Two times a day (BID) | ORAL | 1 refills | Status: DC

## 2021-08-26 MED ORDER — PREDNISONE 10 MG (21) PO TBPK: ORAL_TABLET | ORAL | 0 refills | Status: DC

## 2021-08-26 NOTE — Patient Instructions (Addendum)
For rash - Start Opzelura twice daily and if not improving in a few days, start prednisone 6 day dose pack  Start tacrolimus twice daily as needed.   Risks of prednisone taper discussed including mood irritability, insomnia, weight gain, stomach ulcers, increased risk of infection, increased blood sugar (diabetes), hypertension, osteoporosis with long-term or frequent use, and rare risk of avascular necrosis of the hip.   For psoriasis - Psoriasis is a chronic non-curable, but treatable genetic/hereditary disease that may have other systemic features affecting other organ systems such as joints (Psoriatic Arthritis). It is associated with an increased risk of inflammatory bowel disease, heart disease, non-alcoholic fatty liver disease, and depression.    Continue Otezla 30 mg twice daily Once patient returns from [employer], will resubmit for [employer].  Continue tacrolimus twice daily. Continue betamethasone ointment twice daily on weekends only to stubborn areas at the body. Avoid applying to face, groin, and axilla. Use as directed. Long-term use can cause thinning of the skin.  Topical steroids (such as triamcinolone, fluocinolone, fluocinonide, mometasone, clobetasol, halobetasol, betamethasone, hydrocortisone) can cause thinning and lightening of the skin if they are used for too long in the same area. Your physician has selected the right strength medicine for your problem and area affected on the body. Please use your medication only as directed by your physician to prevent side effects.   Side effects of Otezla (apremilast) include diarrhea, nausea, headache, upper respiratory infection, depression, and weight decrease (5-10%). It should only be taken by pregnant women after a discussion regarding risks and benefits with their doctor. Goal is control of skin condition, not cure.  The use of [place] Nail requires long term medication management, including periodic office visits.  If you have any  questions or concerns for your doctor, please call our main line at [phone] and press option 4 to reach your doctor's medical assistant. If no one answers, please leave a voicemail as directed and we will return your call as soon as possible. Messages left after 4 pm will be answered the following business day.   You may also send [place] a message via [place]. We typically respond to MyChart messages within 1-2 business days.  For prescription refills, please ask your pharmacy to contact our office. Our fax number is [phone].  If you have an urgent issue when the clinic is closed that cannot wait until the next business day, you can page your doctor at the number below.    Please note that while we do our best to be available for urgent issues outside of office hours, we are not available 24/7.   If you have an urgent issue and are unable to reach [place], you may choose to seek medical care at your doctor's office, retail clinic, urgent care center, or emergency room.  If you have a medical emergency, please immediately call 911 or go to the emergency department.  Pager Numbers  - Dr. Nehemiah Massed: [phone]  - Dr. Laurence Ferrari: [phone]  - Dr. Nicole Kindred: [phone]  In the event of inclement weather, please call our main line at [phone] for an update on the status of any delays or closures.  Dermatology Medication Tips: Please keep the boxes that topical medications come in in order to help keep track of the instructions about where and how to use these. Pharmacies typically print the medication instructions only on the boxes and not directly on the medication tubes.   If your medication is too expensive, please contact our office at [phone] option  4 or send [place] a message through [place].   We are unable to tell what your co-pay for medications will be in advance as this is different depending on your insurance coverage. However, we may be able to find a substitute medication at  lower cost or fill out paperwork to get insurance to cover a needed medication.   If a prior authorization is required to get your medication covered by your insurance company, please allow [place] 1-2 business days to complete this process.  Drug prices often vary depending on where the prescription is filled and some pharmacies may offer cheaper prices.  The website www.goodrx.com contains coupons for medications through different pharmacies. The prices here do not account for what the cost may be with help from insurance (it may be cheaper with your insurance), but the website can give you the price if you did not use any insurance.  - You can print the associated coupon and take it with your prescription to the pharmacy.  - You may also stop by our office during regular business hours and pick up a GoodRx coupon card.  - If you need your prescription sent electronically to a different pharmacy, notify our office through [facility] or by phone at [phone] option 4.

## 2021-08-26 NOTE — Progress Notes (Signed)
Follow-Up Visit   Subjective  Meredith [name] is a 55 y.o. female who presents for the following: Follow-up (Patient here today for psoriasis follow up. She is currently taking Otezla 30 mg twice daily, using betamethasone and tacrolimus. ) and Rash (Patient has developed a rash at neck and face about 1 week ago. ).  Patient accompanied by boyfriend.   Patient going to the [place] for 4 months.   After positive quantiferon gold, advised we had to confirm whether she could have latent TB before starting [employer]. They went to health department for follow-up, and they report her chest xray was normal, she does not have latent TB and health department did not recommend any treatment.   The following portions of the chart were reviewed this encounter and updated as appropriate:   Tobacco  Allergies  Meds  Problems  Med Hx  Surg Hx  Fam Hx      Review of Systems:  No other skin or systemic complaints except as noted in HPI or Assessment and Plan.  Objective  Well appearing patient in no apparent distress; mood and affect are within normal limits.  A focused examination was performed including legs, back, neck, face. Relevant physical exam findings are noted in the Assessment and Plan.  right lower leg Thick scaly pink plaques right lower leg  neck Scaly pink papules coalescing to plaques    Assessment & Plan  Psoriasis right lower leg  Chronic condition with duration over one year. Condition is bothersome to patient. Not currently at goal.  Psoriasis is a chronic non-curable, but treatable genetic/hereditary disease that may have other systemic features affecting other organ systems such as joints (Psoriatic Arthritis). It is associated with an increased risk of inflammatory bowel disease, heart disease, non-alcoholic fatty liver disease, and depression.    Continue Otezla 30 mg twice daily Once patient returns from [place], can resubmit for [employer] pending  confirmation from health department that they have no concern for latent TB (patient and boyfriend said her chext xray was normal and they had no concerns about TB and did not recommend treatment; we are requesting records).   Continue tacrolimus twice daily. Continue betamethasone ointment twice daily on weekends only to stubborn areas at the body. Avoid applying to face, groin, and axilla. Use as directed. Long-term use can cause thinning of the skin.  Topical steroids (such as triamcinolone, fluocinolone, fluocinonide, mometasone, clobetasol, halobetasol, betamethasone, hydrocortisone) can cause thinning and lightening of the skin if they are used for too long in the same area. Your physician has selected the right strength medicine for your problem and area affected on the body. Please use your medication only as directed by your physician to prevent side effects.   Side effects of Otezla (apremilast) include diarrhea, nausea, headache, upper respiratory infection, depression, and weight decrease (5-10%). It should only be taken by pregnant women after a discussion regarding risks and benefits with their doctor. Goal is control of skin condition, not cure.  The use of [place] Nail requires long term medication management, including periodic office visits.   tacrolimus (PROTOPIC) 0.1 % ointment - right lower leg Apply topically 2 (two) times daily.  Apremilast (OTEZLA) 30 MG TABS - right lower leg Take 1 tablet (30 mg total) by mouth 2 (two) times daily.  Related Medications betamethasone dipropionate (DIPROLENE) 0.05 % ointment Apply twice daily to affected areas at legs x 2 weeks then twice daily weekends only. Avoid applying to face, groin, and axilla. Use as directed.  Risk of skin atrophy with long-term use reviewed.  Other eczema neck  Start Opzelura twice daily and if not improving in a few days, start prednisone 6 day dose pack   Start tacrolimus twice daily as needed.   Risks of  prednisone taper discussed including mood irritability, insomnia, weight gain, stomach ulcers, increased risk of infection, increased blood sugar (diabetes), hypertension, osteoporosis with long-term or frequent use, and rare risk of avascular necrosis of the hip.    Eczema, unspecified type  Related Medications PROTOPIC 0.1 % ointment Apply to affected areas on Nose, fingertips and to B/L lower legs  Return in about 5 months (around 01/24/2022) for Psoriasis, Eczema.  Graciella Belton, RMA, am acting as scribe for Forest Gleason, MD .  Documentation: I have reviewed the above documentation for accuracy and completeness, and I agree with the above.  Forest Gleason, MD

## 2021-08-30 ENCOUNTER — Telehealth: Payer: Self-pay

## 2021-08-30 ENCOUNTER — Ambulatory Visit
Admission: RE | Admit: 2021-08-30 | Discharge: 2021-08-30 | Disposition: A | Discharge status: Home / Self Care | Payer: Medicare Other | Source: Ambulatory Visit | Attending: Neurosurgery | Admitting: Neurosurgery

## 2021-08-30 ENCOUNTER — Other Ambulatory Visit: Payer: Self-pay

## 2021-08-30 DIAGNOSIS — M48062 Spinal stenosis, lumbar region with neurogenic claudication: Secondary | ICD-10-CM | POA: Insufficient documentation

## 2021-08-30 MED ORDER — GADOBUTROL 1 MMOL/ML IV SOLN
6.0000 mL | Freq: Once | INTRAVENOUS | Status: AC | PRN
  Administered 2021-08-30: 6 mL via INTRAVENOUS

## 2021-08-30 NOTE — Telephone Encounter (Signed)
Spoke with patient and significant other today on the phone. We went over questions they had regarding the [facility] patient assistance packet and some Prednisone questions.   I did ask about the TB treatment due to [name] had been trying to contact them. [name] states at this time they are declining any treatment due she is leaving again to go out of the country and this has been a on going issue that has been dormant for years.

## 2021-08-31 ENCOUNTER — Telehealth: Payer: Self-pay

## 2021-08-31 ENCOUNTER — Encounter: Payer: Self-pay | Admitting: Dermatology

## 2021-08-31 NOTE — Telephone Encounter (Signed)
Patient's significant other advised of information per Dr. Laurence Ferrari. aw

## 2021-08-31 NOTE — Telephone Encounter (Signed)
Patient has been approved for Patient Assistance with [employer] through 2023. Patient can not have any early shipments at this time so she will only be able to take about a months worth with her to the [place]. She will not be back until the end of March. How should she do dosing? Should she d/c while she is away and start back up when she returns?

## 2021-08-31 NOTE — Telephone Encounter (Signed)
Hi Meredith [name], They told me she did NOT have latent tuberculosis at our appointment and that the health department had no concerns and did not recommend treatment for latent tuberculosis. I told them I needed to request their records to confirm the results. [name] found out afterward that her chest xray DID show latent TB. I DO NOT advise taking prednisone as this could put her at risk of reactivating latent tuberculosis. Please advise her NOT to take the prednisone if they have not already taken it. Thank you.

## 2021-09-01 NOTE — Telephone Encounter (Signed)
Ok. Can we try sending a 3 month supply and see if [employer] can do that? Otherwise we will try to give her 3 months-ish worth of samples I suppose. Maybe rep can help [place] with extra samples to help cover. Thank you!

## 2021-09-01 NOTE — Telephone Encounter (Signed)
Can you call the rep and ask if they can help? I'd like to prescribe 3 months at once and have her take with, plus give her 2-3 sample packs so she doesn't run out. I'm hoping the [employer] rep can help [place] figure this out since someone should be able to continue therapy while traveling. Thank you!

## 2021-09-01 NOTE — Telephone Encounter (Signed)
From what I understand the rep wont have any connections to this case. This patient is strictly with [employer] and Patient Assistance. I do know our rep would be more then happy to supply [place] with extra samples and we currently have a lot in the closet. I can offer them to come pick up some more before they leave?

## 2021-09-02 NOTE — Telephone Encounter (Signed)
Significant other advised of information and will be coming to pick up additional samples before they leave.

## 2021-09-04 ENCOUNTER — Other Ambulatory Visit: Payer: Self-pay | Admitting: Dermatology

## 2021-09-04 DIAGNOSIS — L309 Dermatitis, unspecified: Secondary | ICD-10-CM

## 2021-09-07 ENCOUNTER — Ambulatory Visit
Admission: RE | Admit: 2021-09-07 | Discharge: 2021-09-07 | Disposition: A | Discharge status: Home / Self Care | Payer: Medicare Other | Source: Ambulatory Visit | Attending: Internal Medicine | Admitting: Internal Medicine

## 2021-09-07 ENCOUNTER — Other Ambulatory Visit: Payer: Self-pay

## 2021-09-07 DIAGNOSIS — Z1231 Encounter for screening mammogram for malignant neoplasm of breast: Secondary | ICD-10-CM | POA: Diagnosis not present

## 2021-09-08 ENCOUNTER — Telehealth: Payer: Self-pay

## 2021-09-08 ENCOUNTER — Ambulatory Visit: Payer: Medicare Other | Admitting: Family Medicine

## 2021-09-08 NOTE — Telephone Encounter (Signed)
TC from patient's significant other.  States that they met with patient's dr yesterday and he said to go ahead with LTBI tx.  Patient will be out of the country and can't start LTBI tx until April  Informed gentlemen that they can call back in March and we can get the patient in for April.  Explained schedules are not set up that far in advance.  Verbalized understanding. Aileen Fass, RN    Positive QFT 05/2021. CXR without Active TB 08/11/21.  Patient will need updated EPI only.  CXR is still in 2 year range so will not need an updated CXR unless patient reports sxs. Will need LTBI orders from medical director. Aileen Fass, RN

## 2022-01-24 ENCOUNTER — Other Ambulatory Visit: Payer: Self-pay | Admitting: Physician Assistant

## 2022-01-24 ENCOUNTER — Ambulatory Visit
Admission: RE | Admit: 2022-01-24 | Discharge: 2022-01-24 | Disposition: A | Discharge status: Home / Self Care | Payer: Medicare Other | Source: Ambulatory Visit | Attending: Physician Assistant | Admitting: Physician Assistant

## 2022-01-24 DIAGNOSIS — S0990XA Unspecified injury of head, initial encounter: Secondary | ICD-10-CM | POA: Insufficient documentation

## 2022-01-24 DIAGNOSIS — W19XXXA Unspecified fall, initial encounter: Secondary | ICD-10-CM

## 2022-01-24 DIAGNOSIS — Y92009 Unspecified place in unspecified non-institutional (private) residence as the place of occurrence of the external cause: Secondary | ICD-10-CM | POA: Insufficient documentation

## 2022-01-25 ENCOUNTER — Other Ambulatory Visit: Payer: Self-pay

## 2022-01-25 DIAGNOSIS — L409 Psoriasis, unspecified: Secondary | ICD-10-CM

## 2022-01-25 MED ORDER — OTEZLA 30 MG PO TABS: 30.0000 mg | ORAL_TABLET | Freq: Two times a day (BID) | ORAL | 0 refills | Status: DC

## 2022-01-25 NOTE — Progress Notes (Signed)
1 RF of Otezla from [facility] fax request. aw ?

## 2022-02-01 ENCOUNTER — Other Ambulatory Visit: Payer: Self-pay | Admitting: Orthopedic Surgery

## 2022-02-01 DIAGNOSIS — M1612 Unilateral primary osteoarthritis, left hip: Secondary | ICD-10-CM

## 2022-02-03 ENCOUNTER — Ambulatory Visit
Admission: RE | Admit: 2022-02-03 | Discharge: 2022-02-03 | Disposition: A | Discharge status: Home / Self Care | Payer: Medicare Other | Source: Ambulatory Visit | Attending: Orthopedic Surgery | Admitting: Orthopedic Surgery

## 2022-02-03 DIAGNOSIS — M1612 Unilateral primary osteoarthritis, left hip: Secondary | ICD-10-CM | POA: Insufficient documentation

## 2022-02-08 ENCOUNTER — Ambulatory Visit (INDEPENDENT_AMBULATORY_CARE_PROVIDER_SITE_OTHER): Payer: Medicare Other | Admitting: Dermatology

## 2022-02-08 DIAGNOSIS — Z79899 Other long term (current) drug therapy: Secondary | ICD-10-CM | POA: Diagnosis not present

## 2022-02-08 DIAGNOSIS — L309 Dermatitis, unspecified: Secondary | ICD-10-CM

## 2022-02-08 DIAGNOSIS — L409 Psoriasis, unspecified: Secondary | ICD-10-CM

## 2022-02-08 MED ORDER — BETAMETHASONE DIPROPIONATE 0.05 % EX OINT: TOPICAL_OINTMENT | CUTANEOUS | 1 refills | Status: DC

## 2022-02-08 MED ORDER — OTEZLA 30 MG PO TABS: 30.0000 mg | ORAL_TABLET | Freq: Two times a day (BID) | ORAL | 5 refills | Status: DC

## 2022-02-08 MED ORDER — TACROLIMUS 0.1 % EX OINT: TOPICAL_OINTMENT | CUTANEOUS | 2 refills | Status: DC

## 2022-02-08 NOTE — Progress Notes (Signed)
? ?Follow-Up Visit ?  ?Subjective  ?Meredith [name] is a 57 y.o. female who presents for the following: Psoriasis (Right lower leg, improved, still some itch. Otezla '30mg'$  bid, patient tolerating ok, and tacrolimus ointment. ) and Eczema (Face, body. Improved with tacrolimus ointment prn. ). No side effects from [place], but R leg is still very itchy. ? ? ?The following portions of the chart were reviewed this encounter and updated as appropriate:  ?  ?  ? ?Review of Systems:  No other skin or systemic complaints except as noted in HPI or Assessment and Plan. ? ?Objective  ?Well appearing patient in no apparent distress; mood and affect are within normal limits. ? ?A focused examination was performed including face, right leg. Relevant physical exam findings are noted in the Assessment and Plan. ? ?Right Lower Leg ?Large thin light pink plaque of the right lower leg ? ?face, body ?Mild erythema/crusting nasal tip/nostrils, decreased lichenifcation of the upper lip ? ? ? ?Assessment & Plan  ?Psoriasis ?Right Lower Leg ? ?Chronic and persistent condition with duration or expected duration over one year. Condition is symptomatic/ bothersome to patient. Not currently at goal.  ? ?Psoriasis is a chronic non-curable, but treatable genetic/hereditary disease that may have other systemic features affecting other organ systems such as joints (Psoriatic Arthritis). It is associated with an increased risk of inflammatory bowel disease, heart disease, non-alcoholic fatty liver disease, and depression.   ? ?Discussed Xtrac laser. May start after returning from trip and pending insurance approval. ? ?Restart betamethasone dipropionate 0.05% ointment Apply to AA right lower leg bid x 2-4 weeks until improved, then bid on the weekends only until clear  dsp 45g 1Rf. Avoid face, groin, axilla.  ? ?Continue Otezla '30mg'$  take 1 PO bid dsp #60 5Rf. ? ?Continue tacrolimus 0.1% ointment qd/bid Aas leg. ? ?Topical steroids (such as  triamcinolone, fluocinolone, fluocinonide, mometasone, clobetasol, halobetasol, betamethasone, hydrocortisone) can cause thinning and lightening of the skin if they are used for too long in the same area. Your physician has selected the right strength medicine for your problem and area affected on the body. Please use your medication only as directed by your physician to prevent side effects.  ? ?Side effects of Otezla (apremilast) include diarrhea, nausea, headache, upper respiratory infection, depression, and weight decrease (5-10%). It should only be taken by pregnant women after a discussion regarding risks and benefits with their doctor. Goal is control of skin condition, not cure.  The use of [employer] requires long term medication management, including periodic office visits. ? ? ?Related Medications ?betamethasone dipropionate (DIPROLENE) 0.05 % ointment ?Apply twice daily to affected areas at legs x 2-4 weeks then twice daily weekends only. Avoid applying to face, groin, and axilla. Use as directed. Risk of skin atrophy with long-term use reviewed. ? ?tacrolimus (PROTOPIC) 0.1 % ointment ?Apply topically once to twice daily to affected areas right leg, face. ? ?Apremilast (OTEZLA) 30 MG TABS ?Take 1 tablet (30 mg total) by mouth 2 (two) times daily. ? ?Eczema, unspecified type ?face, body ? ?Improved. ? ?Recommend mild soap and moisturizing cream 1-2 times daily.  Gentle skin care handout provided.  ? ?Continue tacrolimus 0.1% ointment qd/bid face/body Aas prn.  ? ?Atopic dermatitis (eczema) is a chronic, relapsing, pruritic condition that can significantly affect quality of life. It is often associated with allergic rhinitis and/or asthma and can require treatment with topical medications, phototherapy, or in severe cases biologic injectable medication (Dupixent; Adbry) or Oral JAK inhibitors.  ? ? ?  Return in about 1 month (around 03/10/2022) for Psoriasis. ? ?I, Jamesetta Orleans, CMA, am acting as scribe for  Brendolyn Patty, MD . ?Documentation: I have reviewed the above documentation for accuracy and completeness, and I agree with the above. ? ?Brendolyn Patty MD  ? ? ?

## 2022-02-08 NOTE — Patient Instructions (Addendum)
Betamethasone dipropionate ointment (avoid using on face, groin, underarms) - apply to right lower leg twice daily x 2-4 weeks, then decrease to once a day and add tacrolimus ointment (Protopic) once a day. After another 2 weeks, use betamethasone dipropionate only on the weekends as needed and use tacrolimus ointment during the week Mon-Fri. ?Topical steroids (such as triamcinolone, fluocinolone, fluocinonide, mometasone, clobetasol, halobetasol, betamethasone, hydrocortisone) can cause thinning and lightening of the skin if they are used for too long in the same area. Your physician has selected the right strength medicine for your problem and area affected on the body. Please use your medication only as directed by your physician to prevent side effects.  ? ?Side effects of Otezla (apremilast) include diarrhea, nausea, headache, upper respiratory infection, depression, and weight decrease (5-10%). It should only be taken by pregnant women after a discussion regarding risks and benefits with their doctor. Goal is control of skin condition, not cure.  The use of [employer] requires long term medication management, including periodic office visits. ? ? ?If You Need Anything After Your Visit ? ?If you have any questions or concerns for your doctor, please call our main line at [phone] and press option 4 to reach your doctor's medical assistant. If no one answers, please leave a voicemail as directed and we will return your call as soon as possible. Messages left after 4 pm will be answered the following business day.  ? ?You may also send [place] a message via MyChart. We typically respond to MyChart messages within 1-2 business days. ? ?For prescription refills, please ask your pharmacy to contact our office. Our fax number is [phone]. ? ?If you have an urgent issue when the clinic is closed that cannot wait until the next business day, you can page your doctor at the number below.   ? ?Please note that while we do  our best to be available for urgent issues outside of office hours, we are not available 24/7.  ? ?If you have an urgent issue and are unable to reach [place], you may choose to seek medical care at your doctor's office, retail clinic, urgent care center, or emergency room. ? ?If you have a medical emergency, please immediately call 911 or go to the emergency department. ? ?Pager Numbers ? ?- Dr. Nehemiah Massed: [phone] ? ?- Dr. Laurence Ferrari: [phone] ? ?- Dr. Nicole Kindred: [phone] ? ?In the event of inclement weather, please call our main line at [phone] for an update on the status of any delays or closures. ? ?Dermatology Medication Tips: ?Please keep the boxes that topical medications come in in order to help keep track of the instructions about where and how to use these. Pharmacies typically print the medication instructions only on the boxes and not directly on the medication tubes.  ? ?If your medication is too expensive, please contact our office at [phone] option 4 or send [place] a message through [place].  ? ?We are unable to tell what your co-pay for medications will be in advance as this is different depending on your insurance coverage. However, we may be able to find a substitute medication at lower cost or fill out paperwork to get insurance to cover a needed medication.  ? ?If a prior authorization is required to get your medication covered by your insurance company, please allow [place] 1-2 business days to complete this process. ? ?Drug prices often vary depending on where the prescription is filled and some pharmacies may offer cheaper prices. ? ?The website  www.goodrx.com contains coupons for medications through different pharmacies. The prices here do not account for what the cost may be with help from insurance (it may be cheaper with your insurance), but the website can give you the price if you did not use any insurance.  ?- You can print the associated coupon and take it with your prescription to the  pharmacy.  ?- You may also stop by our office during regular business hours and pick up a GoodRx coupon card.  ?- If you need your prescription sent electronically to a different pharmacy, notify our office through [facility] or by phone at [phone] option 4. ? ? ? ? ?Si Usted Necesita Algo Despu?s de Su Visita ? ?Tambi?n puede enviarnos un mensaje a trav?s de MyChart. Por lo general respondemos a los mensajes de MyChart en el transcurso de 1 a 2 d?as h?biles. ? ?Para renovar recetas, por favor pida a su farmacia que se ponga en contacto con nuestra oficina. Nuestro n?mero de fax es el [phone]. ? ?Si tiene un asunto urgente cuando la cl?nica est? cerrada y que no puede esperar hasta el siguiente d?a h?bil, puede llamar/localizar a su doctor(a) al n?mero que aparece a continuaci?n.  ? ?Por favor, tenga en cuenta que aunque hacemos todo lo posible para estar disponibles para asuntos urgentes fuera del horario de oficina, no estamos disponibles las 24 horas del d?a, los 7 d?as de la semana.  ? ?Si tiene un problema urgente y no puede comunicarse con nosotros, puede optar por buscar atenci?n m?dica  en el consultorio de su doctor(a), en una cl?nica privada, en un centro de atenci?n urgente o en una sala de emergencias. ? ?Si tiene Engineer, maintenance (IT) m?dica, por favor llame inmediatamente al 911 o vaya a la sala de emergencias. ? ?N?meros de b?per ? ?- Dr. Nehemiah Massed: [phone] ? ?- Dra. Moye: [phone] ? ?- Dra. Nicole Kindred: [phone] ? ?En caso de inclemencias del tiempo, por favor llame a nuestra l?nea principal al [phone] para una actualizaci?n sobre el estado de cualquier retraso o cierre. ? ?Consejos para la medicaci?n en dermatolog?a: ?Por favor, guarde las cajas en las que vienen los medicamentos de uso t?pico para ayudarle a seguir las instrucciones sobre d?nde y c?mo usarlos. Las farmacias generalmente imprimen las instrucciones del medicamento s?lo en las cajas y no directamente en los  tubos del [place].  ? ?Si su medicamento es muy caro, por favor, p?ngase en contacto con Zigmund Daniel llamando al [phone] y presione la opci?n 4 o env?enos un mensaje a trav?s de MyChart.  ? ?No podemos decirle cu?l ser? su copago por los medicamentos por adelantado ya que esto es diferente dependiendo de la cobertura de su seguro. Sin embargo, es posible que podamos encontrar un medicamento sustituto a Electrical engineer un formulario para que el seguro cubra el medicamento que se considera necesario.  ? ?Si se requiere Ardelia Mems autorizaci?n previa para que su compa??a de seguros [place] su medicamento, por favor perm?tanos de 1 a 2 d?as h?biles para completar este proceso. ? ?Los precios de los medicamentos var?an con frecuencia dependiendo del Environmental consultant de d?nde se surte la receta y alguna farmacias pueden ofrecer precios m?s baratos. ? ?El sitio web www.goodrx.com tiene cupones para medicamentos de Airline pilot. Los precios aqu? no tienen en cuenta lo que podr?a costar con la ayuda del seguro (puede ser m?s barato con su seguro), pero el sitio web puede darle el precio si no utiliz? ning?n seguro.  ?- Puede imprimir  el cup?n correspondiente y llevarlo con su receta a la farmacia.  ?- Tambi?n puede pasar por nuestra oficina durante el horario de atenci?n regular y recoger una tarjeta de cupones de GoodRx.  ?- Si necesita que su receta se env?e electr?nicamente a Chiropodist, informe a nuestra oficina a trav?s de MyChart de [place] o por tel?fono llamando al [phone] y presione la opci?n 4. ? ?

## 2022-02-10 ENCOUNTER — Ambulatory Visit: Payer: Medicare Other | Admitting: Dermatology

## 2022-02-14 ENCOUNTER — Ambulatory Visit: Payer: Medicare Other

## 2022-02-15 ENCOUNTER — Ambulatory Visit: Payer: Medicare Other | Admitting: Family Medicine

## 2022-02-16 ENCOUNTER — Ambulatory Visit (INDEPENDENT_AMBULATORY_CARE_PROVIDER_SITE_OTHER): Payer: Medicare Other | Admitting: Dermatology

## 2022-02-16 DIAGNOSIS — L400 Psoriasis vulgaris: Secondary | ICD-10-CM | POA: Diagnosis not present

## 2022-02-16 NOTE — Progress Notes (Signed)
Patient here today for xtrac treatment for psoriasis vulgaris.  ? ?Total Surface Area: 92cm2 ?Total Energy: 36.80J ?Service Code: [phone] ? ?Johnsie Kindred, RMA ? ?Documentation: I have reviewed the above documentation for accuracy and completeness, and I agree with the above. ? ?Brendolyn Patty MD  ?

## 2022-02-21 ENCOUNTER — Ambulatory Visit (INDEPENDENT_AMBULATORY_CARE_PROVIDER_SITE_OTHER): Payer: Medicare Other | Admitting: Dermatology

## 2022-02-21 DIAGNOSIS — L400 Psoriasis vulgaris: Secondary | ICD-10-CM

## 2022-02-21 NOTE — Progress Notes (Signed)
Patient here today xtrac treatment of psoriasis vulgaris.  ? ?Total Surface Area: 68cm2 ?Total Energy: 31.28J ?Service Code: [phone] ? ?Johnsie Kindred, RMA ? ?Documentation: I have reviewed the above documentation for accuracy and completeness, and I agree with the above. ? ?Brendolyn Patty MD  ? ?

## 2022-02-23 ENCOUNTER — Ambulatory Visit (INDEPENDENT_AMBULATORY_CARE_PROVIDER_SITE_OTHER): Payer: Medicare Other | Admitting: Dermatology

## 2022-02-23 DIAGNOSIS — L400 Psoriasis vulgaris: Secondary | ICD-10-CM | POA: Diagnosis not present

## 2022-02-23 NOTE — Progress Notes (Signed)
Patient here today xtrac treatment of psoriasis vulgaris.  ?  ?Total Surface Area: 68cm2 ?Total Energy: 31.28 ?Service [id number] ? ?Johnsie Kindred RMA ? ?Documentation: I have reviewed the above documentation for accuracy and completeness, and I agree with the above. ? ?Brendolyn Patty MD  ?

## 2022-03-01 ENCOUNTER — Ambulatory Visit (INDEPENDENT_AMBULATORY_CARE_PROVIDER_SITE_OTHER): Payer: Medicare Other | Admitting: Dermatology

## 2022-03-01 DIAGNOSIS — L400 Psoriasis vulgaris: Secondary | ICD-10-CM

## 2022-03-01 NOTE — Progress Notes (Signed)
Patient here today for xtrac treatment for psoriasis vulgaris. ? ?Total Surface Area: 44cm2 ?Total Energy: 20.24J ?Service Code: [phone] ? ?Johnsie Kindred, RMA ? ?Documentation: I have reviewed the above documentation for accuracy and completeness, and I agree with the above. ? ?Brendolyn Patty MD  ? ?

## 2022-03-03 ENCOUNTER — Ambulatory Visit (INDEPENDENT_AMBULATORY_CARE_PROVIDER_SITE_OTHER): Payer: Medicare Other | Admitting: Dermatology

## 2022-03-03 ENCOUNTER — Ambulatory Visit: Payer: Medicare Other

## 2022-03-03 DIAGNOSIS — L400 Psoriasis vulgaris: Secondary | ICD-10-CM

## 2022-03-03 NOTE — Progress Notes (Signed)
Patient here today for xtrac treatment of psoriasis vulgaris.  ? ?Total Surface Area: 64cm2 ?Total Energy: 33.86J ?Service Code: [phone] ? ?Johnsie Kindred, RMA ? ?Documentation: I have reviewed the above documentation for accuracy and completeness, and I agree with the above. ? ?Brendolyn Patty MD  ? ?

## 2022-03-08 ENCOUNTER — Ambulatory Visit (INDEPENDENT_AMBULATORY_CARE_PROVIDER_SITE_OTHER): Payer: Medicare Other | Admitting: Dermatology

## 2022-03-08 DIAGNOSIS — L400 Psoriasis vulgaris: Secondary | ICD-10-CM

## 2022-03-08 NOTE — Progress Notes (Signed)
Patient here today for xtrac treatment of psoriasis vulgaris.  ?  ?Total Surface Area: 60cm2 ?Total Energy: 36.48J ?Service Code: [phone] ?  ?Johnsie Kindred, RMA ? ?Documentation: I have reviewed the above documentation for accuracy and completeness, and I agree with the above. ? ?Brendolyn Patty MD  ?  ?

## 2022-03-10 ENCOUNTER — Ambulatory Visit (INDEPENDENT_AMBULATORY_CARE_PROVIDER_SITE_OTHER): Payer: Medicare Other | Admitting: Dermatology

## 2022-03-10 DIAGNOSIS — L400 Psoriasis vulgaris: Secondary | ICD-10-CM

## 2022-03-10 NOTE — Progress Notes (Signed)
Patient here today for xtrac treatment of psoriasis vulgaris.    Total Surface Area: 56cm2 Total Energy: 34.05J Service Code: [id number]   Johnsie Kindred, RMA  Documentation: I have reviewed the above documentation for accuracy and completeness, and I agree with the above.  Brendolyn Patty MD

## 2022-03-15 ENCOUNTER — Ambulatory Visit (INDEPENDENT_AMBULATORY_CARE_PROVIDER_SITE_OTHER): Payer: Medicare Other | Admitting: Dermatology

## 2022-03-15 ENCOUNTER — Encounter: Payer: Self-pay | Admitting: Family Medicine

## 2022-03-15 DIAGNOSIS — L409 Psoriasis, unspecified: Secondary | ICD-10-CM | POA: Diagnosis not present

## 2022-03-15 DIAGNOSIS — L400 Psoriasis vulgaris: Secondary | ICD-10-CM | POA: Diagnosis not present

## 2022-03-15 DIAGNOSIS — Z79899 Other long term (current) drug therapy: Secondary | ICD-10-CM

## 2022-03-15 NOTE — Patient Instructions (Addendum)
Continue Otezla 30 mg twice daily. Continue tacrolimus 2 times daily Monday - Friday.  Continue betamethasone once daily weekends only. Avoid applying to face, groin, and axilla. Use as directed. Long-term use can cause thinning of the skin.  Topical steroids (such as triamcinolone, fluocinolone, fluocinonide, mometasone, clobetasol, halobetasol, betamethasone, hydrocortisone) can cause thinning and lightening of the skin if they are used for too long in the same area. Your physician has selected the right strength medicine for your problem and area affected on the body. Please use your medication only as directed by your physician to prevent side effects.   Side effects of Otezla (apremilast) include diarrhea, nausea, headache, upper respiratory infection, depression, and weight decrease (5-10%). It should only be taken by pregnant women after a discussion regarding risks and benefits with their doctor. Goal is control of skin condition, not cure.  The use of Rutherford Nail requires long term medication management, including periodic office visits.   If You Need Anything After Your Visit  If you have any questions or concerns for your doctor, please call our main line at [phone] and press option 4 to reach your doctor's medical assistant. If no one answers, please leave a voicemail as directed and we will return your call as soon as possible. Messages left after 4 pm will be answered the following business day.   You may also send [place] a message via [place]. We typically respond to MyChart messages within 1-2 business days.  For prescription refills, please ask your pharmacy to contact our office. Our fax number is [phone].  If you have an urgent issue when the clinic is closed that cannot wait until the next business day, you can page your doctor at the number below.    Please note that while we do our best to be available for urgent issues outside of office hours, we are not available 24/7.    If you have an urgent issue and are unable to reach [place], you may choose to seek medical care at your doctor's office, retail clinic, urgent care center, or emergency room.  If you have a medical emergency, please immediately call 911 or go to the emergency department.  Pager Numbers  - Dr. Nehemiah Massed: [phone]  - Dr. Laurence Ferrari: [phone]  - Dr. Nicole Kindred: [phone]  In the event of inclement weather, please call our main line at [phone] for an update on the status of any delays or closures.  Dermatology Medication Tips: Please keep the boxes that topical medications come in in order to help keep track of the instructions about where and how to use these. Pharmacies typically print the medication instructions only on the boxes and not directly on the medication tubes.   If your medication is too expensive, please contact our office at [phone] option 4 or send [place] a message through [place].   We are unable to tell what your co-pay for medications will be in advance as this is different depending on your insurance coverage. However, we may be able to find a substitute medication at lower cost or fill out paperwork to get insurance to cover a needed medication.   If a prior authorization is required to get your medication covered by your insurance company, please allow [place] 1-2 business days to complete this process.  Drug prices often vary depending on where the prescription is filled and some pharmacies may offer cheaper prices.  The website www.goodrx.com contains coupons for medications through different pharmacies. The prices here do not account for  what the cost may be with help from insurance (it may be cheaper with your insurance), but the website can give you the price if you did not use any insurance.  - You can print the associated coupon and take it with your prescription to the pharmacy.  - You may also stop by our office during regular business hours and pick up a  GoodRx coupon card.  - If you need your prescription sent electronically to a different pharmacy, notify our office through [facility] or by phone at [phone] option 4.     Si Usted Necesita Algo Despus de Su Visita  Tambin puede enviarnos un mensaje a travs de Pharmacist, community. Por lo general respondemos a los mensajes de MyChart en el transcurso de 1 a 2 das hbiles.  Para renovar recetas, por favor pida a su farmacia que se ponga en contacto con nuestra oficina. Harland Dingwall de fax es [place] [phone].  Si tiene un asunto urgente cuando la clnica est cerrada y que no puede esperar hasta el siguiente da hbil, puede llamar/localizar a su doctor(a) al nmero que aparece a continuacin.   Por favor, tenga en cuenta que aunque hacemos todo lo posible para estar disponibles para asuntos urgentes fuera del horario de [place], no estamos disponibles las 24 horas del da, los 7 das de la [place].   Si tiene un problema urgente y no puede comunicarse con nosotros, puede optar por buscar atencin mdica  en el consultorio de su doctor(a), en una clnica privada, en un centro de atencin urgente o en una sala de emergencias.  Si tiene Engineering geologist, por favor llame inmediatamente al 911 o vaya a la sala de emergencias.  Nmeros de bper  - Dr. Nehemiah Massed: [phone]  - Dra. Moye: [phone]  - Dra. Nicole Kindred: [phone]  En caso de inclemencias del [place], por favor llame a Johnsie Kindred principal al [phone] para una actualizacin sobre el [place] de cualquier retraso o cierre.  Consejos para la medicacin en dermatologa: Por favor, guarde las cajas en las que vienen los medicamentos de uso tpico para ayudarle a seguir las instrucciones sobre dnde y cmo usarlos. Las farmacias generalmente imprimen las instrucciones del medicamento slo en las cajas y no directamente en los tubos del [place].   Si su medicamento es muy caro, por favor, pngase en contacto con  Zigmund Daniel llamando al [phone] y presione la opcin 4 o envenos un mensaje a travs de Pharmacist, community.   No podemos decirle cul ser su copago por los medicamentos por adelantado ya que esto es diferente dependiendo de la cobertura de su seguro. Sin embargo, es posible que podamos encontrar un medicamento sustituto a Electrical engineer un formulario para que el seguro cubra el medicamento que se considera necesario.   Si se requiere una autorizacin previa para que su compaa de seguros [place] su medicamento, por favor permtanos de 1 a 2 das hbiles para completar este proceso.  Los precios de los medicamentos varan con frecuencia dependiendo del Environmental consultant de dnde se surte la receta y alguna farmacias pueden ofrecer precios ms baratos.  El sitio web www.goodrx.com tiene cupones para medicamentos de Airline pilot. Los precios aqu no tienen en cuenta lo que podra costar con la ayuda del seguro (puede ser ms barato con su seguro), pero el sitio web puede darle el precio si no utiliz Research scientist (physical sciences).  - Puede imprimir el cupn correspondiente y llevarlo con su receta a la farmacia.  - Tambin puede  pasar por nuestra oficina durante el horario de atencin regular y Charity fundraiser una tarjeta de cupones de GoodRx.  - Si necesita que su receta se enve electrnicamente a una farmacia diferente, informe a nuestra oficina a travs de MyChart de [place] o por telfono llamando al [phone] y presione la opcin 4.

## 2022-03-15 NOTE — Progress Notes (Signed)
Patient here today for xtrac treatment of psoriasis vulgaris.    Total Surface Area: 68cm2 Total Energy: 47.53 Service Code: 30940   Johnsie Kindred, RMA  Documentation: I have reviewed the above documentation for accuracy and completeness, and I agree with the above.  Brendolyn Patty MD

## 2022-03-15 NOTE — Progress Notes (Signed)
   Follow-Up Visit   Subjective  Meredith [name] is a 56 y.o. female who presents for the following: Follow-up (Patient here today for psoriasis follow up. Patient currently using tacrolimus, betamethasone and is having Xtrac laser treatments to right lower leg. Patient also taking Otezla 30 mg bid.).  Patient advises she does still have itch but has had some improvement overall.   The following portions of the chart were reviewed this encounter and updated as appropriate:       Review of Systems:  No other skin or systemic complaints except as noted in HPI or Assessment and Plan.  Objective  Well appearing patient in no apparent distress; mood and affect are within normal limits.  A focused examination was performed including right lower leg. Relevant physical exam findings are noted in the Assessment and Plan.  Right Lower Leg Pink patch with some thinning and minimal scale    Assessment & Plan  Psoriasis Right Lower Leg  Chronic and persistent condition with duration or expected duration over one year. Condition is symptomatic/ bothersome to patient. Improving but not currently at goal.   Psoriasis is a chronic non-curable, but treatable genetic/hereditary disease that may have other systemic features affecting other organ systems such as joints (Psoriatic Arthritis). It is associated with an increased risk of inflammatory bowel disease, heart disease, non-alcoholic fatty liver disease, and depression.    Continue XTRAC 2x/wk until leaves for [place] next month Continue Otezla 30 mg twice daily. Continue tacrolimus 2 times daily Monday - Friday.  Continue betamethasone once daily weekends only. Avoid applying to face, groin, and axilla. Use as directed. Long-term use can cause thinning of the skin.  Topical steroids (such as triamcinolone, fluocinolone, fluocinonide, mometasone, clobetasol, halobetasol, betamethasone, hydrocortisone) can cause thinning and lightening of  the skin if they are used for too long in the same area. Your physician has selected the right strength medicine for your problem and area affected on the body. Please use your medication only as directed by your physician to prevent side effects.   Side effects of Otezla (apremilast) include diarrhea, nausea, headache, upper respiratory infection, depression, and weight decrease (5-10%). It should only be taken by pregnant women after a discussion regarding risks and benefits with their doctor. Goal is control of skin condition, not cure.  The use of Rutherford Nail requires long term medication management, including periodic office visits.   Related Medications betamethasone dipropionate (DIPROLENE) 0.05 % ointment Apply twice daily to affected areas at legs x 2-4 weeks then twice daily weekends only. Avoid applying to face, groin, and axilla. Use as directed. Risk of skin atrophy with long-term use reviewed.  tacrolimus (PROTOPIC) 0.1 % ointment Apply topically once to twice daily to affected areas right leg, face.  Apremilast (OTEZLA) 30 MG TABS Take 1 tablet (30 mg total) by mouth 2 (two) times daily.   Return in about 5 months (around 08/15/2022) for Psoriasis.  Graciella Belton, RMA, am acting as scribe for Brendolyn Patty, MD .  Documentation: I have reviewed the above documentation for accuracy and completeness, and I agree with the above.  Brendolyn Patty MD

## 2022-03-15 NOTE — Progress Notes (Unsigned)
Meredith [name] [address] [place] Phone: [phone] Subjective:   [name], am serving as a scribe for Dr. Hulan Saas.  I'm seeing this patient by the request  of:  Adin Hector, MD  CC: Back pain  [id number]  Meredith [name] is a 56 y.o. female coming in with complaint of back and neck pain. OMT on 08/04/2022. Patient states not here for manipulation. Second opinion on left hip. Been in PT for a month no improvement. Has had many injections.  Patient states that the pain seems to start in the buttocks area radiate to the lateral aspect of the hip to the lateral aspect of the knee and all the way to her foot.  Finding it difficult to sleep and do any daily activities.  Medications patient has been prescribed: None  Taking:  Patient did have a left hip Reviewed patient's most recent MRI of the left hip from April of this year.  showing the patient had very mild degenerative changes noted of the hip.  No evidence of bursitis.  Mild degenerative changes of the anterior labrum noted and very mild tendinopathy of the hamstring proximally.       Reviewed prior external information including notes and imaging from previsou exam, outside providers and external EMR if available.   As well as notes that were available from care everywhere and other healthcare systems.  Past medical history, social, surgical and family history all reviewed in electronic medical record.  No pertanent information unless stated regarding to the chief complaint.   Past Medical History:  Diagnosis Date   Anemia    Anxiety    Asthma    Chronic insomnia    Depression    Dermatitis    Gastroparesis    GERD (gastroesophageal reflux disease)    Hypertension    Hypothyroidism    Pre-diabetes    Psoriasis    Rhinitis     Allergies  Allergen Reactions   Latex Rash   Shellfish Allergy Rash    Not all the time    Amoxicillin-Pot Clavulanate      unknown     Review of Systems:  No headache, visual changes, nausea, vomiting, diarrhea, constipation, dizziness, abdominal pain, skin rash, fevers, chills, night sweats, weight loss, swollen lymph nodes, body aches, joint swelling, chest pain, shortness of breath, mood changes. POSITIVE muscle aches  Objective  Blood pressure 126/80, pulse 91, height 5' (1.524 m), weight 136 lb (61.7 kg), SpO2 98 %.   General: No apparent distress alert and oriented x3 mood and affect normal, dressed appropriately.  HEENT: Pupils equal, extraocular movements intact  Respiratory: Patient's speak in full sentences and does not appear short of breath  Cardiovascular: No lower extremity edema, non tender, no erythema  Low back exam does have some loss of lordosis.  Patient is tender to palpation in the paraspinal musculature of the lumbar spine around the L4-S1 area.  Mild positive straight leg test.  Worsening pain though still noted with FABER test.  Diffuse tenderness of the gluteal area noted.  On strength testing patient noted does have some weakness with 3+ dorsiflexion of the       Assessment and Plan:   Lumbosacral radiculopathy at L5 Unfortunately I do think patient is having recurrent of lower back pain.  Did review patient's MRI in the entirety including the hip.  Unfortunately I am more concerned that this is more radicular symptoms again.  Patient  is even having some weakness with dorsiflexion and plantarflexion which is new since the last visit.  Concerned that patient is having more of a nerve root impingement.  I do feel because of this and its affecting daily activities and failing all other conservative therapy that advanced imaging is warranted.  We will get MRI of the lumbar spine to further evaluate.  Depending on findings we will send everything back to her surgeon to discuss other treatment options.   Toradol and Depo-Medrol given today as well.     The above documentation has  been reviewed and is accurate and complete Lyndal Pulley, DO        Note: This dictation was prepared with Dragon dictation along with smaller phrase technology. Any transcriptional errors that result from this process are unintentional.

## 2022-03-16 ENCOUNTER — Ambulatory Visit (INDEPENDENT_AMBULATORY_CARE_PROVIDER_SITE_OTHER): Payer: Medicare Other | Admitting: Family Medicine

## 2022-03-16 ENCOUNTER — Ambulatory Visit (INDEPENDENT_AMBULATORY_CARE_PROVIDER_SITE_OTHER): Payer: Medicare Other

## 2022-03-16 VITALS — BP 126/80 | HR 91 | Ht 60.0 in | Wt 136.0 lb

## 2022-03-16 DIAGNOSIS — M5417 Radiculopathy, lumbosacral region: Secondary | ICD-10-CM | POA: Diagnosis not present

## 2022-03-16 DIAGNOSIS — M48062 Spinal stenosis, lumbar region with neurogenic claudication: Secondary | ICD-10-CM | POA: Diagnosis not present

## 2022-03-16 IMAGING — MG MM DIGITAL SCREENING BILAT W/ TOMO AND CAD
6 of 10 series · 6 of 30 positions shown · non-contrast
Comparison: None.

CLINICAL DATA: Screening.

EXAM:
DIGITAL SCREENING BILATERAL MAMMOGRAM WITH TOMOSYNTHESIS AND CAD
TECHNIQUE: Bilateral screening digital craniocaudal and mediolateral oblique
mammograms were obtained. Bilateral screening digital breast
tomosynthesis was performed. The images were evaluated with
computer-aided detection.

[L XCCL synth-2D]
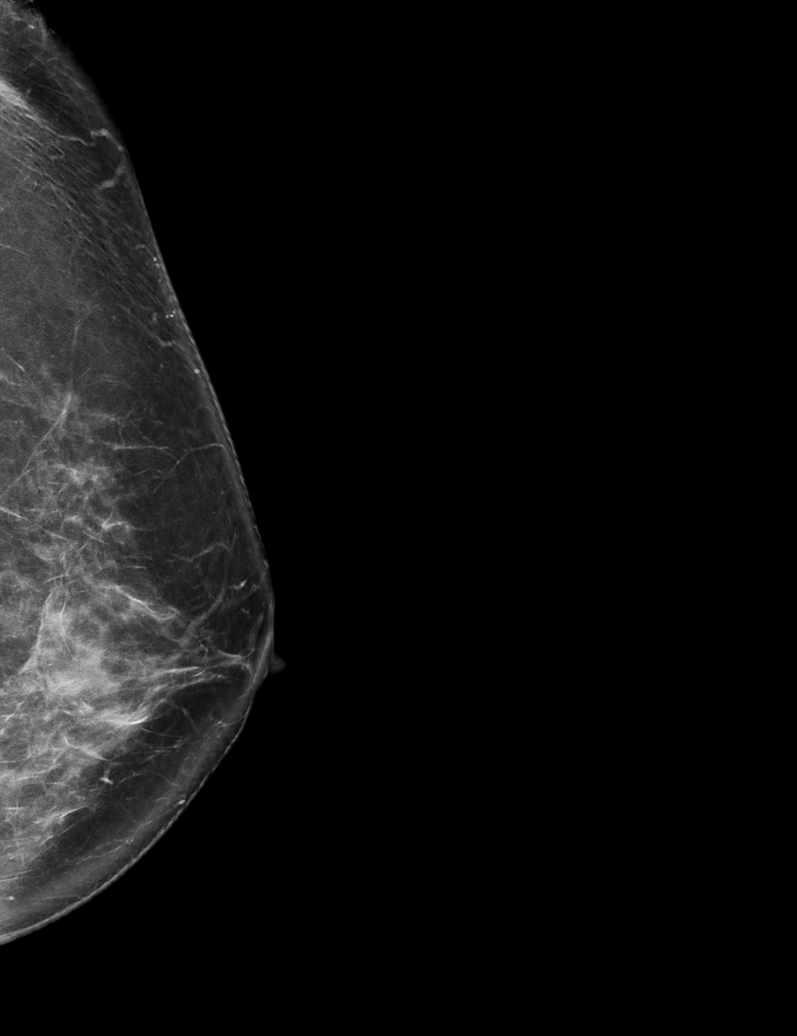

[L CC synth-2D]
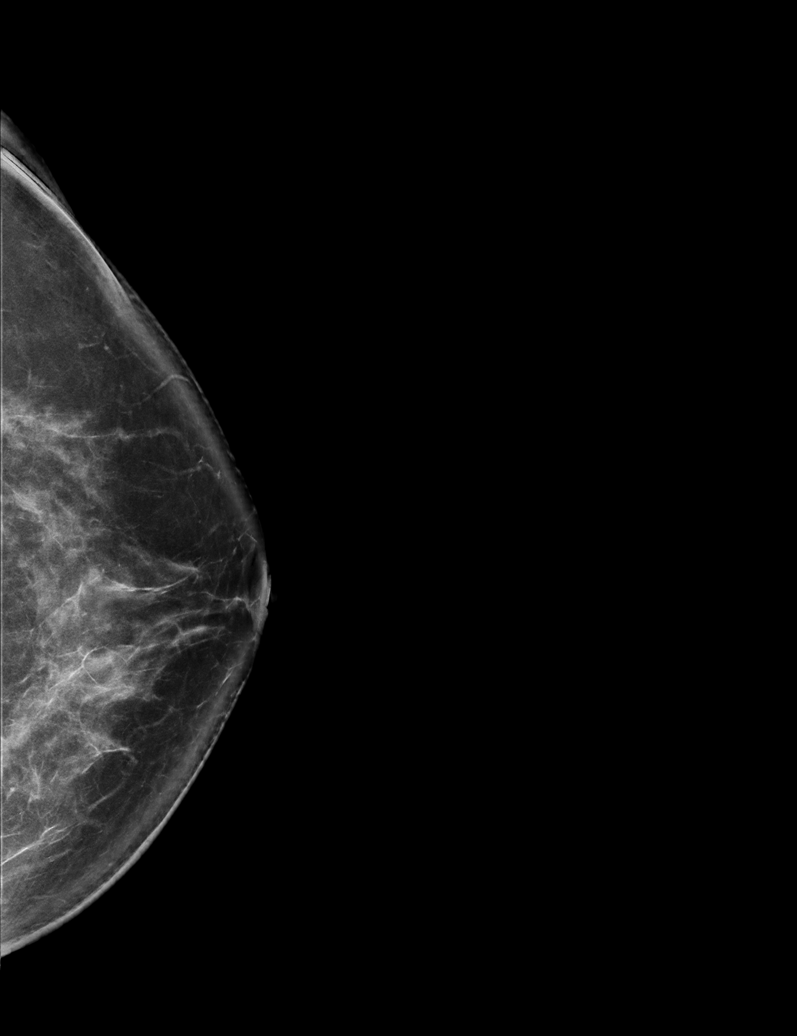

[R MLO synth-2D]
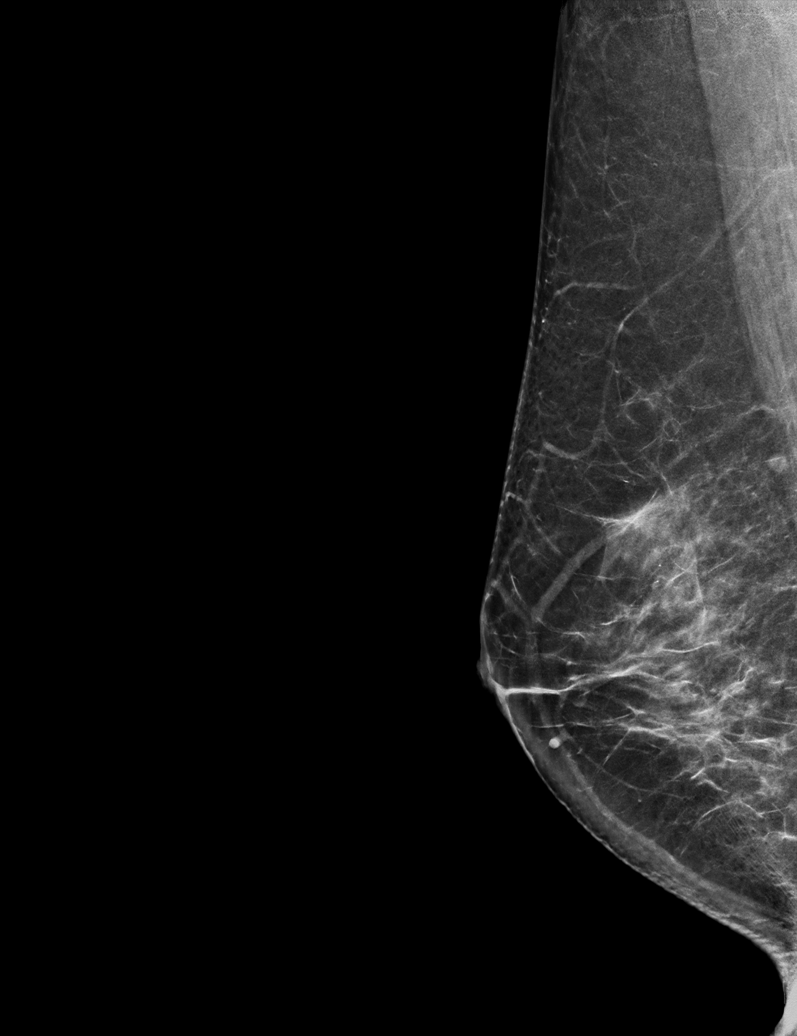

[R CC synth-2D]
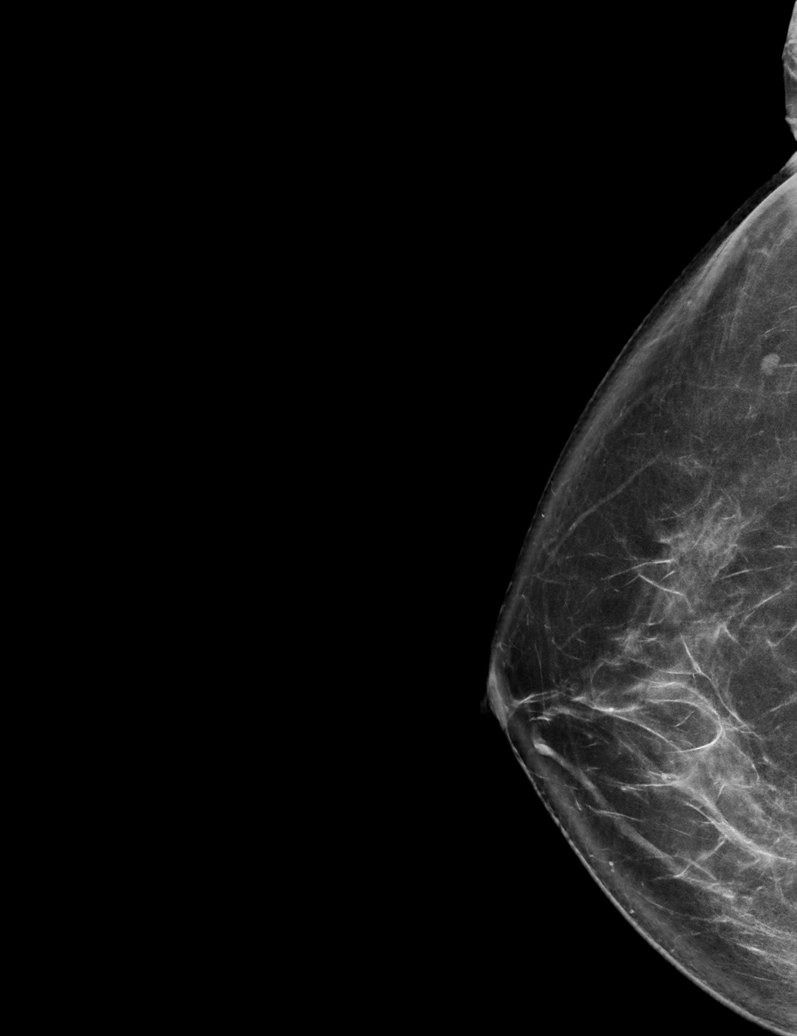

[L MLO synth-2D]
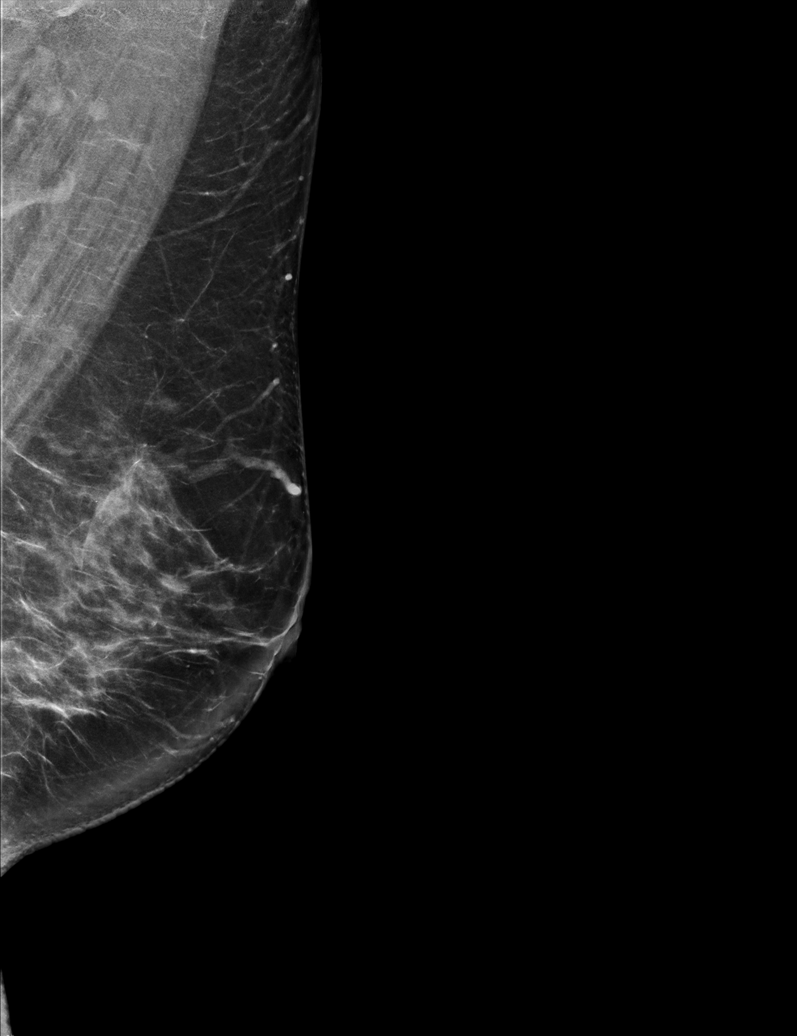

[L CC tomo · tomo slice 41/80.0]
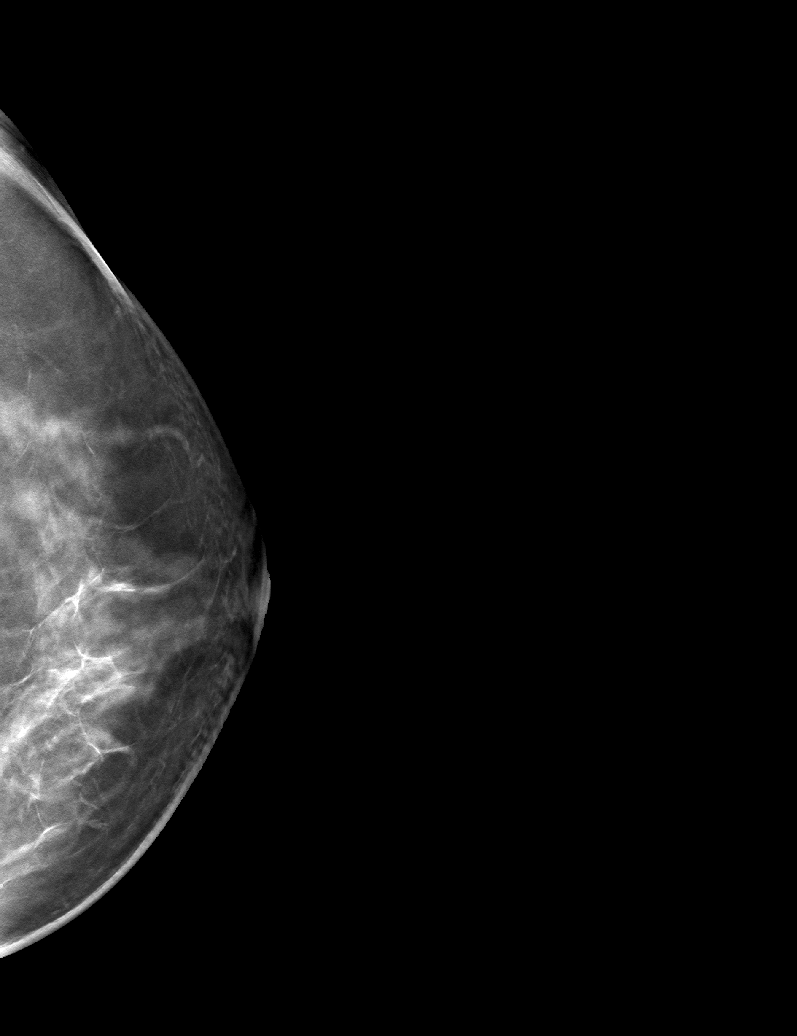

[6 of 30 positions shown; findings below may reference images not displayed]

ACR Breast Density Category c: The breast tissue is heterogeneously
dense, which may obscure small masses
FINDINGS: There are no findings suspicious for malignancy.
IMPRESSION: No mammographic evidence of malignancy. A result letter of this
screening mammogram will be mailed directly to the patient.

RECOMMENDATION:
Screening mammogram in one year. (Code:C8-T-HNK)

BI-RADS CATEGORY  1: Negative.

## 2022-03-16 MED ORDER — METHYLPREDNISOLONE ACETATE 40 MG/ML IJ SUSP
40.0000 mg | Freq: Once | INTRAMUSCULAR | Status: AC
  Administered 2022-03-16: 40 mg via INTRAMUSCULAR

## 2022-03-16 MED ORDER — KETOROLAC TROMETHAMINE 30 MG/ML IJ SOLN
30.0000 mg | Freq: Once | INTRAMUSCULAR | Status: AC
  Administered 2022-03-16: 30 mg via INTRAMUSCULAR

## 2022-03-16 NOTE — Patient Instructions (Addendum)
Injection today Xray today  MRI at [facility]  When we receive your results we will contact you.

## 2022-03-17 ENCOUNTER — Ambulatory Visit (INDEPENDENT_AMBULATORY_CARE_PROVIDER_SITE_OTHER): Payer: Medicare Other | Admitting: Dermatology

## 2022-03-17 DIAGNOSIS — L400 Psoriasis vulgaris: Secondary | ICD-10-CM

## 2022-03-17 NOTE — Assessment & Plan Note (Signed)
Unfortunately I do think patient is having recurrent of lower back pain.  Did review patient's MRI in the entirety including the hip.  Unfortunately I am more concerned that this is more radicular symptoms again.  Patient is even having some weakness with dorsiflexion and plantarflexion which is new since the last visit.  Concerned that patient is having more of a nerve root impingement.  I do feel because of this and its affecting daily activities and failing all other conservative therapy that advanced imaging is warranted.  We will get MRI of the lumbar spine to further evaluate.  Depending on findings we will send everything back to her surgeon to discuss other treatment options.

## 2022-03-17 NOTE — Progress Notes (Signed)
Patient here today for xtrac treatment of psoriasis vulgaris.    Total Surface Area: 68cm2 Total Energy: 54.60J Service Code: 14239   Johnsie Kindred, RMA  Documentation: I have reviewed the above documentation for accuracy and completeness, and I agree with the above.  Brendolyn Patty MD

## 2022-03-22 ENCOUNTER — Ambulatory Visit (INDEPENDENT_AMBULATORY_CARE_PROVIDER_SITE_OTHER): Payer: Medicare Other | Admitting: Dermatology

## 2022-03-22 DIAGNOSIS — L400 Psoriasis vulgaris: Secondary | ICD-10-CM | POA: Diagnosis not present

## 2022-03-22 NOTE — Progress Notes (Signed)
Patient here today for xtrac treatment of psoriasis vulgaris.    Total Surface Area: 48cm2 Total Energy: 44.30J Service Code: [id number]   Johnsie Kindred, RMA  Documentation: I have reviewed the above documentation for accuracy and completeness, and I agree with the above.  Brendolyn Patty MD

## 2022-03-24 ENCOUNTER — Ambulatory Visit (INDEPENDENT_AMBULATORY_CARE_PROVIDER_SITE_OTHER): Payer: Medicare Other | Admitting: Dermatology

## 2022-03-24 DIAGNOSIS — L400 Psoriasis vulgaris: Secondary | ICD-10-CM

## 2022-03-24 NOTE — Progress Notes (Signed)
Patient here today for xtrac treatment of psoriasis vulgaris.    Total Surface Area: 52cm2 Total Energy: 55.17J Service Code: [id number]   Johnsie Kindred, RMA  Documentation: I have reviewed the above documentation for accuracy and completeness, and I agree with the above.  Brendolyn Patty MD

## 2022-03-27 ENCOUNTER — Ambulatory Visit
Admission: RE | Admit: 2022-03-27 | Discharge: 2022-03-27 | Disposition: A | Discharge status: Home / Self Care | Payer: Medicare Other | Source: Ambulatory Visit | Attending: Family Medicine | Admitting: Family Medicine

## 2022-03-27 DIAGNOSIS — M48062 Spinal stenosis, lumbar region with neurogenic claudication: Secondary | ICD-10-CM | POA: Diagnosis present

## 2022-03-29 ENCOUNTER — Encounter: Payer: Self-pay | Admitting: Family Medicine

## 2022-03-29 ENCOUNTER — Ambulatory Visit (INDEPENDENT_AMBULATORY_CARE_PROVIDER_SITE_OTHER): Payer: Medicare Other | Admitting: Dermatology

## 2022-03-29 ENCOUNTER — Other Ambulatory Visit: Payer: Self-pay

## 2022-03-29 DIAGNOSIS — L400 Psoriasis vulgaris: Secondary | ICD-10-CM

## 2022-03-29 DIAGNOSIS — M5416 Radiculopathy, lumbar region: Secondary | ICD-10-CM

## 2022-03-29 NOTE — Progress Notes (Signed)
Patient here today for xtrac treatment of psoriasis vulgaris.    Total Surface Area: 60cm2 Total Energy: 73.20J Service Code: 79396  Dicie Beam RMA  Documentation: I have reviewed the above documentation for accuracy and completeness, and I agree with the above.  Brendolyn Patty MD

## 2022-03-29 NOTE — Telephone Encounter (Signed)
Pt called and would like to proceed with epidural order ASAP.

## 2022-03-30 ENCOUNTER — Telehealth: Payer: Self-pay

## 2022-03-31 ENCOUNTER — Ambulatory Visit
Admission: RE | Admit: 2022-03-31 | Discharge: 2022-03-31 | Disposition: A | Discharge status: Home / Self Care | Payer: Medicare Other | Source: Ambulatory Visit | Attending: Family Medicine | Admitting: Family Medicine

## 2022-03-31 ENCOUNTER — Ambulatory Visit: Payer: Medicare Other

## 2022-03-31 DIAGNOSIS — M5416 Radiculopathy, lumbar region: Secondary | ICD-10-CM

## 2022-03-31 MED ORDER — METHYLPREDNISOLONE ACETATE 40 MG/ML INJ SUSP (RADIOLOG
80.0000 mg | Freq: Once | INTRAMUSCULAR | Status: AC
  Administered 2022-03-31: 80 mg via EPIDURAL

## 2022-03-31 MED ORDER — IOPAMIDOL (ISOVUE-M 200) INJECTION 41%
1.0000 mL | Freq: Once | INTRAMUSCULAR | Status: AC
  Administered 2022-03-31: 1 mL via EPIDURAL

## 2022-03-31 NOTE — Discharge Instructions (Signed)

## 2022-04-04 NOTE — Telephone Encounter (Signed)
Offer Rifampin. Needs updated EPI if proceeds with LTBI tx. Aileen Fass, RN

## 2022-04-05 ENCOUNTER — Other Ambulatory Visit: Payer: Self-pay | Admitting: Family Medicine

## 2022-04-05 DIAGNOSIS — G8929 Other chronic pain: Secondary | ICD-10-CM

## 2022-04-11 ENCOUNTER — Ambulatory Visit (INDEPENDENT_AMBULATORY_CARE_PROVIDER_SITE_OTHER): Payer: Medicare Other | Admitting: Dermatology

## 2022-04-11 ENCOUNTER — Other Ambulatory Visit: Payer: Self-pay

## 2022-04-11 DIAGNOSIS — L409 Psoriasis, unspecified: Secondary | ICD-10-CM

## 2022-04-11 DIAGNOSIS — L400 Psoriasis vulgaris: Secondary | ICD-10-CM | POA: Diagnosis not present

## 2022-04-11 MED ORDER — PIMECROLIMUS 1 % EX CREA: TOPICAL_CREAM | Freq: Two times a day (BID) | CUTANEOUS | 3 refills | Status: DC

## 2022-04-11 NOTE — Progress Notes (Signed)
Patient here today for xtrac treatment of psoriasis vulgaris.    Total Surface Area: 48cm2 Total Energy: 58.56J Service Code: 44171   Dicie Beam RMA  Documentation: I have reviewed the above documentation for accuracy and completeness, and I agree with the above.  Brendolyn Patty MD

## 2022-04-11 NOTE — Progress Notes (Unsigned)
[place] Phone: [phone] Subjective:   Meredith [name], am serving as a scribe for Dr. Hulan [name].   I'm seeing this patient by the request  of:  Meredith Hector, MD  CC:   [id number]  Meredith [name] is a 56 y.o. female coming in with complaint of back and neck pain. Last seen on 04/05/2022. Epidural scheduled 04/18/2022. Patient states that her lower back and hips have been painful and cramping in L quad.   Medications patient has been prescribed: None  Taking:         Reviewed prior external information including notes and imaging from previsou exam, outside providers and external EMR if available.   As well as notes that were available from care everywhere and other healthcare systems.  Past medical history, social, surgical and family history all reviewed in electronic medical record.  [name] pertanent information unless stated regarding to the chief complaint.   Past Medical History:  Diagnosis Date   Anemia    Anxiety    Asthma    Chronic insomnia    Depression    Dermatitis    Gastroparesis    GERD (gastroesophageal reflux disease)    Hypertension    Hypothyroidism    Pre-diabetes    Psoriasis    Rhinitis     Allergies  Allergen Reactions   Latex Rash   Shellfish Allergy Rash    Not all the time    Amoxicillin-Pot Clavulanate     unknown     Review of Systems:  [name] headache, visual changes, nausea, vomiting, diarrhea, constipation, dizziness, abdominal pain, skin rash, fevers, chills, night sweats, weight loss, swollen lymph nodes, body aches, joint swelling, chest pain, shortness of breath, mood changes. POSITIVE muscle aches  Objective  Blood pressure 114/86, pulse (!) 119, height 5' (1.524 m), weight 138 lb (62.6 kg), SpO2 96 %.   General: [name] apparent distress alert and oriented x3 mood and affect normal, dressed appropriately.  HEENT: Pupils equal, extraocular  movements intact  Respiratory: Patient's speak in full sentences and does not appear short of breath  Cardiovascular: [name] lower extremity edema, non tender, [name] erythema  Gait cautious MSK:  Back Back exam still has significant loss of lordosis.  Significant difficulty with FABER test left greater than right.  Severe tenderness to palpation in the piriformis muscle.  Positive straight leg test still noted on the left side at 35 degrees and reports neurovascular intact distally otherwise.  Osteopathic findings  C5 flexed rotated and side bent left T3 extended rotated and side bent right inhaled rib L1 flexed rotated and side bent left L5 flexed rotated and side bent right Sacrum left on left       Assessment and Plan:  Spinal stenosis, lumbar region, with neurogenic claudication Known spinal stenosis.  Patient is using the tramadol intermittently.  Tempted osteopathic evaluation with some limited still has more discomfort on the left side of the back in the paraspinal musculature.  Patient will undergo 1 more epidural.  We will see if patient may be a candidate for radiofrequency ablation but will be leaving the state until October next week.  Prednisone given for breakthrough.  Follow-up again in 4 months when patient returns.    Nonallopathic problems  Decision today to treat with OMT was based on Physical Exam  After verbal consent patient was treated with HVLA, ME, FPR techniques in cervical, rib, thoracic, lumbar, and sacral  areas  Patient tolerated the procedure well with improvement in symptoms  Patient given exercises, stretches and lifestyle modifications  See medications in patient instructions if given  Patient will follow up in 4-8 weeks     The above documentation has been reviewed and is accurate and complete Meredith Pulley, DO         Note: This dictation was prepared with Dragon dictation along with smaller phrase technology. Any transcriptional errors  that result from this process are unintentional.

## 2022-04-11 NOTE — Progress Notes (Signed)
Patient wants to try to see if she can get [facility] RX now that she has both Medicare and Medicaid insurance. Rx sent in. aw

## 2022-04-12 ENCOUNTER — Encounter: Payer: Self-pay | Admitting: Family Medicine

## 2022-04-12 ENCOUNTER — Ambulatory Visit (INDEPENDENT_AMBULATORY_CARE_PROVIDER_SITE_OTHER): Payer: Medicare Other | Admitting: Family Medicine

## 2022-04-12 VITALS — BP 114/86 | HR 119 | Ht 60.0 in | Wt 138.0 lb

## 2022-04-12 DIAGNOSIS — M5417 Radiculopathy, lumbosacral region: Secondary | ICD-10-CM

## 2022-04-12 DIAGNOSIS — M9904 Segmental and somatic dysfunction of sacral region: Secondary | ICD-10-CM | POA: Diagnosis not present

## 2022-04-12 DIAGNOSIS — M9908 Segmental and somatic dysfunction of rib cage: Secondary | ICD-10-CM | POA: Diagnosis not present

## 2022-04-12 DIAGNOSIS — M9903 Segmental and somatic dysfunction of lumbar region: Secondary | ICD-10-CM | POA: Diagnosis not present

## 2022-04-12 DIAGNOSIS — M9901 Segmental and somatic dysfunction of cervical region: Secondary | ICD-10-CM | POA: Diagnosis not present

## 2022-04-12 DIAGNOSIS — M9902 Segmental and somatic dysfunction of thoracic region: Secondary | ICD-10-CM

## 2022-04-12 DIAGNOSIS — M5416 Radiculopathy, lumbar region: Secondary | ICD-10-CM

## 2022-04-12 DIAGNOSIS — M48062 Spinal stenosis, lumbar region with neurogenic claudication: Secondary | ICD-10-CM

## 2022-04-12 MED ORDER — METHYLPREDNISOLONE ACETATE 40 MG/ML IJ SUSP
40.0000 mg | Freq: Once | INTRAMUSCULAR | Status: AC
  Administered 2022-04-12: 40 mg via INTRAMUSCULAR

## 2022-04-12 MED ORDER — PREDNISONE 20 MG PO TABS: 40.0000 mg | ORAL_TABLET | Freq: Every day | ORAL | 0 refills | Status: DC

## 2022-04-12 MED ORDER — KETOROLAC TROMETHAMINE 30 MG/ML IJ SOLN
30.0000 mg | Freq: Once | INTRAMUSCULAR | Status: AC
  Administered 2022-04-12: 30 mg via INTRAMUSCULAR

## 2022-04-12 NOTE — Patient Instructions (Addendum)
Injections in backside today Refill prednisone '40mg'$  for 5 days See me again in October

## 2022-04-12 NOTE — Assessment & Plan Note (Signed)
Known spinal stenosis.  Patient is using the tramadol intermittently.  Tempted osteopathic evaluation with some limited still has more discomfort on the left side of the back in the paraspinal musculature.  Patient will undergo 1 more epidural.  We will see if patient may be a candidate for radiofrequency ablation but will be leaving the state until October next week.  Prednisone given for breakthrough.  Follow-up again in 4 months when patient returns.

## 2022-04-13 ENCOUNTER — Ambulatory Visit (INDEPENDENT_AMBULATORY_CARE_PROVIDER_SITE_OTHER): Payer: Medicare Other | Admitting: Dermatology

## 2022-04-13 DIAGNOSIS — L400 Psoriasis vulgaris: Secondary | ICD-10-CM | POA: Diagnosis not present

## 2022-04-13 NOTE — Progress Notes (Signed)
Patient here today for xtrac treatment of psoriasis vulgaris.    Total Surface Area: 60cm2 Total Energy: 84.18J Service Code: 92426   Dicie Beam RMA  Documentation: I have reviewed the above documentation for accuracy and completeness, and I agree with the above.  Brendolyn Patty MD

## 2022-04-14 ENCOUNTER — Ambulatory Visit: Payer: Medicare Other

## 2022-04-14 NOTE — Telephone Encounter (Signed)
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   She has already been worked up, +QFT 05/27/2021 but known previous positives as well; EPI: 08/11/2021  CXR neg 08/11/2021, HIV neg 05/26/2021.   She just needs her EPI updated, she takes a lot of meds due to psoriasis and other conditions.   If she is still interested in treatment for LTBI, please go ahead and make her an appointment wherever there is a free time slot, no rush.   Thanks so much, Dr. Lolita Lenz

## 2022-04-15 ENCOUNTER — Ambulatory Visit
Admission: RE | Admit: 2022-04-15 | Discharge: 2022-04-15 | Disposition: A | Discharge status: Home / Self Care | Payer: Medicare Other | Source: Ambulatory Visit | Attending: Family Medicine | Admitting: Family Medicine

## 2022-04-15 ENCOUNTER — Other Ambulatory Visit: Payer: Self-pay | Admitting: Family Medicine

## 2022-04-15 DIAGNOSIS — G8929 Other chronic pain: Secondary | ICD-10-CM

## 2022-04-15 MED ORDER — METHYLPREDNISOLONE ACETATE 40 MG/ML INJ SUSP (RADIOLOG
80.0000 mg | Freq: Once | INTRAMUSCULAR | Status: AC
  Administered 2022-04-15: 80 mg via EPIDURAL

## 2022-04-15 MED ORDER — IOPAMIDOL (ISOVUE-M 200) INJECTION 41%
1.0000 mL | Freq: Once | INTRAMUSCULAR | Status: AC
  Administered 2022-04-15: 1 mL via EPIDURAL

## 2022-04-18 ENCOUNTER — Ambulatory Visit (INDEPENDENT_AMBULATORY_CARE_PROVIDER_SITE_OTHER): Payer: Medicare Other | Admitting: Dermatology

## 2022-04-18 ENCOUNTER — Ambulatory Visit: Payer: Medicare Other

## 2022-04-18 ENCOUNTER — Other Ambulatory Visit: Payer: Medicare Other

## 2022-04-18 DIAGNOSIS — L400 Psoriasis vulgaris: Secondary | ICD-10-CM

## 2022-04-19 ENCOUNTER — Other Ambulatory Visit: Payer: Medicare Other

## 2022-04-19 NOTE — Telephone Encounter (Signed)
Phone call to [phone]. Left message requesting pt call Syd Newsome at [phone].   Phone call to [phone]. Left message on pt's voicemail that RN with [facility] is calling regarding TB med, have it in stock, need to know if you are still interested in taking the TB med. Please call Dez Stauffer at [phone].

## 2022-04-20 ENCOUNTER — Ambulatory Visit (INDEPENDENT_AMBULATORY_CARE_PROVIDER_SITE_OTHER): Payer: Medicare Other | Admitting: Dermatology

## 2022-04-20 DIAGNOSIS — L400 Psoriasis vulgaris: Secondary | ICD-10-CM

## 2022-04-20 NOTE — Progress Notes (Signed)
Patient here today for xtrac treatment of psoriasis vulgaris.    Total Surface Area: 48cm2 Total Energy: 88.99J Service Code: 16837   Johnsie Kindred, RMA  Documentation: I have reviewed the above documentation for accuracy and completeness, and I agree with the above.  Brendolyn Patty MD

## 2022-05-03 ENCOUNTER — Other Ambulatory Visit: Payer: Self-pay

## 2022-05-03 ENCOUNTER — Other Ambulatory Visit: Payer: Self-pay | Admitting: Family Medicine

## 2022-05-03 DIAGNOSIS — M47816 Spondylosis without myelopathy or radiculopathy, lumbar region: Secondary | ICD-10-CM

## 2022-05-27 ENCOUNTER — Ambulatory Visit
Admission: RE | Admit: 2022-05-27 | Discharge: 2022-05-27 | Disposition: A | Discharge status: Home / Self Care | Payer: Medicare Other | Source: Ambulatory Visit | Attending: Family Medicine | Admitting: Family Medicine

## 2022-05-27 ENCOUNTER — Other Ambulatory Visit: Payer: Self-pay | Admitting: Family Medicine

## 2022-05-27 DIAGNOSIS — M47816 Spondylosis without myelopathy or radiculopathy, lumbar region: Secondary | ICD-10-CM

## 2022-05-27 NOTE — Discharge Instructions (Signed)

## 2022-05-31 ENCOUNTER — Other Ambulatory Visit: Payer: Self-pay | Admitting: Family Medicine

## 2022-05-31 ENCOUNTER — Ambulatory Visit
Admission: RE | Admit: 2022-05-31 | Discharge: 2022-05-31 | Disposition: A | Discharge status: Home / Self Care | Payer: Medicare Other | Source: Ambulatory Visit | Attending: Family Medicine | Admitting: Family Medicine

## 2022-05-31 DIAGNOSIS — M47816 Spondylosis without myelopathy or radiculopathy, lumbar region: Secondary | ICD-10-CM

## 2022-05-31 MED ORDER — FENTANYL CITRATE PF 50 MCG/ML IJ SOSY
25.0000 ug | PREFILLED_SYRINGE | INTRAMUSCULAR | Status: DC | PRN
  Administered 2022-05-31: 25 ug via INTRAVENOUS
  Administered 2022-05-31: 50 ug via INTRAVENOUS

## 2022-05-31 MED ORDER — KETOROLAC TROMETHAMINE 30 MG/ML IJ SOLN
30.0000 mg | Freq: Once | INTRAMUSCULAR | Status: AC
Start: 2022-05-31 — End: 2022-05-31
  Administered 2022-05-31: 30 mg via INTRAVENOUS

## 2022-05-31 MED ORDER — MIDAZOLAM HCL 2 MG/2ML IJ SOLN
1.0000 mg | INTRAMUSCULAR | Status: DC | PRN
  Administered 2022-05-31 (×2): 0.5 mg via INTRAVENOUS
  Administered 2022-05-31: 1 mg via INTRAVENOUS

## 2022-05-31 MED ORDER — SODIUM CHLORIDE 0.9 % IV SOLN: INTRAVENOUS | Status: DC

## 2022-05-31 MED ORDER — METHYLPREDNISOLONE ACETATE 40 MG/ML INJ SUSP (RADIOLOG
80.0000 mg | Freq: Once | INTRAMUSCULAR | Status: AC
  Administered 2022-05-31: 80 mg via INTRALESIONAL

## 2022-05-31 NOTE — Discharge Instructions (Signed)
Radio Frequency Ablation Post Procedure Discharge Instructions ? ?May resume a regular diet and any medications that you routinely take (including pain medications). ?No driving day of procedure. ?Upon discharge go home and rest for at least 4 hours.  May use an ice pack as needed to injection sites on back. ?Remove bandades later, today. ? ? ? ?Please contact our office at [phone] for the following symptoms: ? ?Fever greater than 100 degrees ?Increased swelling, pain, or redness at injection site. ? ? ?Thank you for visiting [facility].  ?

## 2022-05-31 NOTE — Progress Notes (Signed)
Pt back in nursing recovery area. Pt still drowsy from procedure but will wake up when spoken to. Pt follows commands, talks in complete sentences and has no complaints at this time. Pt will be monitored until discharged by Radiologist.   

## 2022-06-14 ENCOUNTER — Encounter: Payer: Self-pay | Admitting: Family Medicine

## 2022-07-11 ENCOUNTER — Other Ambulatory Visit: Payer: Self-pay

## 2022-07-11 DIAGNOSIS — L409 Psoriasis, unspecified: Secondary | ICD-10-CM

## 2022-07-11 MED ORDER — OTEZLA 30 MG PO TABS: 30.0000 mg | ORAL_TABLET | Freq: Two times a day (BID) | ORAL | 2 refills | Status: DC

## 2022-07-27 NOTE — Progress Notes (Unsigned)
[place] Phone: [phone] Subjective:   Meredith [name], am serving as a scribe for Dr. Hulan Saas.  I'm seeing this patient by the request  of:  Adin Hector, MD  CC: Neck and back pain follow-up  XKG:[id number]  Meredith [name] is a 56 y.o. female coming in with complaint of back and neck pain. OMT on 04/12/2022. Patient states that her pain is worse than last visit.  RFA in August that did not help.  Patient continues to have pain on a daily basis.  Sometimes can wake her up at night.  Denies any fevers chills or any abnormal weight loss.  Patient has had a significant work-up by other providers as well.  Continues to have difficulty with her care and other chronic conditions including the gastroparesis.  Medications patient has been prescribed: Prednisone  Taking:         Reviewed prior external information including notes and imaging from previsou exam, outside providers and external EMR if available.   As well as notes that were available from care everywhere and other healthcare systems.  Past medical history, social, surgical and family history all reviewed in electronic medical record.  [name] pertanent information unless stated regarding to the chief complaint.   Past Medical History:  Diagnosis Date   Anemia    Anxiety    Asthma    Chronic insomnia    Depression    Dermatitis    Gastroparesis    GERD (gastroesophageal reflux disease)    Hypertension    Hypothyroidism    Pre-diabetes    Psoriasis    Rhinitis     Allergies  Allergen Reactions   Latex Rash   Shellfish Allergy Rash    Not all the time    Amoxicillin-Pot Clavulanate     unknown     Review of Systems:  [name]  visual changes, nausea, vomiting, diarrhea, constipation, dizziness, abdominal pain, skin rash, fevers, chills, night sweats, weight loss, swollen lymph nodes, , joint swelling, chest pain, shortness of breath,  mood changes. POSITIVE muscle aches, body aches, headache  Objective  Blood pressure 118/72, pulse (!) 112, height 5' (1.524 m), SpO2 98 %.   General: [name] apparent distress alert and oriented x3 mood and affect normal, dressed appropriately.  HEENT: Pupils equal, extraocular movements intact  Respiratory: Patient's speak in full sentences and does not appear short of breath  Cardiovascular: [name] lower extremity edema, non tender, [name] erythema  Gait MSK:  Back low back exam severe loss of lordosis.  Tenderness to palpation as well.  Neck exam also has some tenderness to palpation.  Patient does have findings consistent with fibromyalgia with multiple different muscular tenderness.  That seems to be out of proportion.  Osteopathic findings  C3 flexed rotated and side bent right C7 flexed rotated and side bent left T3 extended rotated and side bent right inhaled rib T9 extended rotated and side bent left L2 flexed rotated and side bent right L4 flexed rotated and side bent left Sacrum right on right     Assessment and Plan:  Lumbosacral radiculopathy at L5 Continues to have radiculopathy.  Continues to have pain that is significant overall.  Patient given injection of Toradol and Depo-Medrol.  Continue the doxycycline and the gabapentin.  Patient given tramadol for breakthrough pain.  Patient is going to be traveling for the next 6 months and given prednisone wait-and-see prescription.  Follow-up with me  when back in 6 months and will consider the possibility of referral for spinal stimulator.    Nonallopathic problems  Decision today to treat with OMT was based on Physical Exam  After verbal consent patient was treated with HVLA, ME, FPR techniques in cervical, rib, thoracic, lumbar, and sacral  areas  Patient tolerated the procedure well with improvement in symptoms  Patient given exercises, stretches and lifestyle modifications  See medications in patient instructions if  given  Patient will follow up in 4-8 weeks     The above documentation has been reviewed and is accurate and complete Lyndal Pulley, DO         Note: This dictation was prepared with Dragon dictation along with smaller phrase technology. Any transcriptional errors that result from this process are unintentional.

## 2022-07-28 ENCOUNTER — Ambulatory Visit (INDEPENDENT_AMBULATORY_CARE_PROVIDER_SITE_OTHER): Payer: Medicare Other | Admitting: Family Medicine

## 2022-07-28 VITALS — BP 118/72 | HR 112 | Ht 60.0 in

## 2022-07-28 DIAGNOSIS — M9902 Segmental and somatic dysfunction of thoracic region: Secondary | ICD-10-CM

## 2022-07-28 DIAGNOSIS — M9904 Segmental and somatic dysfunction of sacral region: Secondary | ICD-10-CM | POA: Diagnosis not present

## 2022-07-28 DIAGNOSIS — M9908 Segmental and somatic dysfunction of rib cage: Secondary | ICD-10-CM

## 2022-07-28 DIAGNOSIS — M797 Fibromyalgia: Secondary | ICD-10-CM

## 2022-07-28 DIAGNOSIS — M9901 Segmental and somatic dysfunction of cervical region: Secondary | ICD-10-CM | POA: Diagnosis not present

## 2022-07-28 DIAGNOSIS — M9903 Segmental and somatic dysfunction of lumbar region: Secondary | ICD-10-CM

## 2022-07-28 DIAGNOSIS — M2550 Pain in unspecified joint: Secondary | ICD-10-CM | POA: Diagnosis not present

## 2022-07-28 DIAGNOSIS — M5417 Radiculopathy, lumbosacral region: Secondary | ICD-10-CM

## 2022-07-28 MED ORDER — KETOROLAC TROMETHAMINE 30 MG/ML IJ SOLN
60.0000 mg | Freq: Once | INTRAMUSCULAR | Status: AC
  Administered 2022-07-28: 60 mg via INTRAMUSCULAR

## 2022-07-28 MED ORDER — METHYLPREDNISOLONE ACETATE 80 MG/ML IJ SUSP
80.0000 mg | Freq: Once | INTRAMUSCULAR | Status: AC
  Administered 2022-07-28: 80 mg via INTRAMUSCULAR

## 2022-07-28 MED ORDER — ALBENDAZOLE 200 MG PO TABS: 400.0000 mg | ORAL_TABLET | Freq: Two times a day (BID) | ORAL | 0 refills | Status: DC

## 2022-07-28 MED ORDER — TRAMADOL HCL 50 MG PO TABS: 50.0000 mg | ORAL_TABLET | Freq: Four times a day (QID) | ORAL | 0 refills | Status: DC | PRN

## 2022-07-28 MED ORDER — PREDNISONE 20 MG PO TABS: 40.0000 mg | ORAL_TABLET | Freq: Every day | ORAL | 0 refills | Status: DC

## 2022-07-28 NOTE — Patient Instructions (Signed)
See me again in 6 months Refilled Tramdol and prednisone

## 2022-07-28 NOTE — Assessment & Plan Note (Signed)
Continues to have radiculopathy.  Continues to have pain that is significant overall.  Patient given injection of Toradol and Depo-Medrol.  Continue the doxycycline and the gabapentin.  Patient given tramadol for breakthrough pain.  Patient is going to be traveling for the next 6 months and given prednisone wait-and-see prescription.  Follow-up with me when back in 6 months and will consider the possibility of referral for spinal stimulator.

## 2022-07-31 ENCOUNTER — Encounter: Payer: Self-pay | Admitting: Family Medicine

## 2022-08-01 ENCOUNTER — Other Ambulatory Visit: Payer: Self-pay

## 2022-08-01 MED ORDER — GABAPENTIN 300 MG PO CAPS: 300.0000 mg | ORAL_CAPSULE | Freq: Two times a day (BID) | ORAL | 1 refills | Status: AC

## 2022-08-01 MED ORDER — ALBENDAZOLE 200 MG PO TABS: 400.0000 mg | ORAL_TABLET | Freq: Two times a day (BID) | ORAL | 0 refills | Status: DC

## 2022-08-02 ENCOUNTER — Ambulatory Visit (INDEPENDENT_AMBULATORY_CARE_PROVIDER_SITE_OTHER): Payer: Medicare Other | Admitting: Dermatology

## 2022-08-02 DIAGNOSIS — Z79899 Other long term (current) drug therapy: Secondary | ICD-10-CM | POA: Diagnosis not present

## 2022-08-02 DIAGNOSIS — L820 Inflamed seborrheic keratosis: Secondary | ICD-10-CM | POA: Diagnosis not present

## 2022-08-02 DIAGNOSIS — L409 Psoriasis, unspecified: Secondary | ICD-10-CM

## 2022-08-02 MED ORDER — BETAMETHASONE DIPROPIONATE 0.05 % EX OINT: TOPICAL_OINTMENT | CUTANEOUS | 2 refills | Status: DC

## 2022-08-02 MED ORDER — ZORYVE 0.3 % EX CREA: TOPICAL_CREAM | CUTANEOUS | 2 refills | Status: AC

## 2022-08-02 MED ORDER — OTEZLA 30 MG PO TABS: 30.0000 mg | ORAL_TABLET | Freq: Two times a day (BID) | ORAL | 2 refills | Status: DC

## 2022-08-02 NOTE — Patient Instructions (Addendum)
Cryotherapy Aftercare  Wash gently with soap and water everyday.   Apply Vaseline and Band-Aid daily until healed.   Start Zoryve Cream (samples given) Apply to lower leg once a day.  Side effects of Otezla (apremilast) include diarrhea, nausea, headache, upper respiratory infection, depression, and weight decrease (5-10%). It should only be taken by pregnant women after a discussion regarding risks and benefits with their doctor. Goal is control of skin condition, not cure.  The use of Rutherford Nail requires long term medication management, including periodic office visits.  Topical steroids (such as triamcinolone, fluocinolone, fluocinonide, mometasone, clobetasol, halobetasol, betamethasone, hydrocortisone) can cause thinning and lightening of the skin if they are used for too long in the same area. Your physician has selected the right strength medicine for your problem and area affected on the body. Please use your medication only as directed by your physician to prevent side effects.   Recommend starting moisturizer with exfoliant (Urea, Salicylic acid, or Lactic acid) one to two times daily to help smooth rough and bumpy skin.  OTC options include Cetaphil Rough and Bumpy lotion (Urea), Eucerin Roughness Relief lotion or spot treatment cream (Urea), CeraVe SA lotion/cream for Rough and Bumpy skin (Sal Acid), Gold Bond Rough and Bumpy cream (Sal Acid), and AmLactin 12% lotion/cream (Lactic Acid).  If applying in morning, also apply sunscreen to sun-exposed areas, since these exfoliating moisturizers can increase sensitivity to sun.   Due to recent changes in healthcare laws, you may see results of your pathology and/or laboratory studies on MyChart before the doctors have had a chance to review them. We understand that in some cases there may be results that are confusing or concerning to you. Please understand that not all results are received at the same time and often the doctors may need to  interpret multiple results in order to provide you with the best plan of care or course of treatment. Therefore, we ask that you please give [place] 2 business days to thoroughly review all your results before contacting the office for clarification. Should we see a critical lab result, you will be contacted sooner.   If You Need Anything After Your Visit  If you have any questions or concerns for your doctor, please call our main line at [phone] and press option 4 to reach your doctor's medical assistant. If no one answers, please leave a voicemail as directed and we will return your call as soon as possible. Messages left after 4 pm will be answered the following business day.   You may also send [place] a message via [place]. We typically respond to MyChart messages within 1-2 business days.  For prescription refills, please ask your pharmacy to contact our office. Our fax number is [phone].  If you have an urgent issue when the clinic is closed that cannot wait until the next business day, you can page your doctor at the number below.    Please note that while we do our best to be available for urgent issues outside of office hours, we are not available 24/7.   If you have an urgent issue and are unable to reach [place], you may choose to seek medical care at your doctor's office, retail clinic, urgent care center, or emergency room.  If you have a medical emergency, please immediately call 911 or go to the emergency department.  Pager Numbers  - Dr. Nehemiah Massed: [phone]  - Dr. Laurence Ferrari: [phone]  - Dr. Nicole Kindred: [phone]  In the event of inclement weather,  please call our main line at [phone] for an update on the status of any delays or closures.  Dermatology Medication Tips: Please keep the boxes that topical medications come in in order to help keep track of the instructions about where and how to use these. Pharmacies typically print the medication instructions only on the  boxes and not directly on the medication tubes.   If your medication is too expensive, please contact our office at [phone] option 4 or send [place] a message through [place].   We are unable to tell what your co-pay for medications will be in advance as this is different depending on your insurance coverage. However, we may be able to find a substitute medication at lower cost or fill out paperwork to get insurance to cover a needed medication.   If a prior authorization is required to get your medication covered by your insurance company, please allow [place] 1-2 business days to complete this process.  Drug prices often vary depending on where the prescription is filled and some pharmacies may offer cheaper prices.  The website www.goodrx.com contains coupons for medications through different pharmacies. The prices here do not account for what the cost may be with help from insurance (it may be cheaper with your insurance), but the website can give you the price if you did not use any insurance.  - You can print the associated coupon and take it with your prescription to the pharmacy.  - You may also stop by our office during regular business hours and pick up a GoodRx coupon card.  - If you need your prescription sent electronically to a different pharmacy, notify our office through [facility] or by phone at [phone] option 4.     Si Usted Necesita Algo Despus de Su Visita  Tambin puede enviarnos un mensaje a travs de Pharmacist, community. Por lo general respondemos a los mensajes de MyChart en el transcurso de 1 a 2 das hbiles.  Para renovar recetas, por favor pida a su farmacia que se ponga en contacto con nuestra oficina. Harland Dingwall de fax es [phone].  Si tiene un asunto urgente cuando la clnica est cerrada y que no puede esperar hasta el siguiente da hbil, puede llamar/localizar a su doctor(a) al nmero que aparece a continuacin.   Por favor, tenga en cuenta que  aunque hacemos todo lo posible para estar disponibles para asuntos urgentes fuera del horario de [place], no estamos disponibles las 24 horas del da, los 7 das de la [place].   Si tiene un problema urgente y no puede comunicarse con nosotros, puede optar por buscar atencin mdica  en el consultorio de su doctor(a), en una clnica privada, en un centro de atencin urgente o en una sala de emergencias.  Si tiene Engineering geologist, por favor llame inmediatamente al 911 o vaya a la sala de emergencias.  Nmeros de bper  - Dr. Nehemiah Massed: [phone]  - Dra. Moye: [phone]  - Dra. Nicole Kindred: [phone]  En caso de inclemencias del [place], por favor llame a Johnsie Kindred principal al [phone] para una actualizacin sobre el [place] de cualquier retraso o cierre.  Consejos para la medicacin en dermatologa: Por favor, guarde las cajas en las que vienen los medicamentos de uso tpico para ayudarle a seguir las instrucciones sobre dnde y cmo usarlos. Las farmacias generalmente imprimen las instrucciones del medicamento slo en las cajas y no directamente en los tubos del [place].   Si su medicamento es [employer],  por favor, pngase en contacto con nuestra oficina llamando al [phone] y presione la opcin 4 o envenos un mensaje a travs de Pharmacist, community.   No podemos decirle cul ser su copago por los medicamentos por adelantado ya que esto es diferente dependiendo de la cobertura de su seguro. Sin embargo, es posible que podamos encontrar un medicamento sustituto a Electrical engineer un formulario para que el seguro cubra el medicamento que se considera necesario.   Si se requiere una autorizacin previa para que su compaa de seguros [place] su medicamento, por favor permtanos de 1 a 2 das hbiles para completar este proceso.  Los precios de los medicamentos varan con frecuencia dependiendo del Environmental consultant de dnde se surte la receta y alguna farmacias pueden ofrecer precios ms  baratos.  El sitio web www.goodrx.com tiene cupones para medicamentos de Airline pilot. Los precios aqu no tienen en cuenta lo que podra costar con la ayuda del seguro (puede ser ms barato con su seguro), pero el sitio web puede darle el precio si no utiliz Research scientist (physical sciences).  - Puede imprimir el cupn correspondiente y llevarlo con su receta a la farmacia.  - Tambin puede pasar por nuestra oficina durante el horario de atencin regular y Charity fundraiser una tarjeta de cupones de GoodRx.  - Si necesita que su receta se enve electrnicamente a una farmacia diferente, informe a nuestra oficina a travs de MyChart de [place] o por telfono llamando al [phone] y presione la opcin 4.

## 2022-08-02 NOTE — Progress Notes (Signed)
Follow-Up Visit   Subjective  Meredith [name] is a 56 y.o. female who presents for the following: Follow-up.  Patient presents for 5 month follow-up psoriasis of the right lower leg. She is using tacrolimus ointment and taking Otezla '30mg'$  twice daily, tolerating ok. She also has a growth on her right leg that will not go away, irritating. She is going to be leaving for the [place] in a week. She will be gone for 6 months.   Patient accompanied by husband.   The following portions of the chart were reviewed this encounter and updated as appropriate:       Review of Systems:  No other skin or systemic complaints except as noted in HPI or Assessment and Plan.  Objective  Well appearing patient in no apparent distress; mood and affect are within normal limits.  A focused examination was performed including face, leg. Relevant physical exam findings are noted in the Assessment and Plan.  Right Lateral Lower Leg Large pink scaly thin plaque of the right lateral lower leg. Face clear today  Right Pretibia Erythematous stuck-on, waxy papule    Assessment & Plan  Psoriasis Right Lateral Lower Leg  Chronic and persistent condition with duration or expected duration over one year. Condition is symptomatic / bothersome to patient. Not to goal.  Psoriasis is a chronic non-curable, but treatable genetic/hereditary disease that may have other systemic features affecting other organ systems such as joints (Psoriatic Arthritis). It is associated with an increased risk of inflammatory bowel disease, heart disease, non-alcoholic fatty liver disease, and depression.    Restart betamethasone dipropionate ointment Apply to AA lower leg QD until improved dsp 45g 2Rf. Avoid face, groin, axilla. Caution skin atrophy with long-term use.  Topical steroids (such as triamcinolone, fluocinolone, fluocinonide, mometasone, clobetasol, halobetasol, betamethasone, hydrocortisone) can cause thinning and  lightening of the skin if they are used for too long in the same area. Your physician has selected the right strength medicine for your problem and area affected on the body. Please use your medication only as directed by your physician to prevent side effects.   Continue Otezla '30mg'$  take 1 po BID Side effects of Otezla (apremilast) include diarrhea, nausea, headache, upper respiratory infection, depression, and weight decrease (5-10%). It should only be taken by pregnant women after a discussion regarding risks and benefits with their doctor. Goal is control of skin condition, not cure.  The use of Rutherford Nail requires long term medication management, including periodic office visits.  Start Zoryve Cream Apply to AA QD dsp 60g 2Rf. Samples given If not covered, then continue tacrolimus ointment Apply to AA lower leg QD/BID prn.   Roflumilast (ZORYVE) 0.3 % CREA - Right Lateral Lower Leg Apply to affected area lower leg once daily.  Related Medications tacrolimus (PROTOPIC) 0.1 % ointment Apply topically once to twice daily to affected areas right leg, face.  betamethasone dipropionate (DIPROLENE) 0.05 % ointment Apply to affected area lower leg once a day until improved, then use only on the weekends. Avoid applying to face, groin, and axilla. Use as directed. Risk of skin atrophy with long-term use reviewed.  Apremilast (OTEZLA) 30 MG TABS Take 1 tablet (30 mg total) by mouth 2 (two) times daily.  Inflamed seborrheic keratosis Right Pretibia  Vrs wart, Symptomatic, irritating, patient would like treated.  Destruction of lesion - Right Pretibia  Destruction method: cryotherapy   Informed consent: discussed and consent obtained   Lesion destroyed using liquid nitrogen: Yes   Region frozen  until ice ball extended beyond lesion: Yes   Outcome: patient tolerated procedure well with no complications   Post-procedure details: wound care instructions given   Additional details:  Prior to  procedure, discussed risks of blister formation, small wound, skin dyspigmentation, or rare scar following cryotherapy. Recommend Vaseline ointment to treated areas while healing.    Return in about 6 months (around 02/01/2023) for Psoriasis.  Documentation: I have reviewed the above documentation for accuracy and completeness, and I agree with the above.  Brendolyn Patty MD

## 2022-08-04 ENCOUNTER — Other Ambulatory Visit: Payer: Self-pay | Admitting: Internal Medicine

## 2022-08-04 DIAGNOSIS — R1084 Generalized abdominal pain: Secondary | ICD-10-CM

## 2022-08-04 DIAGNOSIS — R748 Abnormal levels of other serum enzymes: Secondary | ICD-10-CM

## 2022-08-09 ENCOUNTER — Ambulatory Visit
Admission: RE | Admit: 2022-08-09 | Discharge: 2022-08-09 | Disposition: A | Discharge status: Home / Self Care | Payer: Medicare Other | Source: Ambulatory Visit | Attending: Internal Medicine | Admitting: Internal Medicine

## 2022-08-09 DIAGNOSIS — R1084 Generalized abdominal pain: Secondary | ICD-10-CM

## 2022-08-09 DIAGNOSIS — R748 Abnormal levels of other serum enzymes: Secondary | ICD-10-CM

## 2022-09-11 ENCOUNTER — Encounter: Payer: Self-pay | Admitting: Dermatology

## 2022-10-12 ENCOUNTER — Other Ambulatory Visit: Payer: Self-pay

## 2022-10-12 DIAGNOSIS — L409 Psoriasis, unspecified: Secondary | ICD-10-CM

## 2022-10-12 MED ORDER — OTEZLA 30 MG PO TABS: 30.0000 mg | ORAL_TABLET | Freq: Two times a day (BID) | ORAL | 2 refills | Status: DC

## 2022-10-12 NOTE — Progress Notes (Signed)
Refill request faxed from [facility]. Escripted  

## 2022-12-29 ENCOUNTER — Other Ambulatory Visit: Payer: Self-pay

## 2022-12-29 DIAGNOSIS — L409 Psoriasis, unspecified: Secondary | ICD-10-CM

## 2022-12-29 MED ORDER — OTEZLA 30 MG PO TABS: 30.0000 mg | ORAL_TABLET | Freq: Two times a day (BID) | ORAL | 2 refills | Status: AC

## 2022-12-29 NOTE — Progress Notes (Signed)
Refill request faxed from [facility]

## 2023-02-13 ENCOUNTER — Other Ambulatory Visit: Payer: Self-pay | Admitting: Internal Medicine

## 2023-02-13 DIAGNOSIS — Z1231 Encounter for screening mammogram for malignant neoplasm of breast: Secondary | ICD-10-CM

## 2023-02-14 ENCOUNTER — Ambulatory Visit (INDEPENDENT_AMBULATORY_CARE_PROVIDER_SITE_OTHER): Payer: 59 | Admitting: Dermatology

## 2023-02-14 VITALS — BP 122/74 | HR 106

## 2023-02-14 DIAGNOSIS — L409 Psoriasis, unspecified: Secondary | ICD-10-CM | POA: Diagnosis not present

## 2023-02-14 DIAGNOSIS — Z7189 Other specified counseling: Secondary | ICD-10-CM | POA: Diagnosis not present

## 2023-02-14 DIAGNOSIS — Z79899 Other long term (current) drug therapy: Secondary | ICD-10-CM

## 2023-02-14 MED ORDER — TACROLIMUS 0.1 % EX OINT
TOPICAL_OINTMENT | CUTANEOUS | 2 refills | Status: DC
Start: 2023-02-14 — End: 2023-04-19

## 2023-02-14 MED ORDER — BETAMETHASONE DIPROPIONATE 0.05 % EX OINT
TOPICAL_OINTMENT | CUTANEOUS | 2 refills | Status: DC
Start: 2023-02-14 — End: 2023-05-09

## 2023-02-14 MED ORDER — VTAMA 1 % EX CREA: TOPICAL_CREAM | CUTANEOUS | 1 refills | Status: AC

## 2023-02-14 NOTE — Patient Instructions (Addendum)
D/c otezla  Start sotyktu - take 6 mg tab by mouth daily  Start vtama - use topically daily to affected areas of psoriasis at leg  Continue tacrolimus (PROTOPIC) 0.1 % ointment Apply topically once to twice daily to affected areas right leg, face.   Continue betamethasone dipropionate ointment Apply to affected areas lower leg daily until improved  . Avoid face, groin, axilla. Caution skin atrophy with long-term use.      Due to recent changes in healthcare laws, you may see results of your pathology and/or laboratory studies on MyChart before the doctors have had a chance to review them. We understand that in some cases there may be results that are confusing or concerning to you. Please understand that not all results are received at the same time and often the doctors may need to interpret multiple results in order to provide you with the best plan of care or course of treatment. Therefore, we ask that you please give [place] 2 business days to thoroughly review all your results before contacting the office for clarification. Should we see a critical lab result, you will be contacted sooner.   If You Need Anything After Your Visit  If you have any questions or concerns for your doctor, please call our main line at [phone] and press option 4 to reach your doctor's medical assistant. If no one answers, please leave a voicemail as directed and we will return your call as soon as possible. Messages left after 4 pm will be answered the following business day.   You may also send [place] a message via MyChart. We typically respond to MyChart messages within 1-2 business days.  For prescription refills, please ask your pharmacy to contact our office. Our fax number is [phone].  If you have an urgent issue when the clinic is closed that cannot wait until the next business day, you can page your doctor at the number below.    Please note that while we do our best to be available for urgent  issues outside of office hours, we are not available 24/7.   If you have an urgent issue and are unable to reach [place], you may choose to seek medical care at your doctor's office, retail clinic, urgent care center, or emergency room.  If you have a medical emergency, please immediately call 911 or go to the emergency department.  Pager Numbers  - Dr. Gwen Pounds: [phone]  - Dr. Neale Burly: [phone]  - Dr. Roseanne Reno: [phone]  In the event of inclement weather, please call our main line at [phone] for an update on the status of any delays or closures.  Dermatology Medication Tips: Please keep the boxes that topical medications come in in order to help keep track of the instructions about where and how to use these. Pharmacies typically print the medication instructions only on the boxes and not directly on the medication tubes.   If your medication is too expensive, please contact our office at [phone] option 4 or send [place] a message through MyChart.   We are unable to tell what your co-pay for medications will be in advance as this is different depending on your insurance coverage. However, we may be able to find a substitute medication at lower cost or fill out paperwork to get insurance to cover a needed medication.   If a prior authorization is required to get your medication covered by your insurance company, please allow [place] 1-2 business days to complete this process.  Drug  prices often vary depending on where the prescription is filled and some pharmacies may offer cheaper prices.  The website www.goodrx.com contains coupons for medications through different pharmacies. The prices here do not account for what the cost may be with help from insurance (it may be cheaper with your insurance), but the website can give you the price if you did not use any insurance.  - You can print the associated coupon and take it with your prescription to the pharmacy.  - You may also stop by  our office during regular business hours and pick up a GoodRx coupon card.  - If you need your prescription sent electronically to a different pharmacy, notify our office through [facility] or by phone at [phone] option 4.     Si Usted Necesita Algo Despus de Su Visita  Tambin puede enviarnos un mensaje a travs de Clinical cytogeneticist. Por lo general respondemos a los mensajes de MyChart en el transcurso de 1 a 2 das hbiles.  Para renovar recetas, por favor pida a su farmacia que se ponga en contacto con nuestra oficina. Annie Sable de fax es [place] [phone].  Si tiene un asunto urgente cuando la clnica est cerrada y que no puede esperar hasta el siguiente da hbil, puede llamar/localizar a su doctor(a) al nmero que aparece a continuacin.   Por favor, tenga en cuenta que aunque hacemos todo lo posible para estar disponibles para asuntos urgentes fuera del horario de [place], no estamos disponibles las 24 horas del da, los 7 [address]  [address] de la [place].   Si tiene un problema urgente y no puede comunicarse con nosotros, puede optar por buscar atencin mdica  en el consultorio de su doctor(a), en una clnica privada, en un centro de atencin urgente o en una sala de emergencias.  Si tiene Engineer, drilling, por favor llame inmediatamente al 911 o vaya a la sala de emergencias.  Nmeros de bper  - Dr. Gwen Pounds: [phone]  - Dra. Moye: [phone]  - Dra. Roseanne Reno: [phone]  En caso de inclemencias del [place], por favor llame a Lacy Duverney principal al [phone] para una actualizacin sobre el [place] de cualquier retraso o cierre.  Consejos para la medicacin en dermatologa: Por favor, guarde las cajas en las que vienen los medicamentos de uso tpico para ayudarle a seguir las instrucciones sobre dnde y cmo usarlos. Las farmacias generalmente imprimen las instrucciones del medicamento slo en las cajas y no directamente en los tubos del [place].   Si su  medicamento es muy caro, por favor, pngase en contacto con Rolm Gala llamando al [phone] y presione la opcin 4 o envenos un mensaje a travs de Clinical cytogeneticist.   No podemos decirle cul ser su copago por los medicamentos por adelantado ya que esto es diferente dependiendo de la cobertura de su seguro. Sin embargo, es posible que podamos encontrar un medicamento sustituto a Audiological scientist un formulario para que el seguro cubra el medicamento que se considera necesario.   Si se requiere una autorizacin previa para que su compaa de seguros [place] su medicamento, por favor permtanos de 1 a 2 das hbiles para completar [address]  Los precios de los medicamentos varan con frecuencia dependiendo del Environmental consultant de dnde se surte la receta y alguna farmacias pueden ofrecer precios ms baratos.  El sitio web www.goodrx.com tiene cupones para medicamentos de Health and safety inspector. Los precios aqu no tienen en cuenta lo que podra costar con la ayuda del seguro (puede ser ms barato  con su seguro), pero el sitio web puede darle el precio si no Field seismologist.  - Puede imprimir el cupn correspondiente y llevarlo con su receta a la farmacia.  - Tambin puede pasar por nuestra oficina durante el horario de atencin regular y Charity fundraiser una tarjeta de cupones de GoodRx.  - Si necesita que su receta se enve electrnicamente a una farmacia diferente, informe a nuestra oficina a travs de MyChart de [place] o por telfono llamando al [phone] y presione la opcin 4.

## 2023-02-14 NOTE — Progress Notes (Signed)
Follow-Up Visit   Subjective  Meredith [name] is a 57 y.o. female who presents for the following: Psoriasis 6 month follow up on psoriasis Currently using Otezla and tacrolimus ointment. Also betamethasone dipropionate ointment prn. Reports some flares at face, hand, and right lower leg.  Still very itchy. She has done [facility] in the past, but didn't complete the treatment course because of leaving the country for 6 months.  She tried the Zoryve cream but it didn't help.   The following portions of the chart were reviewed this encounter and updated as appropriate: medications, allergies, medical history  Review of Systems:  No other skin or systemic complaints except as noted in HPI or Assessment and Plan.  Objective  Well appearing patient in no apparent distress; mood and affect are within normal limits.  Areas Examined: Hands, nose, face, right lower leg  Relevant exam findings are noted in the Assessment and Plan.      Assessment & Plan   Psoriasis  Related Procedures CBC with Differential/Platelet Comprehensive metabolic panel Hepatitis B surface antibody,qualitative Hepatitis B surface antigen QuantiFERON-TB Gold Plus Hepatitis C antibody  Related Medications Roflumilast (ZORYVE) 0.3 % CREA Apply to affected area lower leg once daily.  Apremilast (OTEZLA) 30 MG TABS Take 1 tablet (30 mg total) by mouth 2 (two) times daily.  Tapinarof (VTAMA) 1 % CREA Apply topically to affected areas of legs for psoriasis  tacrolimus (PROTOPIC) 0.1 % ointment Apply topically once to twice daily to affected areas right leg, face.  betamethasone dipropionate (DIPROLENE) 0.05 % ointment Apply to affected area lower leg once a day until improved, then use only on the weekends. Avoid applying to face, groin, and axilla. Use as directed. Risk of skin atrophy with long-term use reviewed.    PSORIASIS Large scaly plaque on right lower leg and pink scaly plaque on left palm, nose,  and upper lip  6 % BSA on treatment   Chronic and persistent condition with duration or expected duration over one year. Condition is bothersome/symptomatic for patient. Currently flared.  Tried and failed otezla, elidel, clobetasol, betamethasone dipropionate ointment, tacrolimus,  enstilar foam  Counseling on psoriasis and coordination of care  psoriasis is a chronic non-curable, but treatable genetic/hereditary disease that may have other systemic features affecting other organ systems such as joints (Psoriatic Arthritis). It is associated with an increased risk of inflammatory bowel disease, heart disease, non-alcoholic fatty liver disease, and depression.  Treatments include light and laser treatments; topical medications; and systemic medications including oral and injectables.  Treatment Plan: D/c otezla  Pending labs, start Sotyktu 6 mg tab - take 6 mg tab by mouth daily 30 day sample given  Lot cmpzta Exp July 2024  Pending labs will submit rx to [facility] vtama cream - use topically daily to affected areas of psoriasis at leg Samples given  [id number]  Lot Nj6r Dec 2024  Continue tacrolimus 0.1 % ointment  Apply topically once to twice daily to affected areas right leg, face.  Continue betamethasone dipropionate ointment Apply to affected areas lower leg daily until improved  . Avoid face, groin, axilla. Caution skin atrophy with long-term use.   Will restart xtrac treatment - pending approval      Return in about 1 month (around 03/16/2023) for psoriasis .  I, Asher Muir, CMA, am acting as scribe for Willeen Niece, MD.   Documentation: I have reviewed the above documentation for accuracy and completeness, and I agree with the above.  Delice Bison  Roseanne Reno, MD

## 2023-02-16 ENCOUNTER — Encounter: Payer: Self-pay | Admitting: Dermatology

## 2023-02-16 ENCOUNTER — Telehealth: Payer: Self-pay

## 2023-02-16 NOTE — Progress Notes (Signed)
[name] [address] [place] [address] Phone: [phone] Subjective:   Meredith [name], am serving as a scribe for Dr. Antoine Primas.  I'm seeing this patient by the request  of:  [name], MD  CC: Back and neck pain follow-up  NUU:[id number]  Meredith [name] is a 57 y.o. female coming in with complaint of back and neck pain. OMT on 07/28/2022. Patient states that she is ready for this appt today.  Patient has been doing relatively well.  Has been out of the country.  No significant flares.  Had prednisone on hand if necessary.  Still takes muscle relaxer fairly regularly.         Reviewed prior external information including notes and imaging from previsou exam, outside providers and external EMR if available.   As well as notes that were available from care everywhere and other healthcare systems.  Reviewed that patient has been seen by gastroenterology recently for erosive esophagitis back in April of this year.  Also seen by primary care for her depression, essential hypertension Laboratory workup showed the patient had elevated protein levels as well as elevated liver enzymes.  Did seem to have more decreasing progression 2 days later Past medical history, social, surgical and family history all reviewed in electronic medical record.  No pertanent information unless stated regarding to the chief complaint.   Past Medical History:  Diagnosis Date   Anemia    Anxiety    Asthma    Chronic insomnia    Depression    Dermatitis    Gastroparesis    GERD (gastroesophageal reflux disease)    Hypertension    Hypothyroidism    Pre-diabetes    Psoriasis    Rhinitis     Allergies  Allergen Reactions   Latex Rash   Shellfish Allergy Rash    Not all the time    Amoxicillin-Pot Clavulanate     unknown     Review of Systems:  No headache, visual changes, nausea, vomiting, diarrhea, constipation, dizziness, abdominal pain,  skin rash, fevers, chills, night sweats, weight loss, swollen lymph nodes, body aches, joint swelling, chest pain, shortness of breath, mood changes. POSITIVE muscle aches  Objective  Blood pressure (!) 102/58, pulse 69, height 5' (1.524 m), weight 134 lb (60.8 kg), SpO2 97 %.   General: No apparent distress alert and oriented x3 mood and affect normal, dressed appropriately.  HEENT: Pupils equal, extraocular movements intact  Respiratory: Patient's speak in full sentences and does not appear short of breath  Cardiovascular: No lower extremity edema, non tender, no erythema  Low back exam does have some loss lordosis noted.  Some tenderness to palpation noted worsening pain with extension of the back  Osteopathic findings  C2 flexed rotated and side bent right C6 flexed rotated and side bent left T5 extended rotated and side bent right inhaled rib T9 extended rotated and side bent left L2 flexed rotated and side bent right L4 flexed rotated and side bent left L5 flexed rotated and side bent left Sacrum right on right    Assessment and Plan:  Spinal stenosis, lumbar region, with neurogenic claudication Patient has done relatively well with this and has responded extremely well to the facet injections and the radiofrequency ablation.  We discussed with patient to continue to monitor.  Still have some elevation in the liver enzymes that they are monitoring.  Seen other providers for this.  Discussed icing regimen and home  exercises otherwise.  Patient will be back for some time and can get back into a regular workout routine.  Patient's blood pressure is a little low and is working with another provider for this as well.  Follow-up with me again in 6 to 8 weeks otherwise    Nonallopathic problems  Decision today to treat with OMT was based on Physical Exam  After verbal consent patient was treated with HVLA, ME, FPR techniques in cervical, rib, thoracic, lumbar, and sacral   areas  Patient tolerated the procedure well with improvement in symptoms  Patient given exercises, stretches and lifestyle modifications  See medications in patient instructions if given  Patient will follow up in 4-8 weeks    The above documentation has been reviewed and is accurate and complete Judi Saa, DO          Note: This dictation was prepared with Dragon dictation along with smaller phrase technology. Any transcriptional errors that result from this process are unintentional.

## 2023-02-16 NOTE — Telephone Encounter (Signed)
Left message for patient to return my call with xtrac laser benefits. aw

## 2023-02-17 LAB — CBC WITH DIFFERENTIAL/PLATELET
Basophils Absolute: 0.1 10*3/uL (ref 0.0–0.2)
Basos: 1 %
EOS (ABSOLUTE): 0.5 10*3/uL — ABNORMAL HIGH (ref 0.0–0.4)
Eos: 5 %
Hematocrit: 42.5 % (ref 34.0–46.6)
Hemoglobin: 13.7 g/dL (ref 11.1–15.9)
Immature Grans (Abs): 0 10*3/uL (ref 0.0–0.1)
Immature Granulocytes: 0 %
Lymphocytes Absolute: 2.6 10*3/uL (ref 0.7–3.1)
Lymphs: 28 %
MCH: 26.6 pg (ref 26.6–33.0)
MCHC: 32.2 g/dL (ref 31.5–35.7)
MCV: 83 fL (ref 79–97)
Monocytes Absolute: 0.6 10*3/uL (ref 0.1–0.9)
Monocytes: 7 %
Neutrophils Absolute: 5.6 10*3/uL (ref 1.4–7.0)
Neutrophils: 59 %
Platelets: 359 10*3/uL (ref 150–450)
RBC: 5.15 x10E6/uL (ref 3.77–5.28)
RDW: 12.9 % (ref 11.7–15.4)
WBC: 9.3 10*3/uL (ref 3.4–10.8)

## 2023-02-17 LAB — COMPREHENSIVE METABOLIC PANEL
ALT: 127 IU/L — ABNORMAL HIGH (ref 0–32)
AST: 126 IU/L — ABNORMAL HIGH (ref 0–40)
Albumin/Globulin Ratio: 1.6 (ref 1.2–2.2)
Albumin: 4.8 g/dL (ref 3.8–4.9)
Alkaline Phosphatase: 137 IU/L — ABNORMAL HIGH (ref 44–121)
BUN/Creatinine Ratio: 21 (ref 9–23)
BUN: 13 mg/dL (ref 6–24)
Bilirubin Total: 0.8 mg/dL (ref 0.0–1.2)
CO2: 24 mmol/L (ref 20–29)
Calcium: 10.1 mg/dL (ref 8.7–10.2)
Chloride: 98 mmol/L (ref 96–106)
Creatinine, Ser: 0.61 mg/dL (ref 0.57–1.00)
Globulin, Total: 3 g/dL (ref 1.5–4.5)
Glucose: 229 mg/dL — ABNORMAL HIGH (ref 70–99)
Potassium: 4.7 mmol/L (ref 3.5–5.2)
Sodium: 138 mmol/L (ref 134–144)
Total Protein: 7.8 g/dL (ref 6.0–8.5)
eGFR: 104 mL/min/{1.73_m2} (ref >59)

## 2023-02-17 LAB — HEPATITIS B SURFACE ANTIBODY,QUALITATIVE: Hep B Surface Ab, Qual: REACTIVE

## 2023-02-17 LAB — QUANTIFERON-TB GOLD PLUS
QuantiFERON Mitogen Value: >10 IU/mL
QuantiFERON Nil Value: 0 IU/mL
QuantiFERON TB1 Ag Value: 0.29 IU/mL
QuantiFERON TB2 Ag Value: 0.22 IU/mL
QuantiFERON-TB Gold Plus: NEGATIVE

## 2023-02-17 LAB — HEPATITIS C ANTIBODY: Hep C Virus Ab: NONREACTIVE

## 2023-02-17 LAB — HEPATITIS B SURFACE ANTIGEN: Hepatitis B Surface Ag: NEGATIVE

## 2023-02-19 ENCOUNTER — Encounter: Payer: Self-pay | Admitting: Dermatology

## 2023-02-20 ENCOUNTER — Telehealth: Payer: Self-pay

## 2023-02-20 ENCOUNTER — Encounter: Payer: Self-pay | Admitting: Dermatology

## 2023-02-20 MED ORDER — VALACYCLOVIR HCL 500 MG PO TABS: 500.0000 mg | ORAL_TABLET | Freq: Two times a day (BID) | ORAL | 4 refills | Status: DC

## 2023-02-20 NOTE — Telephone Encounter (Signed)
-----   Message from Willeen Niece, MD sent at 02/20/2023  1:40 PM EDT ----- CBC is normal, CMP- pt has high Glucose (she is diabetic), and also liver enzymes are elevated, she needs to get this checked out by her PCP, Hep B Surface Ag reactive, c/w immunity, Hep B surface Ag is neg, Hep C neg, TB is also non reactive.  Last time it was positive so that prior test could have been a false positive.  Pt has concerns about taking Sotyktu and can discuss at f/up appt.  Continue Otezla for now.- please call patient

## 2023-02-20 NOTE — Telephone Encounter (Signed)
Discussed lab results with Francis Dowse.  He said they would prefer to stay on Otezla for now since it is cheaper for patient.  Discussed pt will need to discuss labs with PCP and Francis Dowse advised they have talked to PCP about glucose and liver enzymes.  For HSV, can send in Valtrex 500 mg PO qd/bid. #60  Take twice once daily for suppression and increase to twice daily for flares.   Valtrex sent in and discussed with Joel./sh

## 2023-02-21 ENCOUNTER — Encounter: Payer: Self-pay | Admitting: Family Medicine

## 2023-02-21 ENCOUNTER — Ambulatory Visit (INDEPENDENT_AMBULATORY_CARE_PROVIDER_SITE_OTHER): Payer: 59 | Admitting: Family Medicine

## 2023-02-21 VITALS — BP 102/58 | HR 69 | Ht 60.0 in | Wt 134.0 lb

## 2023-02-21 DIAGNOSIS — M48062 Spinal stenosis, lumbar region with neurogenic claudication: Secondary | ICD-10-CM

## 2023-02-21 DIAGNOSIS — M9901 Segmental and somatic dysfunction of cervical region: Secondary | ICD-10-CM | POA: Diagnosis not present

## 2023-02-21 DIAGNOSIS — M9903 Segmental and somatic dysfunction of lumbar region: Secondary | ICD-10-CM | POA: Diagnosis not present

## 2023-02-21 DIAGNOSIS — M9904 Segmental and somatic dysfunction of sacral region: Secondary | ICD-10-CM

## 2023-02-21 DIAGNOSIS — M9902 Segmental and somatic dysfunction of thoracic region: Secondary | ICD-10-CM

## 2023-02-21 DIAGNOSIS — M9908 Segmental and somatic dysfunction of rib cage: Secondary | ICD-10-CM | POA: Diagnosis not present

## 2023-02-21 NOTE — Assessment & Plan Note (Signed)
Patient has done relatively well with this and has responded extremely well to the facet injections and the radiofrequency ablation.  We discussed with patient to continue to monitor.  Still have some elevation in the liver enzymes that they are monitoring.  Seen other providers for this.  Discussed icing regimen and home exercises otherwise.  Patient will be back for some time and can get back into a regular workout routine.  Patient's blood pressure is a little low and is working with another provider for this as well.  Follow-up with me again in 6 to 8 weeks otherwise

## 2023-03-08 ENCOUNTER — Ambulatory Visit
Admission: RE | Admit: 2023-03-08 | Discharge: 2023-03-08 | Disposition: A | Discharge status: Home / Self Care | Payer: 59 | Source: Ambulatory Visit | Attending: Internal Medicine | Admitting: Internal Medicine

## 2023-03-08 DIAGNOSIS — Z1231 Encounter for screening mammogram for malignant neoplasm of breast: Secondary | ICD-10-CM | POA: Diagnosis present

## 2023-03-13 ENCOUNTER — Other Ambulatory Visit: Payer: Self-pay | Admitting: Internal Medicine

## 2023-03-13 DIAGNOSIS — R928 Other abnormal and inconclusive findings on diagnostic imaging of breast: Secondary | ICD-10-CM

## 2023-03-14 ENCOUNTER — Other Ambulatory Visit: Payer: Self-pay

## 2023-03-14 MED ORDER — ACYCLOVIR 5 % EX OINT: 1.0000 | TOPICAL_OINTMENT | CUTANEOUS | 3 refills | Status: AC

## 2023-03-15 ENCOUNTER — Ambulatory Visit
Admission: RE | Admit: 2023-03-15 | Discharge: 2023-03-15 | Disposition: A | Discharge status: Home / Self Care | Payer: 59 | Source: Ambulatory Visit | Attending: Internal Medicine | Admitting: Internal Medicine

## 2023-03-15 DIAGNOSIS — R928 Other abnormal and inconclusive findings on diagnostic imaging of breast: Secondary | ICD-10-CM

## 2023-03-21 ENCOUNTER — Ambulatory Visit (INDEPENDENT_AMBULATORY_CARE_PROVIDER_SITE_OTHER): Payer: 59 | Admitting: Dermatology

## 2023-03-21 VITALS — BP 117/73 | HR 99

## 2023-03-21 DIAGNOSIS — L820 Inflamed seborrheic keratosis: Secondary | ICD-10-CM

## 2023-03-21 DIAGNOSIS — B009 Herpesviral infection, unspecified: Secondary | ICD-10-CM | POA: Diagnosis not present

## 2023-03-21 DIAGNOSIS — Z79899 Other long term (current) drug therapy: Secondary | ICD-10-CM

## 2023-03-21 DIAGNOSIS — L4050 Arthropathic psoriasis, unspecified: Secondary | ICD-10-CM | POA: Diagnosis not present

## 2023-03-21 DIAGNOSIS — L810 Postinflammatory hyperpigmentation: Secondary | ICD-10-CM

## 2023-03-21 DIAGNOSIS — L409 Psoriasis, unspecified: Secondary | ICD-10-CM | POA: Diagnosis not present

## 2023-03-21 MED ORDER — HYDROQUINONE 4 % EX CREA: TOPICAL_CREAM | Freq: Two times a day (BID) | CUTANEOUS | 2 refills | Status: AC

## 2023-03-21 NOTE — Patient Instructions (Addendum)
For Psoriasis Recommend continue otezla and start cosentyx      For cold sores  Start hydroquinone cream - apply once or twice daily to any darks spots daily. Can use for up to 3 months at a time. Then take a break for 3 months. Before restarting  Continue Valtrex 500 mg as prescribed for flares. Use as directed  Cold Sore  A cold sore, also called a fever blister, is a small, fluid-filled sore that forms inside of the mouth or on the lips, gums, nose, chin, or cheeks. Cold sores can spread to other parts of the body, such as the eyes, fingers, or genitals. Cold sores can spread from person to person (are contagious) until the sores crust over completely. Most cold sores go away within 2 weeks. What are the causes? Cold sores are caused by a virus (herpes simplex virus type 1, HSV-1). The virus can spread from person to person through close contact, such as through: Kissing. Touching the affected area. Sharing personal items such as lip balm, razors, a drinking glass, or eating utensils. What increases the risk? Being tired, stressed, or sick. Having your period (menstruating). Being pregnant. Taking certain medicines. Being out in cold weather or getting too much sun. What are the signs or symptoms? Symptoms of a cold sore go through different stages: Tingling, itching, or burning is felt 1-2 days before the cold sore appears. Fluid-filled blisters appear on the lips, inside the mouth, on the nose, or on the cheeks. The blisters start to ooze clear fluid. The blisters dry up, and a yellow crust appears in their place. The crust falls off. In some cases, other symptoms can develop along with cold sores. These can include: Fever. Sore throat. Headache. Muscle aches. Swollen neck glands. How is this treated? There is no cure for cold sores or the virus that causes them. There is also no vaccine to prevent the virus. Most cold sores go away on their own without treatment  within 2 weeks. Your doctor may prescribe medicines to: Help with pain. Keep the virus from growing. Help you heal faster. Medicines may be in the form of creams, gels, pills, or a shot. Follow these instructions at home: Medicines Take or apply over-the-counter and prescription medicines only as told by your doctor. Use a cotton-tip swab to apply creams or gels to your sores. Ask your doctor if you can take lysine supplements. These may help with healing. Sore care  Do not touch the sores or pick the scabs. Wash your hands often with soap and water for at least 20 seconds. Do not touch your eyes without washing your hands first. Keep the sores clean and dry. If told, put ice on the sores. To do this: Put ice in a plastic bag. Place a towel between your skin and the bag. Leave the ice on for 20 minutes, 2-3 times a day. Take off the ice if your skin turns bright red. This is very important. If you cannot feel pain, heat, or cold, you have a greater risk of damage to the area. Eating and drinking Eat a soft, bland diet. Avoid eating hot, cold, or salty foods. These can hurt your mouth. Use a straw if it hurts to drink out of a glass. Eat foods that have a lot of lysine in them. These include meat, fish, and dairy products. Avoid sugary foods, chocolates, nuts, and grains. These foods have a high amount of a substance (arginine) that can cause  the virus to grow. Lifestyle Do not kiss, have oral sex, or share personal items until your sores heal. Stress, poor sleep, and being out in the sun can trigger a cold sore. Make sure you: Do activities that help you relax, such as deep breathing exercises or meditation. Get enough sleep. Put sunscreen on your lips before you go out in the sun. Contact a doctor if: You have symptoms for more than 2 weeks. You have pus coming from the sores. You have redness that is spreading. You have pain or irritation in your eye. You get sores on your  genitals. Your sores do not heal within 2 weeks. You get cold sores often. Get help right away if: You have a fever and your symptoms suddenly get worse. You have a headache and confusion. You have tiredness (fatigue). You do not want to eat as much as normal (loss of appetite). You have a stiff neck or are sensitive to light. Summary A cold sore is a small, fluid-filled sore that forms inside of the mouth or on the lips, gums, nose, chin, or cheeks. Cold sores can spread from person to person (are contagious) until the sores crust over completely. Most cold sores go away within 2 weeks. Wash your hands often. Do not touch your eyes without washing your hands first. Do not kiss, have oral sex, or share personal items until your sores heal. Contact a doctor if your sores do not heal within 2 weeks. This information is not intended to replace advice given to you by your health care provider. Make sure you discuss any questions you have with your health care provider. Document Revised: 07/21/2021 Document Reviewed: 07/21/2021 Elsevier Patient Education  2024 Elsevier Inc.  Recommend over the counter Curad mediplast patch - apply and leave on   healthcare laws, you may see results of your pathology and/or laboratory studies on MyChart before the doctors have had a chance to review them. We understand that in some cases there may be results that are confusing or concerning to you. Please understand that not all results are received at the same time and often the doctors may need to interpret multiple results in order to provide you with the best plan of care or course of treatment. Therefore, we ask that you please give [place] 2 business days to thoroughly review all your results before contacting the office for clarification. Should we see a critical lab result, you will be contacted sooner.   If You Need Anything After Your Visit  If you have any questions or concerns for your doctor, please call  our main line at [phone] and press option 4 to reach your doctor's medical assistant. If no one answers, please leave a voicemail as directed and we will return your call as soon as possible. Messages left after 4 pm will be answered the following business day.   You may also send [place] a message via MyChart. We typically respond to MyChart messages within 1-2 business days.  For prescription refills, please ask your pharmacy to contact our office. Our fax number is [phone].  If you have an urgent issue when the clinic is closed that cannot wait until the next business day, you can page your doctor at the number below.    Please note that while we do our best to be available for urgent issues outside of office hours, we are not available 24/7.   If you have an urgent issue and are unable to reach [place],  you may choose to seek medical care at your doctor's office, retail clinic, urgent care center, or emergency room.  If you have a medical emergency, please immediately call 911 or go to the emergency department.  Pager Numbers  - Dr. Gwen Pounds: [phone]  - Dr. Neale Burly: [phone]  - Dr. Roseanne Reno: [phone]  In the event of inclement weather, please call our main line at [phone] for an update on the status of any delays or closures.  Dermatology Medication Tips: Please keep the boxes that topical medications come in in order to help keep track of the instructions about where and how to use these. Pharmacies typically print the medication instructions only on the boxes and not directly on the medication tubes.   If your medication is too expensive, please contact our office at [phone] option 4 or send [place] a message through MyChart.   We are unable to tell what your co-pay for medications will be in advance as this is different depending on your insurance coverage. However, we may be able to find a substitute medication at lower cost or fill out paperwork to get insurance to  cover a needed medication.   If a prior authorization is required to get your medication covered by your insurance company, please allow [place] 1-2 business days to complete this process.  Drug prices often vary depending on where the prescription is filled and some pharmacies may offer cheaper prices.  The website www.goodrx.com contains coupons for medications through different pharmacies. The prices here do not account for what the cost may be with help from insurance (it may be cheaper with your insurance), but the website can give you the price if you did not use any insurance.  - You can print the associated coupon and take it with your prescription to the pharmacy.  - You may also stop by our office during regular business hours and pick up a GoodRx coupon card.  - If you need your prescription sent electronically to a different pharmacy, notify our office through [facility] or by phone at [phone] option 4.     Si Usted Necesita Algo Despus de Su Visita  Tambin puede enviarnos un mensaje a travs de Clinical cytogeneticist. Por lo general respondemos a los mensajes de MyChart en el transcurso de 1 a 2 das hbiles.  Para renovar recetas, por favor pida a su farmacia que se ponga en contacto con nuestra oficina. Annie Sable de fax es [place] [phone].  Si tiene un asunto urgente cuando la clnica est cerrada y que no puede esperar hasta el siguiente da hbil, puede llamar/localizar a su doctor(a) al nmero que aparece a continuacin.   Por favor, tenga en cuenta que aunque hacemos todo lo posible para estar disponibles para asuntos urgentes fuera del horario de [place], no estamos disponibles las 24 horas del da, los 7 [address]  [address] de la [place].   Si tiene un problema urgente y no puede comunicarse con nosotros, puede optar por buscar atencin mdica  en el consultorio de su doctor(a), en una clnica privada, en un centro de atencin urgente o en una sala de emergencias.  Si tiene Wellsite geologist, por favor llame inmediatamente al 911 o vaya a la sala de emergencias.  Nmeros de bper  - Dr. Gwen Pounds: [phone]  - Dra. Moye: [phone]  - Dra. Roseanne Reno: [phone]  En caso de inclemencias del [place], por favor llame a Lacy Duverney principal al [phone] para una actualizacin sobre el estado de cualquier [place] o  cierre.  Consejos para la medicacin en dermatologa: Por favor, guarde las cajas en las que vienen los medicamentos de uso tpico para ayudarle a seguir las instrucciones sobre dnde y cmo usarlos. Las farmacias generalmente imprimen las instrucciones del medicamento slo en las cajas y no directamente en los tubos del [place].   Si su medicamento es muy caro, por favor, pngase en contacto con Rolm Gala llamando al [phone] y presione la opcin 4 o envenos un mensaje a travs de Clinical cytogeneticist.   No podemos decirle cul ser su copago por los medicamentos por adelantado ya que esto es diferente dependiendo de la cobertura de su seguro. Sin embargo, es posible que podamos encontrar un medicamento sustituto a Audiological scientist un formulario para que el seguro cubra el medicamento que se considera necesario.   Si se requiere una autorizacin previa para que su compaa de seguros [place] su medicamento, por favor permtanos de 1 a 2 das hbiles para completar [address]  Los precios de los medicamentos varan con frecuencia dependiendo del Environmental consultant de dnde se surte la receta y alguna farmacias pueden ofrecer precios ms baratos.  El sitio web www.goodrx.com tiene cupones para medicamentos de Health and safety inspector. Los precios aqu no tienen en cuenta lo que podra costar con la ayuda del seguro (puede ser ms barato con su seguro), pero el sitio web puede darle el precio si no utiliz Tourist information centre manager.  - Puede imprimir el cupn correspondiente y llevarlo con su receta a la farmacia.  - Tambin puede pasar por nuestra oficina durante el  horario de atencin regular y Education officer, museum una tarjeta de cupones de GoodRx.  - Si necesita que su receta se enve electrnicamente a una farmacia diferente, informe a nuestra oficina a travs de MyChart de [place] o por telfono llamando al [phone] y presione la opcin 4.

## 2023-03-21 NOTE — Progress Notes (Unsigned)
Follow-Up Visit   Subjective  Meredith [name] is a 57 y.o. female who presents for the following: Patient here for 1 month follow up on psoriasis. With husband. Reports rheumatologist prescribed Cosentyx to treat PsA. Patient has not started yet and wanted to discuss whether she should wait since she is taking [place]. Flared at right lower leg. Pt was not approved for S[place]. Husband reports they are waiting to start Cosentyx.  She wants to go back on the [place] when she is in the [place] for 6 months because she won't be able to get the Cosentyx, and she has extra [place]..     Patient also reports had a recent cold sore outbreak at buttocks. Is concerned with scarring at area and would like to discuss any type of scar treatment she could use. She takes Valtrex 500 mg po qd and bid with flares   The following portions of the chart were reviewed this encounter and updated as appropriate: medications, allergies, medical history  Review of Systems:  No other skin or systemic complaints except as noted in HPI or Assessment and Plan.  Objective  Well appearing patient in no apparent distress; mood and affect are within normal limits.  Areas Examined: Right leg, face, buttocks  Relevant exam findings are noted in the Assessment and Plan.      Assessment & Plan   Psoriasis  Related Medications Roflumilast (ZORYVE) 0.3 % CREA Apply to affected area lower leg once daily.  Apremilast (OTEZLA) 30 MG TABS Take 1 tablet (30 mg total) by mouth 2 (two) times daily.  Tapinarof (VTAMA) 1 % CREA Apply topically to affected areas of legs for psoriasis  tacrolimus (PROTOPIC) 0.1 % ointment Apply topically once to twice daily to affected areas right leg, face.  betamethasone dipropionate (DIPROLENE) 0.05 % ointment Apply to affected area lower leg once a day until improved, then use only on the weekends. Avoid applying to face, groin, and axilla. Use as directed. Risk of skin atrophy  with long-term use reviewed.  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  Chronic and persistent condition with duration or expected duration over one year. Condition is symptomatic/ bothersome to patient. Not currently at goal.  Patient has joint pain   Treatment Plan: Cosentyx prescribed by rheumatologist for PsA Recommend starting now: Cosentyx 300 mg or 2 injection 0 1 2 3 4  weeks and then 2 injections (300 mg) every 4 weeks  Can overlap with Otezla for next month, then D/C.  May restart Otezla while out of the country for 6 months   Counseling and coordination of care for severe psoriasis on systemic treatment  psoriasis - severe on systemic treatment.  Psoriasis is a chronic non-curable, but treatable genetic/hereditary disease that may have other systemic features affecting other organ systems such as joints (Psoriatic Arthritis).  It is linked with heart disease, inflammatory bowel disease, non-alcoholic fatty liver disease, and depression. Significant skin psoriasis and/or psoriatic arthritis may have significant symptoms and affects activities of daily activity and often  benefits from systemic treatments.  These systemic treatments have some potential side effects including immunosuppression and require pre-treatment laboratory screening and periodic laboratory monitoring and periodic in person evaluation and monitoring by the attending dermatologist physician (long term medication management).    Return in about 4 months  (around 07/22/2023).  I, Asher Muir, CMA, am acting as scribe for Willeen Niece, MD.   Documentation: I have reviewed the above documentation for accuracy and completeness, and I agree with the above.  Willeen Niece, MD

## 2023-04-06 NOTE — Progress Notes (Signed)
[name] [address] [place] [address] Phone: [phone] Subjective:   Meredith [name], am serving as a scribe for Dr. Antoine Primas.  I'm seeing this patient by the request  of:  Meredith Ferrier, MD  CC: Neck and back pain follow-up  [id number]  [name] is a 57 y.o. female coming in with complaint of back and neck pain. OMT on 02/21/2023. Patient states that her pain feels worse recently. Pain occurs more often than not.  Patient feels like she is just not feeling good at the moment.  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  Recently has been talking to her rheumatologist.  Having difficulty with her thyroid  Medications patient has been prescribed:   Taking:         Reviewed prior external information including notes and imaging from previsou exam, outside providers and external EMR if available.   As well as notes that were available from care everywhere and other healthcare systems.  Patient has been seen by rheumatology recently and they continue to work on her Cosentyx.  Continuing to have difficulty with her thyroid as well.  Last TSH was 9.5 seems to have been increasing over the course of the last several months.  Past medical history, social, surgical and family history all reviewed in electronic medical record.  No pertanent information unless stated regarding to the chief complaint.   Past Medical History:  Diagnosis Date   Anemia    Anxiety    Asthma    Chronic insomnia    Depression    Dermatitis    Gastroparesis    GERD (gastroesophageal reflux disease)    Hypertension    Hypothyroidism    Pre-diabetes    Psoriasis    Rhinitis     Allergies  Allergen Reactions   Latex Rash   Shellfish Allergy Rash    Not all the time    Amoxicillin-Pot Clavulanate     unknown     Review of Systems:  No headache, visual changes, nausea, vomiting, diarrhea, constipation, dizziness,skin rash, fevers, chills,  night sweats, weight loss, swollen lymph nodes,, joint swelling, chest pain, shortness of breath, mood changes. POSITIVE muscle aches, body aches, abdominal pain  Objective  Blood pressure 130/88, pulse (!) 104, height 5' (1.524 m), weight 148 lb (67.1 kg), SpO2 98 %.   General: No apparent distress alert and oriented x3 mood and affect normal, dressed appropriately.  HEENT: Pupils equal, extraocular movements intact do not see any specific jaundice with questionable dry eyes Respiratory: Patient's speak in full sentences and does not appear short of breath  Cardiovascular: 1+ lower extremity edema, non tender, no erythema  Patient does have what appears to be more ascites noted of the stomach.  Possible hepatomegaly also felt under the right border.  Significant tightness in the lower back noted.  Patient does have tightness around the sacroiliac joints bilaterally.  Osteopathic findings  C2 flexed rotated and side bent right C6 flexed rotated and side bent left T3 extended rotated and side bent right inhaled rib T9 extended rotated and side bent left L2 flexed rotated and side bent right Sacrum right on right    Assessment and Plan:  Abnormal LFTs Continues to have elevation of the LFTs.  Known fatty liver.  Discussed the possibility of further imaging with MRI.  Would like to check with her primary care provider she stated.  They will check and see if anything else needs to  be done.  Concern with patient having some ascites.  Discussed if worsening symptoms, signs of jaundice, seek medical attention immediately.  Patient will be out of town starting in a couple weeks for the summer and we will see her back again in September for follow-up on her back.    Nonallopathic problems  Decision today to treat with OMT was based on Physical Exam  After verbal consent patient was treated with , ME, FPR techniques in cervical, rib, thoracic, lumbar, and sacral  areas avoided HVLA secondary to  patient's ascites.  Patient tolerated the procedure well with improvement in symptoms  Patient given exercises, stretches and lifestyle modifications  See medications in patient instructions if given  Patient will follow up in 4-8 weeks     The above documentation has been reviewed and is accurate and complete Judi Saa, DO         Note: This dictation was prepared with Dragon dictation along with smaller phrase technology. Any transcriptional errors that result from this process are unintentional.

## 2023-04-07 ENCOUNTER — Ambulatory Visit (INDEPENDENT_AMBULATORY_CARE_PROVIDER_SITE_OTHER): Payer: 59 | Admitting: Family Medicine

## 2023-04-07 ENCOUNTER — Encounter: Payer: Self-pay | Admitting: Family Medicine

## 2023-04-07 VITALS — BP 130/88 | HR 104 | Ht 60.0 in | Wt 148.0 lb

## 2023-04-07 DIAGNOSIS — M9903 Segmental and somatic dysfunction of lumbar region: Secondary | ICD-10-CM | POA: Diagnosis not present

## 2023-04-07 DIAGNOSIS — M9904 Segmental and somatic dysfunction of sacral region: Secondary | ICD-10-CM

## 2023-04-07 DIAGNOSIS — M9908 Segmental and somatic dysfunction of rib cage: Secondary | ICD-10-CM

## 2023-04-07 DIAGNOSIS — M9901 Segmental and somatic dysfunction of cervical region: Secondary | ICD-10-CM

## 2023-04-07 DIAGNOSIS — M9902 Segmental and somatic dysfunction of thoracic region: Secondary | ICD-10-CM | POA: Diagnosis not present

## 2023-04-07 DIAGNOSIS — R7989 Other specified abnormal findings of blood chemistry: Secondary | ICD-10-CM | POA: Diagnosis not present

## 2023-04-07 NOTE — Assessment & Plan Note (Signed)
Continues to have elevation of the LFTs.  Known fatty liver.  Discussed the possibility of further imaging with MRI.  Would like to check with her primary care provider she stated.  They will check and see if anything else needs to be done.  Concern with patient having some ascites.  Discussed if worsening symptoms, signs of jaundice, seek medical attention immediately.  Patient will be out of town starting in a couple weeks for the summer and we will see her back again in September for follow-up on her back.

## 2023-04-12 ENCOUNTER — Other Ambulatory Visit: Payer: Self-pay | Admitting: Family Medicine

## 2023-04-12 DIAGNOSIS — G8929 Other chronic pain: Secondary | ICD-10-CM

## 2023-04-17 ENCOUNTER — Ambulatory Visit
Admission: RE | Admit: 2023-04-17 | Discharge: 2023-04-17 | Disposition: A | Discharge status: Home / Self Care | Payer: 59 | Source: Ambulatory Visit | Attending: Family Medicine | Admitting: Family Medicine

## 2023-04-17 DIAGNOSIS — G8929 Other chronic pain: Secondary | ICD-10-CM

## 2023-04-19 ENCOUNTER — Other Ambulatory Visit: Payer: Self-pay

## 2023-04-19 DIAGNOSIS — L409 Psoriasis, unspecified: Secondary | ICD-10-CM

## 2023-04-19 MED ORDER — TACROLIMUS 0.1 % EX OINT
TOPICAL_OINTMENT | CUTANEOUS | 2 refills | Status: AC
Start: 2023-04-19 — End: ?

## 2023-04-19 NOTE — Progress Notes (Signed)
Refill request  faxed from [facility]. Escripted

## 2023-05-09 ENCOUNTER — Other Ambulatory Visit: Payer: Self-pay

## 2023-05-09 DIAGNOSIS — L409 Psoriasis, unspecified: Secondary | ICD-10-CM

## 2023-05-09 MED ORDER — BETAMETHASONE DIPROPIONATE 0.05 % EX OINT
TOPICAL_OINTMENT | CUTANEOUS | 2 refills | Status: DC
Start: 2023-05-09 — End: 2023-07-05

## 2023-05-09 MED ORDER — VALACYCLOVIR HCL 500 MG PO TABS: 500.0000 mg | ORAL_TABLET | Freq: Two times a day (BID) | ORAL | 0 refills | Status: DC

## 2023-05-09 NOTE — Progress Notes (Signed)
 Refill request  faxed from [facility]. Escripted

## 2023-05-15 ENCOUNTER — Encounter: Payer: Self-pay | Admitting: Dermatology

## 2023-06-27 NOTE — Progress Notes (Signed)
[name] [address] [place] [address] Phone: [phone] Subjective:   INadine Counts, am serving as a scribe for Dr. Antoine Primas.  I'm seeing this patient by the request  of:  [name], MD  CC: Back and neck pain follow-up  [id number]  Meredith [name] is a 57 y.o. female coming in with complaint of back and neck pain. OMT on 04/07/2023. Patient states same per usual. No new concerns.  Has been having more pain overall though.  Has been quite sometime since we have done any manipulation.  States that it is just sore all the time.  Recently had a long car ride that seemed to cause some exacerbation as well.          Reviewed prior external information including notes and imaging from previsou exam, outside providers and external EMR if available.   As well as notes that were available from care everywhere and other healthcare systems.  Past medical history, social, surgical and family history all reviewed in electronic medical record.  No pertanent information unless stated regarding to the chief complaint.   Past Medical History:  Diagnosis Date   Anemia    Anxiety    Asthma    Chronic insomnia    Depression    Dermatitis    Gastroparesis    GERD (gastroesophageal reflux disease)    Hypertension    Hypothyroidism    Pre-diabetes    Psoriasis    Rhinitis     Allergies  Allergen Reactions   Latex Rash   Shellfish Allergy Rash    Not all the time    Amoxicillin-Pot Clavulanate     unknown     Review of Systems:  No headache, visual changes, nausea, vomiting, diarrhea, constipation, dizziness, abdominal pain, skin rash, fevers, chills, night sweats, weight loss, swollen lymph nodes, body aches,nges. POSITIVE muscle aches, body aches  Objective  Blood pressure 124/74, pulse (!) 108, height 5' (1.524 m), weight 139 lb (63 kg), SpO2 97%.   General: No apparent distress alert and oriented x3 mood and affect  normal, dressed appropriately.  HEENT: Pupils equal, extraocular movements intact  Respiratory: Patient's speak in full sentences and does not appear short of breath  Cardiovascular: No lower extremity edema, non tender, no erythema  Back exam does have some loss lordosis noted.  Some tenderness to palpation in the paraspinal musculature.  Osteopathic findings  C3 flexed rotated and side bent right T3 extended rotated and side bent right inhaled rib T9 extended rotated and side bent left L2 flexed rotated and side bent right L5 flexed rotated and side bent right Sacrum right on right    Assessment and Plan:  No problem-specific Assessment & Plan notes found for this encounter.    Nonallopathic problems  Decision today to treat with OMT was based on Physical Exam  After verbal consent patient was treated with HVLA, ME, FPR techniques in cervical, rib, thoracic, lumbar, and sacral  areas  Patient tolerated the procedure well with improvement in symptoms  Patient given exercises, stretches and lifestyle modifications  See medications in patient instructions if given  Patient will follow up in 4-8 weeks     The above documentation has been reviewed and is accurate and complete Judi Saa, DO         Note: This dictation was prepared with Dragon dictation along with smaller phrase technology. Any transcriptional errors that result from this process are unintentional.

## 2023-07-04 ENCOUNTER — Encounter: Payer: Self-pay | Admitting: Family Medicine

## 2023-07-04 ENCOUNTER — Ambulatory Visit (INDEPENDENT_AMBULATORY_CARE_PROVIDER_SITE_OTHER): Payer: 59 | Admitting: Family Medicine

## 2023-07-04 ENCOUNTER — Other Ambulatory Visit: Payer: Self-pay | Admitting: Dermatology

## 2023-07-04 VITALS — BP 124/74 | HR 108 | Ht 60.0 in | Wt 139.0 lb

## 2023-07-04 DIAGNOSIS — M9904 Segmental and somatic dysfunction of sacral region: Secondary | ICD-10-CM | POA: Diagnosis not present

## 2023-07-04 DIAGNOSIS — M9908 Segmental and somatic dysfunction of rib cage: Secondary | ICD-10-CM | POA: Diagnosis not present

## 2023-07-04 DIAGNOSIS — M9901 Segmental and somatic dysfunction of cervical region: Secondary | ICD-10-CM

## 2023-07-04 DIAGNOSIS — M9903 Segmental and somatic dysfunction of lumbar region: Secondary | ICD-10-CM | POA: Diagnosis not present

## 2023-07-04 DIAGNOSIS — L409 Psoriasis, unspecified: Secondary | ICD-10-CM

## 2023-07-04 DIAGNOSIS — M9902 Segmental and somatic dysfunction of thoracic region: Secondary | ICD-10-CM | POA: Diagnosis not present

## 2023-07-04 DIAGNOSIS — M48062 Spinal stenosis, lumbar region with neurogenic claudication: Secondary | ICD-10-CM | POA: Diagnosis not present

## 2023-07-04 NOTE — Patient Instructions (Signed)
See you at next appointment Good to see you!

## 2023-07-04 NOTE — Assessment & Plan Note (Signed)
Chronic problem with exacerbation noted.  Discussed icing regimen and home exercises.  Discussed avoiding certain activities.  Follow-up again in 6 to 8 weeks otherwise.

## 2023-07-17 ENCOUNTER — Other Ambulatory Visit: Payer: Self-pay | Admitting: Dermatology

## 2023-07-24 ENCOUNTER — Ambulatory Visit: Payer: 59

## 2023-07-24 DIAGNOSIS — K2100 Gastro-esophageal reflux disease with esophagitis, without bleeding: Secondary | ICD-10-CM | POA: Diagnosis not present

## 2023-07-24 DIAGNOSIS — R1313 Dysphagia, pharyngeal phase: Secondary | ICD-10-CM | POA: Diagnosis not present

## 2023-07-24 DIAGNOSIS — K3189 Other diseases of stomach and duodenum: Secondary | ICD-10-CM | POA: Diagnosis present

## 2023-07-25 ENCOUNTER — Ambulatory Visit: Payer: 59 | Admitting: Dermatology

## 2023-07-30 ENCOUNTER — Other Ambulatory Visit: Payer: Self-pay | Admitting: Dermatology

## 2023-07-30 DIAGNOSIS — L409 Psoriasis, unspecified: Secondary | ICD-10-CM

## 2023-08-16 NOTE — Progress Notes (Unsigned)
[name] [address] [place] [address] Phone: [phone] Subjective:   INadine [name], am serving as a scribe for Dr. Antoine Primas.  I'm seeing this patient by the request  of:  [name], MD  CC: Back and neck pain follow-up  [id number]  Meredith [name] is a 57 y.o. female coming in with complaint of back and neck pain. OMT on 07/04/2023. Patient states same per usual. No new concerns.  Continues to have chronic gait difficulties.  Tenderness to palpation all over.  Patient will be traveling and is hoping to get another refill for the tramadol.  Trying to stay active.  Has seen multiple other doctors recently as well          Reviewed prior external information including notes and imaging from previsou exam, outside providers and external EMR if available.   As well as notes that were available from care everywhere and other healthcare systems.  Past medical history, social, surgical and family history all reviewed in electronic medical record.  No pertanent information unless stated regarding to the chief complaint.   Past Medical History:  Diagnosis Date   Anemia    Anxiety    Asthma    Chronic insomnia    Depression    Dermatitis    Gastroparesis    GERD (gastroesophageal reflux disease)    Hypertension    Hypothyroidism    Pre-diabetes    Psoriasis    Rhinitis     Allergies  Allergen Reactions   Latex Rash   Shellfish Allergy Rash    Not all the time    Amoxicillin-Pot Clavulanate     unknown     Review of Systems:  No headache, visual changes, nausea, vomiting, diarrhea, constipation, dizziness, abdominal pain, skin rash, fevers, chills, night sweats, weight loss, swollen lymph nodes, body aches, joint swelling, chest pain, shortness of breath, mood changes. POSITIVE muscle aches  Objective  Blood pressure 124/82, pulse 90, height 5' (1.524 m), weight 139 lb (63 kg), SpO2 98%.   General: No apparent  distress alert and oriented x3 mood and affect normal, dressed appropriately.  HEENT: Pupils equal, extraocular movements intact  Respiratory: Patient's speak in full sentences and does not appear short of breath  Cardiovascular: No lower extremity edema, non tender, no erythema  Significant loss of lordosis noted.  Some tenderness to palpation in the paraspinal musculature.  Patient does have some limited range of motion in all planes.  Tightness diffusely even in the parascapular area and up to the occipital area.  Osteopathic findings  C3 flexed rotated and side bent right C7 flexed rotated and side bent left T3 extended rotated and side bent right inhaled rib T8 extended rotated and side bent left L3 flexed rotated and side bent right L4 flexed rotated side bent left  Sacrum right on right     Assessment and Plan:  Spinal stenosis, lumbar region, with neurogenic claudication Low back does have some loss of lordosis noted.  We will continue to monitor.  Does have severe spinal stenosis.  Tramadol given again for patient is going to be out of the country for a long duration.  Patient will follow-up with me again in 6 months    Nonallopathic problems  Decision today to treat with OMT was based on Physical Exam  After verbal consent patient was treated with HVLA, ME, FPR techniques in cervical, rib, thoracic, lumbar, and sacral  areas  Patient tolerated  the procedure well with improvement in symptoms  Patient given exercises, stretches and lifestyle modifications  See medications in patient instructions if given  Patient will follow up in 4-8 weeks    The above documentation has been reviewed and is accurate and complete Judi Saa, DO          Note: This dictation was prepared with Dragon dictation along with smaller phrase technology. Any transcriptional errors that result from this process are unintentional.

## 2023-08-17 ENCOUNTER — Ambulatory Visit: Payer: 59 | Admitting: Family Medicine

## 2023-08-17 ENCOUNTER — Encounter: Payer: Self-pay | Admitting: Family Medicine

## 2023-08-17 VITALS — BP 124/82 | HR 90 | Ht 60.0 in | Wt 139.0 lb

## 2023-08-17 DIAGNOSIS — M9908 Segmental and somatic dysfunction of rib cage: Secondary | ICD-10-CM | POA: Diagnosis not present

## 2023-08-17 DIAGNOSIS — M48062 Spinal stenosis, lumbar region with neurogenic claudication: Secondary | ICD-10-CM | POA: Diagnosis not present

## 2023-08-17 DIAGNOSIS — M9904 Segmental and somatic dysfunction of sacral region: Secondary | ICD-10-CM

## 2023-08-17 DIAGNOSIS — M9901 Segmental and somatic dysfunction of cervical region: Secondary | ICD-10-CM

## 2023-08-17 DIAGNOSIS — M9903 Segmental and somatic dysfunction of lumbar region: Secondary | ICD-10-CM

## 2023-08-17 DIAGNOSIS — M9902 Segmental and somatic dysfunction of thoracic region: Secondary | ICD-10-CM

## 2023-08-17 MED ORDER — TRAMADOL HCL 50 MG PO TABS
50.0000 mg | ORAL_TABLET | Freq: Two times a day (BID) | ORAL | 0 refills | Status: AC | PRN
Start: 2023-08-17 — End: 2023-08-22

## 2023-08-17 NOTE — Assessment & Plan Note (Signed)
Low back does have some loss of lordosis noted.  We will continue to monitor.  Does have severe spinal stenosis.  Tramadol given again for patient is going to be out of the country for a long duration.  Patient will follow-up with me again in 6 months

## 2023-08-17 NOTE — Patient Instructions (Signed)
Good to see you! Could consider neosporin or Aquaphor or Vit E See you again when you get back

## 2023-10-13 ENCOUNTER — Other Ambulatory Visit: Payer: Self-pay | Admitting: Dermatology

## 2023-10-13 DIAGNOSIS — L409 Psoriasis, unspecified: Secondary | ICD-10-CM

## 2024-01-15 ENCOUNTER — Ambulatory Visit: Payer: 59 | Admitting: Dermatology

## 2024-02-19 ENCOUNTER — Ambulatory Visit (INDEPENDENT_AMBULATORY_CARE_PROVIDER_SITE_OTHER): Payer: 59 | Admitting: Dermatology

## 2024-02-19 ENCOUNTER — Encounter: Payer: Self-pay | Admitting: Dermatology

## 2024-02-19 ENCOUNTER — Other Ambulatory Visit: Payer: Self-pay | Admitting: Internal Medicine

## 2024-02-19 DIAGNOSIS — B001 Herpesviral vesicular dermatitis: Secondary | ICD-10-CM

## 2024-02-19 DIAGNOSIS — Z7189 Other specified counseling: Secondary | ICD-10-CM | POA: Diagnosis not present

## 2024-02-19 DIAGNOSIS — Z79899 Other long term (current) drug therapy: Secondary | ICD-10-CM

## 2024-02-19 DIAGNOSIS — B009 Herpesviral infection, unspecified: Secondary | ICD-10-CM

## 2024-02-19 DIAGNOSIS — L4050 Arthropathic psoriasis, unspecified: Secondary | ICD-10-CM | POA: Diagnosis not present

## 2024-02-19 DIAGNOSIS — L409 Psoriasis, unspecified: Secondary | ICD-10-CM

## 2024-02-19 DIAGNOSIS — Z1231 Encounter for screening mammogram for malignant neoplasm of breast: Secondary | ICD-10-CM

## 2024-02-19 NOTE — Progress Notes (Signed)
 Follow-Up Visit   Subjective  Meredith [name] is a 58 y.o. female who presents for the following: Psoriasis. On Cosentyx for PsA, prescribed by rheumatologist. Started Cosentyx after last visit here. But then stopped when went to [place] 6 months, returned [place]  last Thursday. Took Otezla  while in [place] Using Tacrolimus  ointment.    The following portions of the chart were reviewed this encounter and updated as appropriate: medications, allergies, medical history  Review of Systems:  No other skin or systemic complaints except as noted in HPI or Assessment and Plan.  Objective  Well appearing patient in no apparent distress; mood and affect are within normal limits.  Areas Examined: Right leg  Relevant exam findings are noted in the Assessment and Plan.      Assessment & Plan     PSORIASIS on systemic treatment, with PsA Large pink plaque with mild scale at right lower leg. 3% BSA.  Chronic and persistent condition with duration or expected duration over one year. Condition is improving with treatment but not currently at goal. Currently on Otezla , but will be switching back to Cosentyx   Counseling and coordination of care for severe psoriasis on systemic treatment  Psoriasis - severe on systemic treatment.  Psoriasis is a chronic non-curable, but treatable genetic/hereditary disease that may have other systemic features affecting other organ systems such as joints (Psoriatic Arthritis).  It is linked with heart disease, inflammatory bowel disease, non-alcoholic fatty liver disease, and depression. Significant skin psoriasis and/or psoriatic arthritis may have significant symptoms and affects activities of daily activity and often benefits from systemic treatments.  These systemic treatments have some potential side effects including immunosuppression and require pre-treatment laboratory screening and periodic laboratory monitoring and periodic in person evaluation  and monitoring by the attending dermatologist physician (long term medication management).   Patient has joint pain in hands, hips, back.   Treatment Plan: Re-start: Cosentyx 300 mg 2 injections (300 mg) every 4 weeks as per rheumatology Can overlap with Otezla  for next month, then D/C when out of medication.  Use Tacrolimus  ointment every day on weekdays.  Use Betamethasone  every day on weekends.  Avoid applying to face, groin, and axilla. Use as directed. Long-term use can cause thinning of the skin.  Topical steroids (such as triamcinolone, fluocinolone, fluocinonide, mometasone , clobetasol, halobetasol, betamethasone , hydrocortisone) can cause thinning and lightening of the skin if they are used for too long in the same area. Your physician has selected the right strength medicine for your problem and area affected on the body. Please use your medication only as directed by your physician to prevent side effects.    Reviewed risks of biologics including immunosuppression, infections, injection site reaction, and failure to improve condition. Goal is control of skin condition, not cure.  Some older biologics such as Humira and Enbrel may slightly increase risk of malignancy and may worsen congestive heart failure.  Taltz and Cosentyx may cause inflammatory bowel disease to flare. The use of biologics requires long term medication management, including periodic office visits and monitoring of blood work.   Long term medication management.  Patient is using long term (months to years) prescription medication  to control their dermatologic condition.  These medications require periodic monitoring to evaluate for efficacy and side effects and may require periodic laboratory monitoring.   HERPESVIRAL INFECTION (COLD SORES) Exam  hyperpigmented macules and patches at left buttock, some with mild scale/crust macules, consistent with resolving HSV,  at right buttock   Chronic  and persistent  condition with duration or expected duration over one year. Condition is improving with treatment but not currently at goal.   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  It is infectious and can be spread from person to person by direct contact.  It is not curable, but is treatable with topical and oral medication.  Treatment Plan Continue Valtrex  500 mg PO qd at suppressive dose to prevent flare and increase to bid with flares. Call for refills when needed.   Recommend using Tacrolimus  ointment twice a day to affected areas on buttocks prn flares.  Continue Acyclovir  ointment 4-5 times daily to affected areas on buttocks.   Call for appointment for viral PCR test when flares again to see if HSV 1 or HSV 2  Return in about 1 year (around 02/18/2025) for Psoriasis Follow Up, sooner as needed for flares.   I, Jill Parcell, CMA, am acting as scribe for Artemio Larry, MD.   Documentation: I have reviewed the above documentation for accuracy and completeness, and I agree with the above.  Artemio Larry, MD

## 2024-02-19 NOTE — Patient Instructions (Addendum)
 Re-start: Cosentyx 300 mg 2 injections (300 mg) every 4 weeks  Can overlap with Otezla  for next month, then D/C.  Use Tacrolimus  ointment every day on weekdays.  Use Betamethasone  every day on weekends.  Avoid applying to face, groin, and axilla. Use as directed. Long-term use can cause thinning of the skin.  Topical steroids (such as triamcinolone, fluocinolone, fluocinonide, mometasone , clobetasol, halobetasol, betamethasone , hydrocortisone) can cause thinning and lightening of the skin if they are used for too long in the same area. Your physician has selected the right strength medicine for your problem and area affected on the body. Please use your medication only as directed by your physician to prevent side effects.    Reviewed risks of biologics including immunosuppression, infections, injection site reaction, and failure to improve condition. Goal is control of skin condition, not cure.  Some older biologics such as Humira and Enbrel may slightly increase risk of malignancy and may worsen congestive heart failure.  Taltz and Cosentyx may cause inflammatory bowel disease to flare. The use of biologics requires long term medication management, including periodic office visits and monitoring of blood work.    Continue Valtrex  500 mg PO qd at suppressive dose to prevent flare and increase to bid with flares   Recommend using Tacrolimus  ointment twice a day to affected areas on buttocks.  Continue Acyclovir  ointment 4-5 times daily to affected areas on buttocks    Gentle Skin Care Guide  1. Bathe no more than once a day.  2. Avoid bathing in hot water  3. Use a mild soap like Dove, Vanicream, Cetaphil, CeraVe. Can use Lever 2000 or Cetaphil antibacterial soap  4. Use soap only where you need it. On most days, use it under your arms, between your legs, and on your feet. Let the water rinse other areas unless visibly dirty.  5. When you get out of the bath/shower, use a towel to  gently blot your skin dry, don't rub it.  6. While your skin is still a little damp, apply a moisturizing cream such as Vanicream, CeraVe, Cetaphil, Eucerin, Sarna lotion or plain Vaseline Jelly. For hands apply Neutrogena [place] Hand Cream or Excipial Hand Cream.  7. Reapply moisturizer any time you start to itch or feel dry.  8. Sometimes using free and clear laundry detergents can be helpful. Fabric softener sheets should be avoided. Downy Free & Gentle liquid, or any liquid fabric softener that is free of dyes and perfumes, it acceptable to use  9. If your doctor has given you prescription creams you may apply moisturizers over them       Due to recent changes in healthcare laws, you may see results of your pathology and/or laboratory studies on MyChart before the doctors have had a chance to review them. We understand that in some cases there may be results that are confusing or concerning to you. Please understand that not all results are received at the same time and often the doctors may need to interpret multiple results in order to provide you with the best plan of care or course of treatment. Therefore, we ask that you please give us  2 business days to thoroughly review all your results before contacting the office for clarification. Should we see a critical lab result, you will be contacted sooner.   If You Need Anything After Your Visit  If you have any questions or concerns for your doctor, please call our main line at [phone] and press option 4 to reach  your doctor's medical assistant. If no one answers, please leave a voicemail as directed and we will return your call as soon as possible. Messages left after 4 pm will be answered the following business day.   You may also send us  a message via MyChart. We typically respond to MyChart messages within 1-2 business days.  For prescription refills, please ask your pharmacy to contact our office. Our fax number is  [phone].  If you have an urgent issue when the clinic is closed that cannot wait until the next business day, you can page your doctor at the number below.    Please note that while we do our best to be available for urgent issues outside of office hours, we are not available 24/7.   If you have an urgent issue and are unable to reach us , you may choose to seek medical care at your doctor's office, retail clinic, urgent care center, or emergency room.  If you have a medical emergency, please immediately call 911 or go to the emergency department.  Pager Numbers  - Dr. Bary Likes: [phone]  - Dr. Annette Barters: [phone]  - Dr. Felipe Horton: [phone]   In the event of inclement weather, please call our main line at [phone] for an update on the status of any delays or closures.  Dermatology Medication Tips: Please keep the boxes that topical medications come in in order to help keep track of the instructions about where and how to use these. Pharmacies typically print the medication instructions only on the boxes and not directly on the medication tubes.   If your medication is too expensive, please contact our office at [phone] option 4 or send us  a message through MyChart.   We are unable to tell what your co-pay for medications will be in advance as this is different depending on your insurance coverage. However, we may be able to find a substitute medication at lower cost or fill out paperwork to get insurance to cover a needed medication.   If a prior authorization is required to get your medication covered by your insurance company, please allow us  1-2 business days to complete this process.  Drug prices often vary depending on where the prescription is filled and some pharmacies may offer cheaper prices.  The website www.goodrx.com contains coupons for medications through different pharmacies. The prices here do not account for what the cost may be with help from  insurance (it may be cheaper with your insurance), but the website can give you the price if you did not use any insurance.  - You can print the associated coupon and take it with your prescription to the pharmacy.  - You may also stop by our office during regular business hours and pick up a GoodRx coupon card.  - If you need your prescription sent electronically to a different pharmacy, notify our office through [facility] or by phone at [phone] option 4.     Si Usted Necesita Algo Despus de Su Visita  Tambin puede enviarnos un mensaje a travs de Clinical cytogeneticist. Por lo general respondemos a los mensajes de MyChart en el transcurso de 1 a 2 das hbiles.  Para renovar recetas, por favor pida a su farmacia que se ponga en contacto con nuestra oficina. Franz Jacks de fax es [place] [phone].  Si tiene un asunto urgente cuando la clnica est cerrada y que no puede esperar hasta el siguiente da hbil, puede llamar/localizar a su doctor(a) al nmero que aparece a continuacin.  Por favor, tenga en cuenta que aunque hacemos todo lo posible para estar disponibles para asuntos urgentes fuera del horario de [place], no estamos disponibles las 24 horas del da, los 7 [address]  [address] de la [place].   Si tiene un problema urgente y no puede comunicarse con nosotros, puede optar por buscar atencin mdica  en el consultorio de su doctor(a), en una clnica privada, en un centro de atencin urgente o en una sala de emergencias.  Si tiene Engineer, drilling, por favor llame inmediatamente al 911 o vaya a la sala de emergencias.  Nmeros de bper  - Dr. Bary Likes: [phone]  - Dra. Annette Barters: [phone]  - Dr. Felipe Horton: [phone]   En caso de inclemencias del tiempo, por favor llame a Lajuan Pila principal al [phone] para una actualizacin sobre el [place] de cualquier retraso o cierre.  Consejos para la medicacin en dermatologa: Por favor, guarde las cajas en las que vienen los  medicamentos de uso tpico para ayudarle a seguir las instrucciones sobre dnde y cmo usarlos. Las farmacias generalmente imprimen las instrucciones del medicamento slo en las cajas y no directamente en los tubos del [place].   Si su medicamento es muy caro, por favor, pngase en contacto con Bettyjane Brunet llamando al [phone] y presione la opcin 4 o envenos un mensaje a travs de Clinical cytogeneticist.   No podemos decirle cul ser su copago por los medicamentos por adelantado ya que esto es diferente dependiendo de la cobertura de su seguro. Sin embargo, es posible que podamos encontrar un medicamento sustituto a Audiological scientist un formulario para que el seguro cubra el medicamento que se considera necesario.   Si se requiere una autorizacin previa para que su compaa de seguros [place] su medicamento, por favor permtanos de 1 a 2 das hbiles para completar este proceso.  Los precios de los medicamentos varan con frecuencia dependiendo del Environmental consultant de dnde se surte la receta y alguna farmacias pueden ofrecer precios ms baratos.  El sitio web www.goodrx.com tiene cupones para medicamentos de Health and safety inspector. Los precios aqu no tienen en cuenta lo que podra costar con la ayuda del seguro (puede ser ms barato con su seguro), pero el sitio web puede darle el precio si no utiliz Tourist information centre manager.  - Puede imprimir el cupn correspondiente y llevarlo con su receta a la farmacia.  - Tambin puede pasar por nuestra oficina durante el horario de atencin regular y Education officer, museum una tarjeta de cupones de GoodRx.  - Si necesita que su receta se enve electrnicamente a una farmacia diferente, informe a nuestra oficina a travs de MyChart de [place] o por telfono llamando al [phone] y presione la opcin 4.

## 2024-02-21 ENCOUNTER — Encounter: Payer: Self-pay | Admitting: Internal Medicine

## 2024-02-21 ENCOUNTER — Inpatient Hospital Stay

## 2024-02-21 ENCOUNTER — Telehealth: Payer: Self-pay

## 2024-02-21 ENCOUNTER — Inpatient Hospital Stay: Attending: Internal Medicine | Admitting: Internal Medicine

## 2024-02-21 VITALS — BP 144/82 | HR 89 | Temp 98.6°F | Resp 12 | Ht 60.0 in | Wt 131.8 lb

## 2024-02-21 DIAGNOSIS — Z79899 Other long term (current) drug therapy: Secondary | ICD-10-CM | POA: Insufficient documentation

## 2024-02-21 DIAGNOSIS — D509 Iron deficiency anemia, unspecified: Secondary | ICD-10-CM | POA: Diagnosis not present

## 2024-02-21 DIAGNOSIS — Z800 Family history of malignant neoplasm of digestive organs: Secondary | ICD-10-CM | POA: Insufficient documentation

## 2024-02-21 DIAGNOSIS — R233 Spontaneous ecchymoses: Secondary | ICD-10-CM

## 2024-02-21 DIAGNOSIS — Z803 Family history of malignant neoplasm of breast: Secondary | ICD-10-CM | POA: Diagnosis not present

## 2024-02-21 DIAGNOSIS — R899 Unspecified abnormal finding in specimens from other organs, systems and tissues: Secondary | ICD-10-CM | POA: Insufficient documentation

## 2024-02-21 LAB — CBC WITH DIFFERENTIAL/PLATELET
Abs Immature Granulocytes: 0.04 10*3/uL (ref 0.00–0.07)
Basophils Absolute: 0.1 10*3/uL (ref 0.0–0.1)
Basophils Relative: 1 %
Eosinophils Absolute: 0.7 10*3/uL — ABNORMAL HIGH (ref 0.0–0.5)
Eosinophils Relative: 7 %
HCT: 40.2 % (ref 36.0–46.0)
Hemoglobin: 13.2 g/dL (ref 12.0–15.0)
Immature Granulocytes: 0 %
Lymphocytes Relative: 33 %
Lymphs Abs: 3.3 10*3/uL (ref 0.7–4.0)
MCH: 26.7 pg (ref 26.0–34.0)
MCHC: 32.8 g/dL (ref 30.0–36.0)
MCV: 81.2 fL (ref 80.0–100.0)
Monocytes Absolute: 0.8 10*3/uL (ref 0.1–1.0)
Monocytes Relative: 8 %
Neutro Abs: 5.1 10*3/uL (ref 1.7–7.7)
Neutrophils Relative %: 51 %
Platelets: 376 10*3/uL (ref 150–400)
RBC: 4.95 MIL/uL (ref 3.87–5.11)
RDW: 13.2 % (ref 11.5–15.5)
WBC: 10 10*3/uL (ref 4.0–10.5)
nRBC: 0 % (ref 0.0–0.2)

## 2024-02-21 LAB — IRON AND TIBC
Iron: 110 ug/dL (ref 28–170)
Saturation Ratios: 23 % (ref 10.4–31.8)
TIBC: 470 ug/dL — ABNORMAL HIGH (ref 250–450)
UIBC: 360 ug/dL

## 2024-02-21 LAB — PLATELET FUNCTION ASSAY
Collagen / ADP: >300 s — ABNORMAL HIGH (ref 0–118)
Collagen / Epinephrine: >300 s — ABNORMAL HIGH (ref 0–193)

## 2024-02-21 LAB — COMPREHENSIVE METABOLIC PANEL WITH GFR
ALT: 63 U/L — ABNORMAL HIGH (ref 0–44)
AST: 85 U/L — ABNORMAL HIGH (ref 15–41)
Albumin: 4.3 g/dL (ref 3.5–5.0)
Alkaline Phosphatase: 83 U/L (ref 38–126)
Anion gap: 11 (ref 5–15)
BUN: 11 mg/dL (ref 6–20)
CO2: 24 mmol/L (ref 22–32)
Calcium: 9.7 mg/dL (ref 8.9–10.3)
Chloride: 100 mmol/L (ref 98–111)
Creatinine, Ser: 0.68 mg/dL (ref 0.44–1.00)
GFR, Estimated: >60 mL/min (ref >60)
Glucose, Bld: 142 mg/dL — ABNORMAL HIGH (ref 70–99)
Potassium: 3.8 mmol/L (ref 3.5–5.1)
Sodium: 135 mmol/L (ref 135–145)
Total Bilirubin: 1.2 mg/dL (ref 0.0–1.2)
Total Protein: 8.4 g/dL — ABNORMAL HIGH (ref 6.5–8.1)

## 2024-02-21 LAB — LACTATE DEHYDROGENASE: LDH: 208 U/L — ABNORMAL HIGH (ref 98–192)

## 2024-02-21 LAB — PROTIME-INR
INR: 1.1 (ref 0.8–1.2)
Prothrombin Time: 14.5 s (ref 11.4–15.2)

## 2024-02-21 LAB — FERRITIN: Ferritin: 47 ng/mL (ref 11–307)

## 2024-02-21 LAB — APTT: aPTT: 36 s (ref 24–36)

## 2024-02-21 NOTE — Progress Notes (Signed)
 [facility] CONSULT NOTE  Patient Care Team: Melchor Spoon, MD as PCP - General (Internal Medicine) Gwyn Leos, MD as Consulting Physician (Hematology and Oncology)  CHIEF COMPLAINTS/PURPOSE OF CONSULTATION: Easy bruising   HEMATOLOGY HISTORY:  # EASY BRUISING:   HISTORY OF PRESENTING ILLNESS: Patient ambulating-independently. Accompanied by family.   Meredith [name] 58 y.o.  female has been referred to us  for further evaluation/work-up for easy bruising/bleeding.   Patient has been having nose bleeds for years. 1.5 months ago had a nose bleed/clots that wouldn't stop while in the [place] With clots from throat.  S/p Transexmic acid in IV.    She has had a cough for 2-3 years, sees Dr. Marvie Sloop ENT at 3:15 today  Prior evaluation with hematology: None Gum bleed: always.  Blood in stools: None Blood in urine: None Vaginal bleeding: None Liver disease: fatty liver.  Platelets disease: None Wisdom teeth extraction- 6-7 years ago- tooth extraction-no increased bleeding Other surgeries: back surgeries/ gall bladder- appendix [lap]- no increased bleeding Family History of bleeding: 2 sons- epistaxis; along with 2 of grand children.  Medications: NSIADs/ prednisone / Asprin/ NOAKs: none; on Fish oil  MEDICAL HISTORY:  Past Medical History:  Diagnosis Date   Anemia    Anxiety    Asthma    Chronic insomnia    Depression    Dermatitis    Gastroparesis    GERD (gastroesophageal reflux disease)    Hypertension    Hypothyroidism    Pre-diabetes    Psoriasis    Rhinitis     SURGICAL HISTORY: Past Surgical History:  Procedure Laterality Date   APPENDECTOMY     CHOLECYSTECTOMY  2017   EYE SURGERY     LUMBAR LAMINECTOMY/DECOMPRESSION MICRODISCECTOMY N/A 04/22/2021   Procedure: LAMINECTOMY AND FORAMINOTOMY Lumbar three-four, Lumbar four-five;  Surgeon: Van Gelinas, MD;  Location: [facility] OR;  Service: Neurosurgery;  Laterality: N/A;     SOCIAL HISTORY: Social History   Socioeconomic History   Marital status: Widowed    Spouse name: Not on file   Number of children: Not on file   Years of education: Not on file   Highest education level: Not on file  Occupational History   Occupation: unemployed   Tobacco Use   Smoking status: Never   Smokeless tobacco: Never  Vaping Use   Vaping status: Never Used  Substance and Sexual Activity   Alcohol use: No   Drug use: No   Sexual activity: Yes    Comment: post-menopausa;  Other Topics Concern   Not on file  Social History Narrative   Not on file   Social Drivers of Health   Financial Resource Strain: Low Risk  (07/17/2023)   Received from [facility]   Overall Financial Resource Strain (CARDIA)    Difficulty of Paying Living Expenses: Not hard at all  Food Insecurity: No Food Insecurity (02/21/2024)   Hunger Vital Sign    Worried About Running Out of Food in the Last Year: Never true    Ran Out of Food in the Last Year: Never true  Transportation Needs: No Transportation Needs (02/21/2024)   PRAPARE - Administrator, Civil Service (Medical): No    Lack of Transportation (Non-Medical): No  Physical Activity: Not on file  Stress: Stress Concern Present (12/24/2019)   [employer] of Occupational Health - Occupational Stress Questionnaire    Feeling of Stress : Rather much  Social Connections: Unknown (03/05/2022)  Received from [facility]   Social Network    Social Network: Not on file  Intimate Partner Violence: Not At Risk (02/21/2024)   Humiliation, Afraid, Rape, and Kick questionnaire    Fear of Current or Ex-Partner: No    Emotionally Abused: No    Physically Abused: No    Sexually Abused: No    FAMILY HISTORY: Family History  Problem Relation Age of Onset   Diabetes Mother    Heart attack Mother    Hyperlipidemia Father    Hypertension Father    Eczema Father    Breast cancer Sister     Colon cancer Son     ALLERGIES:  is allergic to latex, shellfish allergy, and amoxicillin-pot clavulanate.  MEDICATIONS:  Current Outpatient Medications  Medication Sig Dispense Refill   acyclovir  ointment (ZOVIRAX ) 5 % Apply 1 Application topically as directed. Apply to sores on buttocks 4-5 times daily prn flares 30 g 3   albuterol (VENTOLIN HFA) 108 (90 Base) MCG/ACT inhaler Inhale 1 puff into the lungs every 4 (four) hours as needed for wheezing or shortness of breath.     Apremilast  (OTEZLA ) 30 MG TABS Take 1 tablet (30 mg total) by mouth 2 (two) times daily. 60 tablet 2   betamethasone  dipropionate (DIPROLENE ) 0.05 % ointment APPLY TO AREA ON LOWER LEGS ONCE DAILY UNTIL IMPROVED THEN ONLY  ON WEEKENDS. AVOID FACE, GROIN,  AXILLA RISK OF SKIN ATROPHY WITH LONG TERM USE 135 g 0   carboxymethylcellulose (REFRESH PLUS) 0.5 % SOLN Place 1 drop into both eyes daily as needed (dry eyes).     celecoxib (CELEBREX) 200 MG capsule Take 200 mg by mouth 2 (two) times daily.     cetirizine (ZYRTEC) 10 MG tablet Take 10 mg by mouth daily as needed for allergies.     Cholecalciferol 50 MCG (2000 UT) CAPS Take 2,000 Units by mouth in the morning and at bedtime.     cyanocobalamin  (,VITAMIN B-12,) 1000 MCG/ML injection INJECT 1ML INTO THE MUSCLE EVERY 30 DAYS (Patient taking differently: Inject 1,000 mcg into the muscle every 30 (thirty) days. INJECT INTO THE MUSCLE EVERY 30 DAYS) 1 mL 3   cyclobenzaprine  (FLEXERIL ) 10 MG tablet Take 1 tablet (10 mg total) by mouth 3 (three) times daily as needed for muscle spasms. 40 tablet 0   DEXILANT 60 MG capsule Take 60 mg by mouth daily.     DULoxetine  (CYMBALTA ) 60 MG capsule Take 60 mg by mouth 2 (two) times daily.     famotidine (PEPCID) 20 MG tablet Take 20 mg by mouth at bedtime.     gabapentin  (NEURONTIN ) 300 MG capsule Take 1 capsule (300 mg total) by mouth 2 (two) times daily. 180 capsule 1   hydrochlorothiazide (MICROZIDE) 12.5 MG capsule Take 12.5 mg  by mouth daily.     hydroquinone  4 % cream Apply topically 2 (two) times daily. Use for up to 3 months on any dark spots. After 3 months take a break for 3 months. 28.35 g 2   levothyroxine  (SYNTHROID ) 125 MCG tablet Take 125 mcg by mouth daily before breakfast.     losartan  (COZAAR ) 25 MG tablet Take 1 tablet (25 mg total) by mouth at bedtime. 90 tablet 1   Multiple Vitamin (MULTIVITAMIN ADULT PO) Take 1 tablet by mouth daily.     nortriptyline (PAMELOR) 10 MG capsule Take 40 mg by mouth at bedtime.     Omega-3 1000 MG CAPS Take 1,000 mg by mouth  daily.     polyethylene glycol powder (GLYCOLAX/MIRALAX) 17 GM/SCOOP powder Take 17 g by mouth daily.     Roflumilast  (ZORYVE ) 0.3 % CREA Apply to affected area lower leg once daily. 60 g 2   SYRINGE-NEEDLE, DISP, 3 ML (B-D 3CC LUER-LOK SYR 25GX1") 25G X 1" 3 ML MISC Use 1 syringe with needle monthly with B-1 2injections 1 each 6   tacrolimus  (PROTOPIC ) 0.1 % ointment Apply topically once to twice daily to affected areas right leg, face. 100 g 2   Tapinarof  (VTAMA ) 1 % CREA Apply topically to affected areas of legs for psoriasis 60 g 1   valACYclovir  (VALTREX ) 500 MG tablet TAKE 1 TABLET BY MOUTH TWICE  DAILY 180 tablet 3   vitamin C (ASCORBIC ACID) 500 MG tablet Take 500 mg by mouth daily.     vitamin E 180 MG (400 UNITS) capsule Take 400 Units by mouth daily.     zolpidem  (AMBIEN  CR) 12.5 MG CR tablet Take 1 tablet (12.5 mg total) by mouth at bedtime as needed. (Patient taking differently: Take 12.5 mg by mouth at bedtime.) 30 tablet 1   albendazole  (ALBENZA ) 200 MG tablet Take 2 tablets (400 mg total) by mouth 2 (two) times daily. (Patient not taking: Reported on 02/21/2024) 20 tablet 0   traMADol  (ULTRAM ) 50 MG tablet Take 1 tablet (50 mg total) by mouth every 6 (six) hours as needed. (Patient not taking: Reported on 02/21/2024) 20 tablet 0   No current facility-administered medications for this visit.    PHYSICAL EXAMINATION:   Vitals:    02/21/24 1407  BP: (!) 144/82  Pulse: 89  Resp: 12  Temp: 98.6 F (37 C)  SpO2: 99%   Filed Weights   02/21/24 1407  Weight: 131 lb 12.8 oz (59.8 kg)    Physical Exam Vitals and nursing note reviewed.  HENT:     Head: Normocephalic and atraumatic.     Mouth/Throat:     Pharynx: Oropharynx is clear.  Eyes:     Extraocular Movements: Extraocular movements intact.     Pupils: Pupils are equal, round, and reactive to light.  Cardiovascular:     Rate and Rhythm: Normal rate and regular rhythm.  Pulmonary:     Comments: Decreased breath sounds bilaterally.  Abdominal:     Palpations: Abdomen is soft.  Musculoskeletal:        General: Normal range of motion.     Cervical back: Normal range of motion.  Skin:    General: Skin is warm.  Neurological:     General: No focal deficit present.     Mental Status: She is alert and oriented to person, place, and time.  Psychiatric:        Behavior: Behavior normal.        Judgment: Judgment normal.     LABORATORY DATA:  I have reviewed the data as listed Lab Results  Component Value Date   WBC 10.0 02/21/2024   HGB 13.2 02/21/2024   HCT 40.2 02/21/2024   MCV 81.2 02/21/2024   PLT 376 02/21/2024   Recent Labs    02/21/24 1620  NA 135  K 3.8  CL 100  CO2 24  GLUCOSE 142*  BUN 11  CREATININE 0.68  CALCIUM 9.7  GFRNONAA >60  PROT 8.4*  ALBUMIN 4.3  AST 85*  ALT 63*  ALKPHOS 83  BILITOT 1.2     No results found.  Easy bruising # Abnormal labs including bleeding time 5 minutes  2 seconds [normal 1 to 3 minutes]; clotting time 4 minutes 17 seconds [normal].  # WBC 13.9 hemoglobin 10.7 MCV 79 platelet 334  # I discussed with the patient the multiple etiologies of easy bruising/bleeding-include platelet dysfunction-quantitative/qualitative vs-coagulopathy involving various coagulation factors from liver disease/autoimmune phenomena.  No clinical evidence or concerns of DIC or any acute leukemic process related to  increased bruising.  Patient is on fish oil.  Recommend discontinue fish oil. # The etiology is unclear-recommend checking CBC CMP; peripheral smear; PT PTT.  Consider inhibitor panel/mixing studies-if PT PTT is abnormal;  platelet function tests.   # Microcytic anemia- Hb 10- Mcv- 78 [question thalassemia; versus iron deficiency anemia]-check iron studies.  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   Thank you Dr.Klein for allowing me to participate in the care of your pleasant patient. Please do not hesitate to contact me with questions or concerns in the interim.  RN will call- DISPOSITION: # labs today # follow up TBD- Dr.B   All questions were answered. The patient knows to call the clinic with any problems, questions or concerns.    Gwyn Leos, MD 02/23/2024 11:47 AM

## 2024-02-21 NOTE — Telephone Encounter (Signed)
 Pt would like a call with lab results from 02/21/24 when ready.

## 2024-02-21 NOTE — Progress Notes (Signed)
 She has been having nose bleeds for years. 1.5 months ago had a nose bleed/clots that wouldn't stop while in the [place] She has brought in current lab results from 02/21/24.  She has had a cough for 2-3 years, sees Dr. Marvie Sloop ENT at 3:15 today.

## 2024-02-21 NOTE — Assessment & Plan Note (Addendum)
#   Abnormal labs including bleeding time 5 minutes 2 seconds [normal 1 to 3 minutes]; clotting time 4 minutes 17 seconds [normal].  # WBC 13.9 hemoglobin 10.7 MCV 79 platelet 334  # I discussed with the patient the multiple etiologies of easy bruising/bleeding-include platelet dysfunction-quantitative/qualitative vs-coagulopathy involving various coagulation factors from liver disease/autoimmune phenomena.  No clinical evidence or concerns of DIC or any acute leukemic process related to increased bruising.  Patient is on fish oil.  Recommend discontinue fish oil. # The etiology is unclear-recommend checking CBC CMP; peripheral smear; PT PTT.  Consider inhibitor panel/mixing studies-if PT PTT is abnormal;  platelet function tests.   # Microcytic anemia- Hb 10- Mcv- 78 [question thalassemia; versus iron deficiency anemia]-check iron studies.  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   Thank you Dr.Klein for allowing me to participate in the care of your pleasant patient. Please do not hesitate to contact me with questions or concerns in the interim.  RN will call- DISPOSITION: # labs today # follow up TBD- Dr.B

## 2024-02-22 LAB — VON WILLEBRAND PANEL
Coagulation Factor VIII: 108 % (ref 56–140)
Ristocetin Co-factor, Plasma: 10 % — ABNORMAL LOW (ref 50–200)
Von Willebrand Antigen, Plasma: 42 % — ABNORMAL LOW (ref 50–200)

## 2024-02-22 LAB — COAG STUDIES INTERP REPORT

## 2024-02-23 ENCOUNTER — Telehealth: Payer: Self-pay | Admitting: Internal Medicine

## 2024-02-23 ENCOUNTER — Encounter: Payer: Self-pay | Admitting: Internal Medicine

## 2024-02-23 NOTE — Telephone Encounter (Signed)
 See other message, Dr. B is having pt sch appt to discuss.

## 2024-02-23 NOTE — Telephone Encounter (Signed)
 Please inform patient-I would want to have the patient come back in the clinic next week/to review the results of the blood work that she had in the clinic this week.  Thanks GB

## 2024-02-23 NOTE — Progress Notes (Unsigned)
 [facility] [address] [place] [address] Phone: [phone] Subjective:   IBryan [name], am serving as a scribe for Dr. Ronnell Coins.  I'm seeing this patient by the request  of:  Meredith Spoon, MD  CC: Back and neck pain follow-up  [id number]  Meredith [name] is a 58 y.o. female coming in with complaint of back and neck pain. OMT on 08/17/2023. Patient states maybe time for another injection for back.   Medications patient has been prescribed:   Taking:         Reviewed prior external information including notes and imaging from previsou exam, outside providers and external EMR if available.   As well as notes that were available from care everywhere and other healthcare systems.  Past medical history, social, surgical and family history all reviewed in electronic medical record.  No pertanent information unless stated regarding to the chief complaint.   Past Medical History:  Diagnosis Date   Anemia    Anxiety    Asthma    Chronic insomnia    Depression    Dermatitis    Gastroparesis    GERD (gastroesophageal reflux disease)    Hypertension    Hypothyroidism    Pre-diabetes    Psoriasis    Rhinitis     Allergies  Allergen Reactions   Latex Rash   Shellfish Allergy Rash    Not all the time    Amoxicillin-Pot Clavulanate     unknown     Review of Systems:  No headache, visual changes, nausea, vomiting, diarrhea, constipation, dizziness, abdominal pain, skin rash, fevers, chills, night sweats, weight loss, swollen lymph nodes,  joint swelling, chest pain, shortness of breath, mood changes. POSITIVE muscle aches, body aches  Objective  Blood pressure 128/84, pulse 81, height 5' (1.524 m), weight 130 lb (59 kg), SpO2 98%.   General: No apparent distress alert and oriented x3 mood and affect normal, dressed appropriately.  HEENT: Pupils equal, extraocular movements intact  Respiratory: Patient's speak in full  sentences and does not appear short of breath  Cardiovascular: No lower extremity edema, non tender, no erythema  Gait normal overall MSK:  Back does have some loss lordosis noted.  Some tenderness to palpation in the paraspinal musculature.  Severe limited range of motion in all planes of the lumbar spine.  Osteopathic findings  C2 flexed rotated and side bent right C6 flexed rotated and side bent left T3 extended rotated and side bent right inhaled rib T9 extended rotated and side bent left L2 flexed rotated and side bent right L3 flexed rotated and side bent left L4 flexed rotated and side bent right Sacrum right on right       Assessment and Plan:  Spinal stenosis, lumbar region, with neurogenic claudication Known significant spinal stenosis.  Has been seen multiple providers.  Discussed though with patient having the spinal stenosis and not having as much radicular symptoms but more of the facet pain it appears at the moment.  We discussed potentially repeating the medial branch blocks.  If patient responds to this would be a candidate for radiofrequency ablation that did give her the most benefit.  Will do the same levels again to see if this would be beneficial.  Has been greater than 2 years at this time.  Had relieved a significant amount of the discomfort initially.  Patient will follow-up with me again 6 weeks after the injections.  Attempted osteopathic manipulation with some  mild benefit noted today.    Nonallopathic problems  Decision today to treat with OMT was based on Physical Exam  After verbal consent patient was treated with HVLA, ME, FPR techniques in cervical, rib, thoracic, lumbar, and sacral  areas  Patient tolerated the procedure well with improvement in symptoms  Patient given exercises, stretches and lifestyle modifications  See medications in patient instructions if given  Patient will follow up in 6-8 weeks    The above documentation has been  reviewed and is accurate and complete Tandrea Kommer M Jameson Tormey, DO          Note: This dictation was prepared with Dragon dictation along with smaller phrase technology. Any transcriptional errors that result from this process are unintentional.

## 2024-02-26 ENCOUNTER — Ambulatory Visit (INDEPENDENT_AMBULATORY_CARE_PROVIDER_SITE_OTHER): Payer: 59 | Admitting: Family Medicine

## 2024-02-26 VITALS — BP 128/84 | HR 81 | Ht 60.0 in | Wt 130.0 lb

## 2024-02-26 DIAGNOSIS — M9903 Segmental and somatic dysfunction of lumbar region: Secondary | ICD-10-CM

## 2024-02-26 DIAGNOSIS — M47816 Spondylosis without myelopathy or radiculopathy, lumbar region: Secondary | ICD-10-CM | POA: Diagnosis not present

## 2024-02-26 DIAGNOSIS — M9902 Segmental and somatic dysfunction of thoracic region: Secondary | ICD-10-CM | POA: Diagnosis not present

## 2024-02-26 DIAGNOSIS — M9908 Segmental and somatic dysfunction of rib cage: Secondary | ICD-10-CM

## 2024-02-26 DIAGNOSIS — M9904 Segmental and somatic dysfunction of sacral region: Secondary | ICD-10-CM

## 2024-02-26 DIAGNOSIS — M5417 Radiculopathy, lumbosacral region: Secondary | ICD-10-CM | POA: Diagnosis not present

## 2024-02-26 DIAGNOSIS — M9901 Segmental and somatic dysfunction of cervical region: Secondary | ICD-10-CM

## 2024-02-26 DIAGNOSIS — M48062 Spinal stenosis, lumbar region with neurogenic claudication: Secondary | ICD-10-CM | POA: Diagnosis not present

## 2024-02-26 NOTE — Patient Instructions (Addendum)
 Good to see you!  Imaging [phone]  Acupuncture and cupping Upright go necklace See you again in 6 weeks

## 2024-02-27 ENCOUNTER — Other Ambulatory Visit: Payer: Self-pay

## 2024-02-27 ENCOUNTER — Encounter: Payer: Self-pay | Admitting: Family Medicine

## 2024-02-27 DIAGNOSIS — M47816 Spondylosis without myelopathy or radiculopathy, lumbar region: Secondary | ICD-10-CM

## 2024-02-27 NOTE — Assessment & Plan Note (Signed)
 Known significant spinal stenosis.  Has been seen multiple providers.  Discussed though with patient having the spinal stenosis and not having as much radicular symptoms but more of the facet pain it appears at the moment.  We discussed potentially repeating the medial branch blocks.  If patient responds to this would be a candidate for radiofrequency ablation that did give her the most benefit.  Will do the same levels again to see if this would be beneficial.  Has been greater than 2 years at this time.  Had relieved a significant amount of the discomfort initially.  Patient will follow-up with me again 6 weeks after the injections.  Attempted osteopathic manipulation with some mild benefit noted today.

## 2024-02-28 ENCOUNTER — Inpatient Hospital Stay: Attending: Internal Medicine | Admitting: Internal Medicine

## 2024-02-28 ENCOUNTER — Encounter: Payer: Self-pay | Admitting: Internal Medicine

## 2024-02-28 ENCOUNTER — Inpatient Hospital Stay

## 2024-02-28 ENCOUNTER — Telehealth: Payer: Self-pay | Admitting: Family Medicine

## 2024-02-28 VITALS — BP 125/81 | HR 95 | Temp 97.8°F | Resp 16 | Wt 130.5 lb

## 2024-02-28 DIAGNOSIS — R233 Spontaneous ecchymoses: Secondary | ICD-10-CM

## 2024-02-28 DIAGNOSIS — Z79899 Other long term (current) drug therapy: Secondary | ICD-10-CM | POA: Diagnosis not present

## 2024-02-28 DIAGNOSIS — E538 Deficiency of other specified B group vitamins: Secondary | ICD-10-CM | POA: Diagnosis not present

## 2024-02-28 DIAGNOSIS — Z79620 Long term (current) use of immunosuppressive biologic: Secondary | ICD-10-CM | POA: Insufficient documentation

## 2024-02-28 DIAGNOSIS — D509 Iron deficiency anemia, unspecified: Secondary | ICD-10-CM | POA: Diagnosis not present

## 2024-02-28 LAB — ABO/RH: ABO/RH(D): B POS

## 2024-02-28 NOTE — Telephone Encounter (Signed)
 Meredith [name] called regarding the referral sent to [facility] in [place] due to [facility] not accepting what she was referred there for. He states that they do not want to go to [facility] in [place], they would like to go to [facility] in [place].

## 2024-02-28 NOTE — Progress Notes (Signed)
 [facility] CONSULT NOTE  Patient Care Team: Melchor Spoon, MD as PCP - General (Internal Medicine) Gwyn Leos, MD as Consulting Physician (Hematology and Oncology)  CHIEF COMPLAINTS/PURPOSE OF CONSULTATION: Easy bruising   HEMATOLOGY HISTORY:  # EASY BRUISING:   HISTORY OF PRESENTING ILLNESS: Patient ambulating-independently. Accompanied by family.   Meredith [name] 58 y.o.  female is here to review the workup for easy bruising/bleeding.   Patient has been having nose bleeds for years. 1.5 months ago had a nose bleed/clots that wouldn't stop while in the [place] With clots from throat.  S/p Transexmic acid in IV.    Patient status post evaluation with Dr. Marvie Sloop ENT-status post cautery of the nasal blood vessel.  Denies any further episodes of bleeding.   MEDICAL HISTORY:  Past Medical History:  Diagnosis Date   Anemia    Anxiety    Asthma    Chronic insomnia    Depression    Dermatitis    Gastroparesis    GERD (gastroesophageal reflux disease)    Hypertension    Hypothyroidism    Pre-diabetes    Psoriasis    Rhinitis     SURGICAL HISTORY: Past Surgical History:  Procedure Laterality Date   APPENDECTOMY     CHOLECYSTECTOMY  2017   EYE SURGERY     LUMBAR LAMINECTOMY/DECOMPRESSION MICRODISCECTOMY N/A 04/22/2021   Procedure: LAMINECTOMY AND FORAMINOTOMY Lumbar three-four, Lumbar four-five;  Surgeon: Van Gelinas, MD;  Location: [facility] OR;  Service: Neurosurgery;  Laterality: N/A;    SOCIAL HISTORY: Social History   Socioeconomic History   Marital status: Widowed    Spouse name: Not on file   Number of children: Not on file   Years of education: Not on file   Highest education level: Not on file  Occupational History   Occupation: unemployed   Tobacco Use   Smoking status: Never   Smokeless tobacco: Never  Vaping Use   Vaping status: Never Used  Substance and Sexual Activity   Alcohol use: No   Drug use: No    Sexual activity: Yes    Comment: post-menopausa;  Other Topics Concern   Not on file  Social History Narrative   Not on file   Social Drivers of Health   Financial Resource Strain: Low Risk  (07/17/2023)   Received from [facility]   Overall Financial Resource Strain (CARDIA)    Difficulty of Paying Living Expenses: Not hard at all  Food Insecurity: No Food Insecurity (02/21/2024)   Hunger Vital Sign    Worried About Running Out of Food in the Last Year: Never true    Ran Out of Food in the Last Year: Never true  Transportation Needs: No Transportation Needs (02/21/2024)   PRAPARE - Administrator, Civil Service (Medical): No    Lack of Transportation (Non-Medical): No  Physical Activity: Not on file  Stress: Stress Concern Present (12/24/2019)   [employer] of Occupational Health - Occupational Stress Questionnaire    Feeling of Stress : Rather much  Social Connections: Unknown (03/05/2022)   Received from [facility]   Social Network    Social Network: Not on file  Intimate Partner Violence: Not At Risk (02/21/2024)   Humiliation, Afraid, Rape, and Kick questionnaire    Fear of Current or Ex-Partner: No    Emotionally Abused: No    Physically Abused: No    Sexually Abused: No    FAMILY HISTORY: Family  History  Problem Relation Age of Onset   Diabetes Mother    Heart attack Mother    Hyperlipidemia Father    Hypertension Father    Eczema Father    Breast cancer Sister    Colon cancer Son     ALLERGIES:  is allergic to latex, shellfish allergy, and amoxicillin-pot clavulanate.  MEDICATIONS:  Current Outpatient Medications  Medication Sig Dispense Refill   acyclovir  ointment (ZOVIRAX ) 5 % Apply 1 Application topically as directed. Apply to sores on buttocks 4-5 times daily prn flares 30 g 3   albuterol (VENTOLIN HFA) 108 (90 Base) MCG/ACT inhaler Inhale 1 puff into the lungs every 4 (four) hours as needed for  wheezing or shortness of breath.     Apremilast  (OTEZLA ) 30 MG TABS Take 1 tablet (30 mg total) by mouth 2 (two) times daily. 60 tablet 2   betamethasone  dipropionate (DIPROLENE ) 0.05 % ointment APPLY TO AREA ON LOWER LEGS ONCE DAILY UNTIL IMPROVED THEN ONLY  ON WEEKENDS. AVOID FACE, GROIN,  AXILLA RISK OF SKIN ATROPHY WITH LONG TERM USE 135 g 0   carboxymethylcellulose (REFRESH PLUS) 0.5 % SOLN Place 1 drop into both eyes daily as needed (dry eyes).     celecoxib (CELEBREX) 200 MG capsule Take 200 mg by mouth 2 (two) times daily.     cetirizine (ZYRTEC) 10 MG tablet Take 10 mg by mouth daily as needed for allergies.     Cholecalciferol 50 MCG (2000 UT) CAPS Take 2,000 Units by mouth in the morning and at bedtime.     cyanocobalamin  (,VITAMIN B-12,) 1000 MCG/ML injection INJECT 1ML INTO THE MUSCLE EVERY 30 DAYS (Patient taking differently: Inject 1,000 mcg into the muscle every 30 (thirty) days. INJECT INTO THE MUSCLE EVERY 30 DAYS) 1 mL 3   DEXILANT 60 MG capsule Take 60 mg by mouth daily.     DULoxetine  (CYMBALTA ) 60 MG capsule Take 60 mg by mouth 2 (two) times daily.     famotidine (PEPCID) 20 MG tablet Take 20 mg by mouth at bedtime.     gabapentin  (NEURONTIN ) 300 MG capsule Take 1 capsule (300 mg total) by mouth 2 (two) times daily. (Patient taking differently: Take 300 mg by mouth 3 (three) times daily.  3 times a day per pt) 180 capsule 1   hydrochlorothiazide (MICROZIDE) 12.5 MG capsule Take 12.5 mg by mouth daily.     hydroquinone  4 % cream Apply topically 2 (two) times daily. Use for up to 3 months on any dark spots. After 3 months take a break for 3 months. 28.35 g 2   levothyroxine  (SYNTHROID ) 125 MCG tablet Take 125 mcg by mouth daily before breakfast.     losartan  (COZAAR ) 25 MG tablet Take 1 tablet (25 mg total) by mouth at bedtime. 90 tablet 1   Multiple Vitamin (MULTIVITAMIN ADULT PO) Take 1 tablet by mouth daily.     nortriptyline (PAMELOR) 10 MG capsule Take 40 mg by mouth at  bedtime.     Omega-3 1000 MG CAPS Take 1,000 mg by mouth daily.     polyethylene glycol powder (GLYCOLAX/MIRALAX) 17 GM/SCOOP powder Take 17 g by mouth daily.     Roflumilast  (ZORYVE ) 0.3 % CREA Apply to affected area lower leg once daily. 60 g 2   SYRINGE-NEEDLE, DISP, 3 ML (B-D 3CC LUER-LOK SYR 25GX1") 25G X 1" 3 ML MISC Use 1 syringe with needle monthly with B-1 2injections 1 each 6   tacrolimus  (PROTOPIC ) 0.1 % ointment Apply  topically once to twice daily to affected areas right leg, face. 100 g 2   Tapinarof  (VTAMA ) 1 % CREA Apply topically to affected areas of legs for psoriasis 60 g 1   valACYclovir  (VALTREX ) 500 MG tablet TAKE 1 TABLET BY MOUTH TWICE  DAILY 180 tablet 3   vitamin C (ASCORBIC ACID) 500 MG tablet Take 500 mg by mouth daily.     vitamin E 180 MG (400 UNITS) capsule Take 400 Units by mouth daily.     zolpidem  (AMBIEN  CR) 12.5 MG CR tablet Take 1 tablet (12.5 mg total) by mouth at bedtime as needed. (Patient taking differently: Take 12.5 mg by mouth at bedtime.) 30 tablet 1   albendazole  (ALBENZA ) 200 MG tablet Take 2 tablets (400 mg total) by mouth 2 (two) times daily. (Patient not taking: Reported on 02/28/2024) 20 tablet 0   cyclobenzaprine  (FLEXERIL ) 10 MG tablet Take 1 tablet (10 mg total) by mouth 3 (three) times daily as needed for muscle spasms. (Patient not taking: Reported on 02/28/2024) 40 tablet 0   traMADol  (ULTRAM ) 50 MG tablet Take 1 tablet (50 mg total) by mouth every 6 (six) hours as needed. (Patient not taking: Reported on 02/28/2024) 20 tablet 0   No current facility-administered medications for this visit.    PHYSICAL EXAMINATION:   Vitals:   02/28/24 0946  BP: 125/81  Pulse: 95  Resp: 16  Temp: 97.8 F (36.6 C)  SpO2: 100%   Filed Weights   02/28/24 0946  Weight: 130 lb 8 oz (59.2 kg)    Physical Exam Vitals and nursing note reviewed.  HENT:     Head: Normocephalic and atraumatic.     Mouth/Throat:     Pharynx: Oropharynx is clear.  Eyes:      Extraocular Movements: Extraocular movements intact.     Pupils: Pupils are equal, round, and reactive to light.  Cardiovascular:     Rate and Rhythm: Normal rate and regular rhythm.  Pulmonary:     Comments: Decreased breath sounds bilaterally.  Abdominal:     Palpations: Abdomen is soft.  Musculoskeletal:        General: Normal range of motion.     Cervical back: Normal range of motion.  Skin:    General: Skin is warm.  Neurological:     General: No focal deficit present.     Mental Status: She is alert and oriented to person, place, and time.  Psychiatric:        Behavior: Behavior normal.        Judgment: Judgment normal.     LABORATORY DATA:  I have reviewed the data as listed Lab Results  Component Value Date   WBC 10.0 02/21/2024   HGB 13.2 02/21/2024   HCT 40.2 02/21/2024   MCV 81.2 02/21/2024   PLT 376 02/21/2024   Recent Labs    02/21/24 1620  NA 135  K 3.8  CL 100  CO2 24  GLUCOSE 142*  BUN 11  CREATININE 0.68  CALCIUM 9.7  GFRNONAA >60  PROT 8.4*  ALBUMIN 4.3  AST 85*  ALT 63*  ALKPHOS 83  BILITOT 1.2     No results found.  Easy bruising # March-[April 2025 [place] nose bleed[place] Abnormal labs including bleeding time 5 minutes 2 seconds [normal 1 to 3 minutes]; clotting time 4 minutes 17 seconds [normal]  # APRIL 2025- PT PTT normal;  platelet function assay-abnormal; von Willebrand antigen 42% [Dx low-less than 30]; ristocetin cofactor- 10%;-diagnostic of von  Willebrand disease.  However based on ratio-ristocetin cofactor /antigen  < 0.7-vWD type-2- check Multimer assay; ? RIPA [no [facility]]; type 3- severe [hx of hemorrhages]   Recommend further workup-including repeating von Willebrand screening panel; ABO; also check secondary causes including but not limited to multiple myeloma, thyroid.  Less likely cardiac etiology.  MM panel; K/l light chains; von Willebrand screen repeating. Multimer analysis; Thyroid profile.   # Microcytic  anemia- Hb 10- Mcv- 78 [question thalassemia-normal iron studies.  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   DISPOSITION: # labs today # follow up in 1st week of June 2025-MD; No labs- - Dr.B    All questions were answered. The patient knows to call the clinic with any problems, questions or concerns.    Gwyn Leos, MD 02/28/2024 1:18 PM

## 2024-02-28 NOTE — Assessment & Plan Note (Addendum)
#   March-[April 2025 [place] nose bleed[place] Abnormal labs including bleeding time 5 minutes 2 seconds [normal 1 to 3 minutes]; clotting time 4 minutes 17 seconds [normal]  # APRIL 2025- PT PTT normal;  platelet function assay-abnormal; von Willebrand antigen 42% [Dx low-less than 30]; ristocetin cofactor- 10%;-diagnostic of von Willebrand disease.  However based on ratio-ristocetin cofactor /antigen  < 0.7-vWD type-2- check Multimer assay; ? RIPA [no [facility]]; type 3- severe [hx of hemorrhages]   Recommend further workup-including repeating von Willebrand screening panel; ABO; also check secondary causes including but not limited to multiple myeloma, thyroid.  Less likely cardiac etiology.  MM panel; K/l light chains; von Willebrand screen repeating. Multimer analysis; Thyroid profile.   # Microcytic anemia- Hb 10- Mcv- 78 [question thalassemia-normal iron studies.  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   DISPOSITION: # labs today # follow up in 1st week of June 2025-MD; No labs- - Dr.B

## 2024-02-28 NOTE — Progress Notes (Signed)
 Pt in for follow up of lab work, anxious for results. Denies any other concerns today.

## 2024-02-29 LAB — KAPPA/LAMBDA LIGHT CHAINS
Kappa free light chain: 18.6 mg/L (ref 3.3–19.4)
Kappa, lambda light chain ratio: 1.27 (ref 0.26–1.65)
Lambda free light chains: 14.7 mg/L (ref 5.7–26.3)

## 2024-02-29 LAB — THYROID PANEL WITH TSH
Free Thyroxine Index: 3.4 (ref 1.2–4.9)
T3 Uptake Ratio: 30 % (ref 24–39)
T4, Total: 11.3 ug/dL (ref 4.5–12.0)
TSH: 0.659 u[IU]/mL (ref 0.450–4.500)

## 2024-03-01 LAB — VON WILLEBRAND PANEL
Coagulation Factor VIII: 91 % (ref 56–140)
Ristocetin Co-factor, Plasma: <10 % — ABNORMAL LOW (ref 50–200)
Von Willebrand Antigen, Plasma: 36 % — ABNORMAL LOW (ref 50–200)

## 2024-03-01 LAB — MULTIPLE MYELOMA PANEL, SERUM
Albumin SerPl Elph-Mcnc: 3.7 g/dL (ref 2.9–4.4)
Albumin/Glob SerPl: 1.1 (ref 0.7–1.7)
Alpha 1: 0.2 g/dL (ref 0.0–0.4)
Alpha2 Glob SerPl Elph-Mcnc: 0.7 g/dL (ref 0.4–1.0)
B-Globulin SerPl Elph-Mcnc: 1.3 g/dL (ref 0.7–1.3)
Gamma Glob SerPl Elph-Mcnc: 1.3 g/dL (ref 0.4–1.8)
Globulin, Total: 3.5 g/dL (ref 2.2–3.9)
IgA: 406 mg/dL — ABNORMAL HIGH (ref 87–352)
IgG (Immunoglobin G), Serum: 1356 mg/dL (ref 586–1602)
IgM (Immunoglobulin M), Srm: 69 mg/dL (ref 26–217)
Total Protein ELP: 7.2 g/dL (ref 6.0–8.5)

## 2024-03-01 LAB — COAG STUDIES INTERP REPORT

## 2024-03-01 NOTE — Telephone Encounter (Signed)
 Dirk Fredericks called back and says [facility] in [place] still does not have the referral that he called about. He asked to give him a call back and give him an update.

## 2024-03-01 NOTE — Telephone Encounter (Signed)
 Ordered faxed to [facility].

## 2024-03-13 ENCOUNTER — Encounter

## 2024-03-20 ENCOUNTER — Ambulatory Visit
Admission: RE | Admit: 2024-03-20 | Discharge: 2024-03-20 | Disposition: A | Discharge status: Home / Self Care | Source: Ambulatory Visit | Attending: Internal Medicine | Admitting: Internal Medicine

## 2024-03-20 DIAGNOSIS — Z1231 Encounter for screening mammogram for malignant neoplasm of breast: Secondary | ICD-10-CM | POA: Insufficient documentation

## 2024-03-25 ENCOUNTER — Inpatient Hospital Stay: Attending: Internal Medicine | Admitting: Internal Medicine

## 2024-03-25 VITALS — BP 130/86 | HR 81 | Temp 99.2°F | Resp 18 | Ht 60.0 in | Wt 131.0 lb

## 2024-03-25 DIAGNOSIS — R233 Spontaneous ecchymoses: Secondary | ICD-10-CM | POA: Diagnosis not present

## 2024-03-25 DIAGNOSIS — D6800 Von Willebrand disease, unspecified: Secondary | ICD-10-CM | POA: Diagnosis present

## 2024-03-25 DIAGNOSIS — Z79899 Other long term (current) drug therapy: Secondary | ICD-10-CM | POA: Diagnosis not present

## 2024-03-25 DIAGNOSIS — Z800 Family history of malignant neoplasm of digestive organs: Secondary | ICD-10-CM | POA: Insufficient documentation

## 2024-03-25 DIAGNOSIS — Z803 Family history of malignant neoplasm of breast: Secondary | ICD-10-CM | POA: Diagnosis not present

## 2024-03-25 DIAGNOSIS — E538 Deficiency of other specified B group vitamins: Secondary | ICD-10-CM | POA: Insufficient documentation

## 2024-03-25 NOTE — Progress Notes (Signed)
 [facility] CONSULT NOTE  Patient Care Team: Melchor Spoon, MD as PCP - General (Internal Medicine) Gwyn Leos, MD as Consulting Physician (Hematology and Oncology)  CHIEF COMPLAINTS/PURPOSE OF CONSULTATION: Easy bruising   HEMATOLOGY HISTORY:  # EASY BRUISING:   HISTORY OF PRESENTING ILLNESS: Patient ambulating-independently. Accompanied by family.   Meredith [name] 58 y.o.  female with new diagnosis of von Willebrand disease [APRIL MAY 2025] is here to review labs/treatment plan.  Patient status post evaluation with Dr. Marvie Sloop ENT-status post cautery of the nasal blood vessel.  Denies any further episodes of bleeding.     MEDICAL HISTORY:  Past Medical History:  Diagnosis Date   Anemia    Anxiety    Asthma    Chronic insomnia    Depression    Dermatitis    Gastroparesis    GERD (gastroesophageal reflux disease)    Hypertension    Hypothyroidism    Pre-diabetes    Psoriasis    Rhinitis     SURGICAL HISTORY: Past Surgical History:  Procedure Laterality Date   APPENDECTOMY     CHOLECYSTECTOMY  2017   EYE SURGERY     LUMBAR LAMINECTOMY/DECOMPRESSION MICRODISCECTOMY N/A 04/22/2021   Procedure: LAMINECTOMY AND FORAMINOTOMY Lumbar three-four, Lumbar four-five;  Surgeon: Van Gelinas, MD;  Location: [facility] OR;  Service: Neurosurgery;  Laterality: N/A;    SOCIAL HISTORY: Social History   Socioeconomic History   Marital status: Widowed    Spouse name: Not on file   Number of children: Not on file   Years of education: Not on file   Highest education level: Not on file  Occupational History   Occupation: unemployed   Tobacco Use   Smoking status: Never   Smokeless tobacco: Never  Vaping Use   Vaping status: Never Used  Substance and Sexual Activity   Alcohol use: No   Drug use: No   Sexual activity: Yes    Comment: post-menopausa;  Other Topics Concern   Not on file  Social History Narrative   Not on file    Social Drivers of Health   Financial Resource Strain: Low Risk  (03/13/2024)   Received from [facility]   Overall Financial Resource Strain (CARDIA)    Difficulty of Paying Living Expenses: Not hard at all  Food Insecurity: No Food Insecurity (03/13/2024)   Received from [facility]   Hunger Vital Sign    Worried About Running Out of Food in the Last Year: Never true    Ran Out of Food in the Last Year: Never true  Transportation Needs: No Transportation Needs (03/13/2024)   Received from [facility]    In the past 12 months, has lack of transportation kept you from medical appointments or from getting medications?: No    Lack of Transportation (Non-Medical): No  Physical Activity: Not on file  Stress: Stress Concern Present (12/24/2019)   [employer] of Occupational Health - Occupational Stress Questionnaire    Feeling of Stress : Rather much  Social Connections: Unknown (03/05/2022)   Received from [facility]   Social Network    Social Network: Not on file  Intimate Partner Violence: Not At Risk (02/21/2024)   Humiliation, Afraid, Rape, and Kick questionnaire    Fear of Current or Ex-Partner: No    Emotionally Abused: No    Physically Abused: No    Sexually Abused: No  FAMILY HISTORY: Family History  Problem Relation Age of Onset   Diabetes Mother    Heart attack Mother    Hyperlipidemia Father    Hypertension Father    Eczema Father    Breast cancer Sister    Colon cancer Son     ALLERGIES:  is allergic to latex, shellfish allergy, and amoxicillin-pot clavulanate.  MEDICATIONS:  Current Outpatient Medications  Medication Sig Dispense Refill   acyclovir  ointment (ZOVIRAX ) 5 % Apply 1 Application topically as directed. Apply to sores on buttocks 4-5 times daily prn flares 30 g 3   SKYRIZI PEN 150 MG/ML pen      albendazole  (ALBENZA ) 200 MG tablet Take 2 tablets  (400 mg total) by mouth 2 (two) times daily. (Patient not taking: Reported on 02/28/2024) 20 tablet 0   albuterol (VENTOLIN HFA) 108 (90 Base) MCG/ACT inhaler Inhale 1 puff into the lungs every 4 (four) hours as needed for wheezing or shortness of breath.     Apremilast  (OTEZLA ) 30 MG TABS Take 1 tablet (30 mg total) by mouth 2 (two) times daily. 60 tablet 2   betamethasone  dipropionate (DIPROLENE ) 0.05 % ointment APPLY TO AREA ON LOWER LEGS ONCE DAILY UNTIL IMPROVED THEN ONLY  ON WEEKENDS. AVOID FACE, GROIN,  AXILLA RISK OF SKIN ATROPHY WITH LONG TERM USE 135 g 0   carboxymethylcellulose (REFRESH PLUS) 0.5 % SOLN Place 1 drop into both eyes daily as needed (dry eyes).     celecoxib (CELEBREX) 200 MG capsule Take 200 mg by mouth 2 (two) times daily.     cetirizine (ZYRTEC) 10 MG tablet Take 10 mg by mouth daily as needed for allergies.     Cholecalciferol 50 MCG (2000 UT) CAPS Take 2,000 Units by mouth in the morning and at bedtime.     cyanocobalamin  (,VITAMIN B-12,) 1000 MCG/ML injection INJECT 1ML INTO THE MUSCLE EVERY 30 DAYS (Patient taking differently: Inject 1,000 mcg into the muscle every 30 (thirty) days. INJECT INTO THE MUSCLE EVERY 30 DAYS) 1 mL 3   cyclobenzaprine  (FLEXERIL ) 10 MG tablet Take 1 tablet (10 mg total) by mouth 3 (three) times daily as needed for muscle spasms. (Patient not taking: Reported on 02/28/2024) 40 tablet 0   DEXILANT 60 MG capsule Take 60 mg by mouth daily.     DULoxetine  (CYMBALTA ) 60 MG capsule Take 60 mg by mouth 2 (two) times daily.     famotidine (PEPCID) 20 MG tablet Take 20 mg by mouth at bedtime.     gabapentin  (NEURONTIN ) 300 MG capsule Take 1 capsule (300 mg total) by mouth 2 (two) times daily. (Patient taking differently: Take 300 mg by mouth 3 (three) times daily.  3 times a day per pt) 180 capsule 1   hydrochlorothiazide (MICROZIDE) 12.5 MG capsule Take 12.5 mg by mouth daily.     hydroquinone  4 % cream Apply topically 2 (two) times daily. Use for up to  3 months on any dark spots. After 3 months take a break for 3 months. 28.35 g 2   levothyroxine  (SYNTHROID ) 125 MCG tablet Take 125 mcg by mouth daily before breakfast.     losartan  (COZAAR ) 25 MG tablet Take 1 tablet (25 mg total) by mouth at bedtime. 90 tablet 1   Multiple Vitamin (MULTIVITAMIN ADULT PO) Take 1 tablet by mouth daily.     nortriptyline (PAMELOR) 10 MG capsule Take 40 mg by mouth at bedtime.     Omega-3 1000 MG CAPS Take 1,000 mg by mouth daily.  polyethylene glycol powder (GLYCOLAX/MIRALAX) 17 GM/SCOOP powder Take 17 g by mouth daily.     Roflumilast  (ZORYVE ) 0.3 % CREA Apply to affected area lower leg once daily. 60 g 2   SYRINGE-NEEDLE, DISP, 3 ML (B-D 3CC LUER-LOK SYR 25GX1") 25G X 1" 3 ML MISC Use 1 syringe with needle monthly with B-1 2injections 1 each 6   tacrolimus  (PROTOPIC ) 0.1 % ointment Apply topically once to twice daily to affected areas right leg, face. 100 g 2   Tapinarof  (VTAMA ) 1 % CREA Apply topically to affected areas of legs for psoriasis 60 g 1   traMADol  (ULTRAM ) 50 MG tablet Take 1 tablet (50 mg total) by mouth every 6 (six) hours as needed. (Patient not taking: Reported on 02/28/2024) 20 tablet 0   valACYclovir  (VALTREX ) 500 MG tablet TAKE 1 TABLET BY MOUTH TWICE  DAILY 180 tablet 3   vitamin C (ASCORBIC ACID) 500 MG tablet Take 500 mg by mouth daily.     vitamin E 180 MG (400 UNITS) capsule Take 400 Units by mouth daily.     zolpidem  (AMBIEN  CR) 12.5 MG CR tablet Take 1 tablet (12.5 mg total) by mouth at bedtime as needed. (Patient taking differently: Take 12.5 mg by mouth at bedtime.) 30 tablet 1   No current facility-administered medications for this visit.    PHYSICAL EXAMINATION:   Vitals:   03/25/24 1531  BP: 130/86  Pulse: 81  Resp: 18  Temp: 99.2 F (37.3 C)  SpO2: 100%    Filed Weights   03/25/24 1531  Weight: 131 lb (59.4 kg)     Physical Exam Vitals and nursing note reviewed.  HENT:     Head: Normocephalic and  atraumatic.     Mouth/Throat:     Pharynx: Oropharynx is clear.  Eyes:     Extraocular Movements: Extraocular movements intact.     Pupils: Pupils are equal, round, and reactive to light.  Cardiovascular:     Rate and Rhythm: Normal rate and regular rhythm.  Pulmonary:     Comments: Decreased breath sounds bilaterally.  Abdominal:     Palpations: Abdomen is soft.  Musculoskeletal:        General: Normal range of motion.     Cervical back: Normal range of motion.  Skin:    General: Skin is warm.  Neurological:     General: No focal deficit present.     Mental Status: She is alert and oriented to person, place, and time.  Psychiatric:        Behavior: Behavior normal.        Judgment: Judgment normal.     LABORATORY DATA:  I have reviewed the data as listed Lab Results  Component Value Date   WBC 10.0 02/21/2024   HGB 13.2 02/21/2024   HCT 40.2 02/21/2024   MCV 81.2 02/21/2024   PLT 376 02/21/2024   Recent Labs    02/21/24 1620  NA 135  K 3.8  CL 100  CO2 24  GLUCOSE 142*  BUN 11  CREATININE 0.68  CALCIUM 9.7  GFRNONAA >60  PROT 8.4*  ALBUMIN 4.3  AST 85*  ALT 63*  ALKPHOS 83  BILITOT 1.2     MM 3D SCREENING MAMMOGRAM BILATERAL BREAST Result Date: 03/25/2024 CLINICAL DATA:  Screening. EXAM: DIGITAL SCREENING BILATERAL MAMMOGRAM WITH TOMOSYNTHESIS AND CAD TECHNIQUE: Bilateral screening digital craniocaudal and mediolateral oblique mammograms were obtained. Bilateral screening digital breast tomosynthesis was performed. The images were evaluated with computer-aided detection. COMPARISON:  Previous exam(s). ACR Breast Density Category c: The breasts are heterogeneously dense, which may obscure small masses. FINDINGS: In the left breast, a possible asymmetry warrants further evaluation. In the right breast, no findings suspicious for malignancy. IMPRESSION: Further evaluation is suggested for possible asymmetry in the left breast. RECOMMENDATION: Diagnostic  mammogram and possibly ultrasound of the left breast. (Code:[id number]-L-101M) The patient will be contacted regarding the findings, and additional imaging will be scheduled. BI-RADS CATEGORY  0: Incomplete: Need additional imaging evaluation. Electronically Signed   By: Sande Cromer M.D.   On: 03/25/2024 12:41    Von Willebrand disease (HCC) # March-[April 2025 [place] nose bleed[place] Abnormal labs including bleeding time 5 minutes 2 seconds [normal 1 to 3 minutes]; clotting time 4 minutes 17 seconds [normal]; APRIL/MAY 2025- PT PTT normal;  platelet function assay-abnormal; von Willebrand antigen 42% [Dx low-less than 30]; ristocetin cofactor- 10%;-diagnostic of von Willebrand disease.   Factor VIII-Normal. [90-108]; MM panel; K/l light chains;  Thyroid  profile- WML.   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 RIPA [no [facility]];UNLIKELY  type 3 [given WNL-VIII activity].   # Discussed with patient and family that we will currently await multimer analysis that will help to decipher the subset of type II von Willebrand disease.  Patient is currently asymptomatic.  Discussed the use of DDAVP to assess response; and consider DDAVP-if and when needed prior to surgical procedures or in the event of bleeding.   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  # Discussed regarding avoidance of antiplatelet therapy like aspirin NSAIDs-ibuprofen Advil naproxen.  Reasonable to take Tylenol  as needed.  Recommend workup of family/children for von Willebrand disease.  DISPOSITION: # follow up TBD  - Dr.B    All questions were answered. The patient knows to call the clinic with any problems, questions or concerns.    Gwyn Leos, MD 03/25/2024 4:05 PM

## 2024-03-25 NOTE — Assessment & Plan Note (Deleted)
#   March-[April 2025 [place] nose bleed[place] Abnormal labs including bleeding time 5 minutes 2 seconds [normal 1 to 3 minutes]; clotting time 4 minutes 17 seconds [normal]  # APRIL 2025- PT PTT normal;  platelet function assay-abnormal; von Willebrand antigen 42% [Dx low-less than 30]; ristocetin cofactor- 10%;-diagnostic of von Willebrand disease.  However based on ratio-ristocetin cofactor /antigen  < 0.7-vWD type-2- check Multimer assay; ? RIPA [no [facility]]; type 3- severe [hx of hemorrhages]   Recommend further workup-including repeating von Willebrand screening panel; ABO; also check secondary causes including but not limited to multiple myeloma, thyroid.  Less likely cardiac etiology.  MM panel; K/l light chains; von Willebrand screen repeating. Multimer analysis; Thyroid profile.   # Microcytic anemia- Hb 10- Mcv- 78 [question thalassemia-normal iron studies.  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   DISPOSITION: # labs today # follow up in 1st week of June 2025-MD; No labs- - Dr.B

## 2024-03-25 NOTE — Assessment & Plan Note (Addendum)
#   March-[April 2025 [place] nose bleed[place] Abnormal labs including bleeding time 5 minutes 2 seconds [normal 1 to 3 minutes]; clotting time 4 minutes 17 seconds [normal]; APRIL/MAY 2025- PT PTT normal;  platelet function assay-abnormal; von Willebrand antigen 42% [Dx low-less than 30]; ristocetin cofactor- 10%;-diagnostic of von Willebrand disease.   Factor VIII-Normal. [90-108]; MM panel; K/l light chains;  Thyroid  profile- WML.   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 RIPA [no [facility]];UNLIKELY  type 3 [given WNL-VIII activity].   # Discussed with patient and family that we will currently await multimer analysis that will help to decipher the subset of type II von Willebrand disease.  Patient is currently asymptomatic.  Discussed the use of DDAVP to assess response; and consider DDAVP-if and when needed prior to surgical procedures or in the event of bleeding.   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  # Discussed regarding avoidance of antiplatelet therapy like aspirin NSAIDs-ibuprofen Advil naproxen.  Reasonable to take Tylenol  as needed.  Recommend workup of family/children for von Willebrand disease.  DISPOSITION: # follow up TBD  - Dr.B

## 2024-03-25 NOTE — Progress Notes (Signed)
 Patient is still having some abdomen pain in her right side. She did have a mammogram on  03/20/2024.

## 2024-03-26 ENCOUNTER — Other Ambulatory Visit: Payer: Self-pay | Admitting: Internal Medicine

## 2024-03-26 DIAGNOSIS — R928 Other abnormal and inconclusive findings on diagnostic imaging of breast: Secondary | ICD-10-CM

## 2024-03-27 ENCOUNTER — Ambulatory Visit
Admission: RE | Admit: 2024-03-27 | Discharge: 2024-03-27 | Disposition: A | Discharge status: Home / Self Care | Source: Ambulatory Visit | Attending: Internal Medicine | Admitting: Internal Medicine

## 2024-03-27 ENCOUNTER — Encounter: Payer: Self-pay | Admitting: Physical Medicine and Rehabilitation

## 2024-03-27 ENCOUNTER — Ambulatory Visit (INDEPENDENT_AMBULATORY_CARE_PROVIDER_SITE_OTHER): Admitting: Physical Medicine and Rehabilitation

## 2024-03-27 DIAGNOSIS — R928 Other abnormal and inconclusive findings on diagnostic imaging of breast: Secondary | ICD-10-CM | POA: Diagnosis present

## 2024-03-27 DIAGNOSIS — M47819 Spondylosis without myelopathy or radiculopathy, site unspecified: Secondary | ICD-10-CM

## 2024-03-27 DIAGNOSIS — M545 Low back pain, unspecified: Secondary | ICD-10-CM | POA: Diagnosis not present

## 2024-03-27 DIAGNOSIS — M47816 Spondylosis without myelopathy or radiculopathy, lumbar region: Secondary | ICD-10-CM | POA: Diagnosis not present

## 2024-03-27 DIAGNOSIS — G8929 Other chronic pain: Secondary | ICD-10-CM

## 2024-03-27 NOTE — Progress Notes (Signed)
 Pain Scale   Average Pain 7 Patient advising she has Chronic lower back pain and she has hd injections in the past and is requesting to have another injection        +Driver, -BT, -Dye Allergies.

## 2024-03-27 NOTE — Progress Notes (Signed)
 Meredith [name] - 58 y.o. female MRN [id number]  Date of birth: 1966-02-20  Office Visit Note: Visit Date: 03/27/2024 PCP: Melchor Spoon, MD Referred by: Melchor Spoon, MD  Subjective: Chief Complaint  Patient presents with   Lower Back - Pain   HPI: Meredith [name] is a 58 y.o. female who comes in today per the request of Dr. Ronnell Coins for evaluation of chronic, worsening and severe bilateral lower back pain radiating down both legs to feet. Biggest pain generator is bilateral lower back. Also reports numbness/tingling to legs. Pain ongoing for several years. Her pain is constant, prolonged sitting causes severe pain. She describes her pain as burning, throbbing and tingling sensation, currently rates as 7 out of 10. Some relief of pain with home exercise regimen, rest and use of medications. History of formal physical therapy with no relief of pain. Lumbar MRI imaging from 2023 shows edematous change of the facet joints at L4-L5 that could be symptomatic. History of bilateral L3-L4 and L4-L5 laminectomy decompression with Dr. Arvilla Birmingham in 2022. She reports minimal relief of pain with surgical intervention. She has undergone multiple injections at [facility] over the years. Most recent was left L4-L5 radiofrequency ablation at [facility], she reports significant relief of pain with this procedure, more than 80% relief of pain for over 1 year. Patient denies focal weakness, numbness and tingling. No recent trauma or falls.      Review of Systems  Musculoskeletal:  Positive for back pain.  Neurological:  Negative for tingling, sensory change, focal weakness and weakness.  All other systems reviewed and are negative.  Otherwise per HPI.  Assessment & Plan: Visit Diagnoses:    ICD-10-CM   1. Chronic bilateral low back pain without sciatica  M54.50 Ambulatory referral to Physical Medicine Rehab   G89.29     2. Spondylosis without myelopathy or radiculopathy   M47.819 Ambulatory referral to Physical Medicine Rehab    3. Facet arthropathy, lumbar  M47.816 Ambulatory referral to Physical Medicine Rehab       Plan: Findings:  Chronic, worsening and severe bilateral lower back pain, radiation of pain down both legs. Bilateral lower back is biggest pain generator. Patient continues to have severe pain despite good conservative therapies such as formal physical therapy, home exercise regimen, rest and use of medications. Patients clinical presentation and exam are consistent with facet mediated pain. I do think pain radiating down the legs is more of a facet joint syndrome. We discussed treatment plan in detail today. Next step is to place orders for diagnostic bilateral L4-L5 facet/medial branch blocks under fluoroscopic guidance. If good relief of pain with diagnostic blocks we discussed longer sustained pain relief with radiofrequency ablation. I discussed injection procedure and RFA, she has no questions at this time. Patient is planning on traveling to [place]  and [place] this summer, we will do our best to get her scheduled before she travels. No red flag symptoms noted upon exam today.     Meds & Orders: No orders of the defined types were placed in this encounter.   Orders Placed This Encounter  Procedures   Ambulatory referral to Physical Medicine Rehab    Follow-up: Return for Bilateral L4-L5 medial branch blocks.   Procedures: No procedures performed      Clinical History: CLINICAL DATA:  Low back pain radiating down the left leg over the last 3 months.   EXAM: MRI LUMBAR SPINE WITHOUT CONTRAST   TECHNIQUE: Multiplanar, multisequence  MR imaging of the lumbar spine was performed. No intravenous contrast was administered.   COMPARISON:  Radiography 03/16/2022.  MRI 08/30/2021.   FINDINGS: Segmentation:  5 lumbar type vertebral bodies.   Alignment:  Normal   Vertebrae: No fracture or edematous lesion. Chronic  benign hemangioma within the L3 vertebral body.   Conus medullaris and cauda equina: Conus extends to the L1 level. Conus and cauda equina appear normal.   Paraspinal and other soft tissues: Negative   Disc levels:   T12-L1: Shallow disc protrusion. No compressive stenosis. No change.   L1-2: Minimal disc bulge.  No stenosis.  No change.   L2-3: Shallow disc protrusion. Slight indentation of the thecal sac. No stenosis. No change.   L3-4: Previous posterior decompression. Shallow disc protrusion. Slight indentation of the thecal sac. No compressive stenosis. No change.   L4-5: Previous posterior decompression. Shallow protrusion of the disc, minimally more prominent. Mild facet hypertrophy. Stenosis of the subarticular lateral recesses left more than right that could cause neural compression, particularly on the left. Edematous change of the facet joints that could be symptomatic.   L5-S1: Mild bulging of the disc. Mild facet degeneration and hypertrophy. No compressive stenosis.   IMPRESSION: Previous posterior decompression at L3-4 and L4-5. No compressive stenosis at L3-4. At L4-5, there is shallow protrusion of the disc which is minimally more prominent. There is bilateral facet hypertrophy worse on the left than the right. There is stenosis of the lateral recesses worse on the left than the right that could cause neural compression.   Chronic, grossly non-compressive degenerative changes at the other levels as outlined above.     Electronically Signed   By: Bettylou Brunner M.D.   On: 03/28/2022 16:36   She reports that she has never smoked. She has never used smokeless tobacco. No results for input(s): "HGBA1C", "LABURIC" in the last 8760 hours.  Objective:  VS:  HT:    WT:   BMI:     BP:   HR: bpm  TEMP: ( )  RESP:  Physical Exam Vitals and nursing note reviewed.  HENT:     Head: Normocephalic and atraumatic.     Right Ear: External ear normal.     Left  Ear: External ear normal.     Nose: Nose normal.     Mouth/Throat:     Mouth: Mucous membranes are moist.  Eyes:     Extraocular Movements: Extraocular movements intact.  Cardiovascular:     Rate and Rhythm: Normal rate.     Pulses: Normal pulses.  Pulmonary:     Effort: Pulmonary effort is normal.  Abdominal:     General: Abdomen is flat. There is no distension.  Musculoskeletal:        General: Tenderness present.     Cervical back: Normal range of motion.     Comments: Patient rises from seated position to standing without difficulty. Good lumbar range of motion. No pain noted with facet loading. 5/5 strength noted with bilateral hip flexion, knee flexion/extension, ankle dorsiflexion/plantarflexion and EHL. No clonus noted bilaterally. No pain upon palpation of greater trochanters. No pain with internal/external rotation of bilateral hips. Sensation intact bilaterally. Negative slump test bilaterally. Ambulates without aid, gait steady.     Skin:    General: Skin is warm and dry.     Capillary Refill: Capillary refill takes less than 2 seconds.  Neurological:     General: No focal deficit present.     Mental Status: She  is alert and oriented to person, place, and time.  Psychiatric:        Mood and Affect: Mood normal.        Behavior: Behavior normal.     Ortho Exam  Imaging: No results found.  Past Medical/Family/Surgical/Social History: Medications & Allergies reviewed per EMR, new medications updated. Patient Active Problem List   Diagnosis Date Noted   Von Willebrand disease (HCC) 03/25/2024   Easy bruising 02/21/2024   Spinal stenosis, lumbar region, with neurogenic claudication 12/10/2020   Carpal tunnel syndrome 10/28/2020   Shingles 09/09/2020   Diabetes mellitus without complication (HCC) 04/07/2020   Hypercholesteremia 04/07/2020   History of prediabetes 03/14/2020   Orthostatic hypotension 01/11/2020   Insomnia 01/05/2020   Suicidal ideation 12/26/2019    Nonallopathic lesion of cervical region 11/26/2019   Nonallopathic lesion of thoracic region 11/26/2019   Nonallopathic lesion of sacral region 11/26/2019   Nonallopathic lesion of lumbosacral region 11/26/2019   Nonallopathic lesion of rib cage 11/26/2019   Encounter for medication review and counseling 11/13/2019   Abnormal LFTs 11/13/2019   Thrombocytosis 11/13/2019   Fibromyalgia 10/29/2019   Chronic back pain 10/18/2019   Hypothyroidism 10/18/2019   Depression    Gastroparesis    Chronic idiopathic urticaria 03/28/2019   Anxiety 2020   Rash of face 10/10/2017   S/P cholecystectomy 05/13/2016   Chronic constipation 01/06/2016   DISH (diffuse idiopathic skeletal hyperostosis) 01/06/2016   Fibrocystic breast disease 01/06/2016   Migraine without aura 01/06/2016   Recurrent cold sores 01/06/2016   Panic disorder without agoraphobia with mild panic attacks 01/06/2016   RLS (restless legs syndrome) 01/06/2016   Vertigo 01/06/2016   Vitamin B12 deficiency 01/06/2016   Vitamin D deficiency 01/06/2016   Lumbosacral radiculopathy at L5 11/25/2014   Right foot drop 11/25/2014   Elevated ferritin 07/07/2014   GAD (generalized anxiety disorder) 03/11/2014   MDD (major depressive disorder), recurrent episode, moderate (HCC) 03/11/2014   MDD (major depressive disorder), recurrent episode, severe (HCC) 01/09/2014   Allergic reaction to latex 01/07/2014   Rhinitis 01/07/2014   Myalgia and myositis, unspecified 12/17/2008   Essential hypertension 10/21/2008   Past Medical History:  Diagnosis Date   Anemia    Anxiety    Asthma    Chronic insomnia    Depression    Dermatitis    Gastroparesis    GERD (gastroesophageal reflux disease)    Hypertension    Hypothyroidism    Pre-diabetes    Psoriasis    Rhinitis    Family History  Problem Relation Age of Onset   Diabetes Mother    Heart attack Mother    Hyperlipidemia Father    Hypertension Father    Eczema Father    Breast  cancer Sister    Colon cancer Son    Past Surgical History:  Procedure Laterality Date   APPENDECTOMY     CHOLECYSTECTOMY  2017   EYE SURGERY     LUMBAR LAMINECTOMY/DECOMPRESSION MICRODISCECTOMY N/A 04/22/2021   Procedure: LAMINECTOMY AND FORAMINOTOMY Lumbar three-four, Lumbar four-five;  Surgeon: Van Gelinas, MD;  Location: [facility] OR;  Service: Neurosurgery;  Laterality: N/A;   Social History   Occupational History   Occupation: unemployed   Tobacco Use   Smoking status: Never   Smokeless tobacco: Never  Vaping Use   Vaping status: Never Used  Substance and Sexual Activity   Alcohol use: No   Drug use: No   Sexual activity: Yes    Comment: post-menopausa;

## 2024-03-31 LAB — VON WILLEBRAND FACTOR MULTIMER

## 2024-04-02 ENCOUNTER — Other Ambulatory Visit: Payer: Self-pay | Admitting: Internal Medicine

## 2024-04-02 ENCOUNTER — Telehealth: Payer: Self-pay | Admitting: Physical Medicine and Rehabilitation

## 2024-04-02 DIAGNOSIS — R928 Other abnormal and inconclusive findings on diagnostic imaging of breast: Secondary | ICD-10-CM

## 2024-04-02 NOTE — Telephone Encounter (Signed)
 Patient called and wanted to f/u on a referral to get scheduled. CB#[phone]

## 2024-04-05 NOTE — Progress Notes (Unsigned)
 [facility] [address] [place] [address] Phone: [phone] Subjective:   IBryan Caprio, am serving as a scribe for Dr. Ronnell Coins.  I'm seeing this patient by the request  of:  Melchor Spoon, MD  CC: Back and neck pain follow-up  ONG:[id number]  Meredith [name] is a 58 y.o. female coming in with complaint of back and neck pain. OMT on 02/26/2024. Patient states same per usual. No new symptoms.  Continues to have back pain on a regular basis.  Still waiting for parent to have the medial branch block.  Hopeful that we will be able to have that and then see if she is a candidate for a radiofrequency ablation before they travel up [place].  Medications patient has been prescribed:   Taking:         Reviewed prior external information including notes and imaging from previsou exam, outside providers and external EMR if available.   As well as notes that were available from care everywhere and other healthcare systems.  Past medical history, social, surgical and family history all reviewed in electronic medical record.  No pertanent information unless stated regarding to the chief complaint.   Past Medical History:  Diagnosis Date   Anemia    Anxiety    Asthma    Chronic insomnia    Depression    Dermatitis    Gastroparesis    GERD (gastroesophageal reflux disease)    Hypertension    Hypothyroidism    Pre-diabetes    Psoriasis    Rhinitis     Allergies  Allergen Reactions   Latex Rash   Shellfish Allergy Rash    Not all the time    Amoxicillin-Pot Clavulanate     unknown     Review of Systems:  No headache, visual changes, nausea, vomiting, diarrhea, constipation, dizziness, abdominal pain, skin rash, fevers, chills, night sweats, weight loss, swollen lymph nodes, body aches, joint swelling, chest pain, shortness of breath, mood changes. POSITIVE muscle aches, body aches  Objective  Blood pressure 116/84, pulse 97,  height 5' (1.524 m), weight 135 lb (61.2 kg), SpO2 98%.   General: No apparent distress alert and oriented x3 mood and affect normal, dressed appropriately.  HEENT: Pupils equal, extraocular movements intact  Respiratory: Patient's speak in full sentences and does not appear short of breath   pain out of proportion noted.  Tightness with Veldon German right greater than left.  Osteopathic findings  C3 flexed rotated and side bent right C6 flexed rotated and side bent left T3 extended rotated and side bent right inhaled rib T9 extended rotated and side bent left L1 flexed rotated and side bent right L3 flexed rotated and side bent left Sacrum right on right       Assessment and Plan:  Spinal stenosis, lumbar region, with neurogenic claudication Known spinal stenosis, has been referred for different injections.  We are going to try medial branch blocks and see with patient having mostly now what appears to be facet arthropathy and discomfort would like to see if anything would help patient respond at the moment.  No other significant changes in the medications.  Patient is on multiple ones already including muscle relaxers.  Discussed icing regimen and home exercises, increase activity slowly.  Follow-up again in 6 to 8 weeks.    Nonallopathic problems  Decision today to treat with OMT was based on Physical Exam  After verbal consent patient was treated with  ME, FPR techniques in cervical, rib, thoracic, lumbar, and sacral  areas  Patient tolerated the procedure well with improvement in symptoms  Patient given exercises, stretches and lifestyle modifications  See medications in patient instructions if given  Patient will follow up in 4-8 weeks    The above documentation has been reviewed and is accurate and complete Meredith Margo, DO          Note: This dictation was prepared with Dragon dictation along with smaller phrase technology. Any transcriptional errors that result  from this process are unintentional.

## 2024-04-08 ENCOUNTER — Encounter: Payer: Self-pay | Admitting: Family Medicine

## 2024-04-08 ENCOUNTER — Ambulatory Visit (INDEPENDENT_AMBULATORY_CARE_PROVIDER_SITE_OTHER): Admitting: Family Medicine

## 2024-04-08 VITALS — BP 116/84 | HR 97 | Ht 60.0 in | Wt 135.0 lb

## 2024-04-08 DIAGNOSIS — M9902 Segmental and somatic dysfunction of thoracic region: Secondary | ICD-10-CM

## 2024-04-08 DIAGNOSIS — M9903 Segmental and somatic dysfunction of lumbar region: Secondary | ICD-10-CM

## 2024-04-08 DIAGNOSIS — M9901 Segmental and somatic dysfunction of cervical region: Secondary | ICD-10-CM | POA: Diagnosis not present

## 2024-04-08 DIAGNOSIS — M9904 Segmental and somatic dysfunction of sacral region: Secondary | ICD-10-CM | POA: Diagnosis not present

## 2024-04-08 DIAGNOSIS — M48062 Spinal stenosis, lumbar region with neurogenic claudication: Secondary | ICD-10-CM | POA: Diagnosis not present

## 2024-04-08 DIAGNOSIS — M9908 Segmental and somatic dysfunction of rib cage: Secondary | ICD-10-CM | POA: Diagnosis not present

## 2024-04-08 NOTE — Assessment & Plan Note (Signed)
 Known spinal stenosis, has been referred for different injections.  We are going to try medial branch blocks and see with patient having mostly now what appears to be facet arthropathy and discomfort would like to see if anything would help patient respond at the moment.  No other significant changes in the medications.  Patient is on multiple ones already including muscle relaxers.  Discussed icing regimen and home exercises, increase activity slowly.  Follow-up again in 6 to 8 weeks.

## 2024-04-09 ENCOUNTER — Encounter: Payer: Self-pay | Admitting: Physical Medicine and Rehabilitation

## 2024-04-09 ENCOUNTER — Ambulatory Visit
Admission: RE | Admit: 2024-04-09 | Discharge: 2024-04-09 | Disposition: A | Discharge status: Home / Self Care | Source: Ambulatory Visit | Attending: Internal Medicine | Admitting: Internal Medicine

## 2024-04-09 DIAGNOSIS — R928 Other abnormal and inconclusive findings on diagnostic imaging of breast: Secondary | ICD-10-CM

## 2024-04-09 DIAGNOSIS — D242 Benign neoplasm of left breast: Secondary | ICD-10-CM | POA: Diagnosis present

## 2024-04-09 HISTORY — PX: BREAST BIOPSY: SHX20

## 2024-04-09 MED ORDER — LIDOCAINE 1 % OPTIME INJ - NO CHARGE
2.0000 mL | Freq: Once | INTRAMUSCULAR | Status: AC
  Administered 2024-04-09: 2 mL
  Filled 2024-04-09: qty 2

## 2024-04-09 MED ORDER — LIDOCAINE-EPINEPHRINE 1 %-1:100000 IJ SOLN
8.0000 mL | Freq: Once | INTRAMUSCULAR | Status: AC
  Administered 2024-04-09: 8 mL

## 2024-04-10 LAB — SURGICAL PATHOLOGY

## 2024-04-23 ENCOUNTER — Other Ambulatory Visit: Payer: Self-pay

## 2024-04-23 ENCOUNTER — Ambulatory Visit (INDEPENDENT_AMBULATORY_CARE_PROVIDER_SITE_OTHER): Admitting: Physical Medicine and Rehabilitation

## 2024-04-23 VITALS — BP 129/76 | HR 87

## 2024-04-23 DIAGNOSIS — M47816 Spondylosis without myelopathy or radiculopathy, lumbar region: Secondary | ICD-10-CM | POA: Diagnosis not present

## 2024-04-23 DIAGNOSIS — M47819 Spondylosis without myelopathy or radiculopathy, site unspecified: Secondary | ICD-10-CM | POA: Diagnosis not present

## 2024-04-23 MED ORDER — BUPIVACAINE HCL 0.5 % IJ SOLN
3.0000 mL | Freq: Once | INTRAMUSCULAR | Status: AC
  Administered 2024-04-23: 3 mL

## 2024-04-23 NOTE — Progress Notes (Signed)
 Meredith [name] - 58 y.o. female MRN [id number]  Date of birth: 03/26/1966  Office Visit Note: Visit Date: 04/23/2024 PCP: Fernande Ophelia JINNY DOUGLAS, MD Referred by: Fernande Ophelia JINNY DOUGLAS, MD  Subjective: Chief Complaint  Patient presents with   Lower Back - Pain   HPI:  Meredith [name] is a 58 y.o. female who comes in today at the request of Duwaine Pouch, FNP for planned Bilateral  L4-5 Lumbar facet/medial branch block with fluoroscopic guidance.  The patient has failed conservative care including home exercise, medications, time and activity modification.  This injection will be diagnostic and hopefully therapeutic.  Please see requesting physician notes for further details and justification.  Exam has shown concordant pain with facet joint loading.  They are going on a trip starting tomorrow to the [place] for [place] months.  Depending on the relief of the diagnostic blocks we will repeat a second block with a return in October.  Ultimate goal is radiofrequency ablation.  She has met all the criteria otherwise.  Please see our prior notes.   ROS Otherwise per HPI.  Assessment & Plan: Visit Diagnoses:    ICD-10-CM   1. Spondylosis without myelopathy or radiculopathy  M47.819 XR C-ARM NO REPORT    Nerve Block    bupivacaine  (MARCAINE ) 0.5 % (with pres) injection 3 mL      Plan: No additional findings.   Meds & Orders:  Meds ordered this encounter  Medications   bupivacaine  (MARCAINE ) 0.5 % (with pres) injection 3 mL    Orders Placed This Encounter  Procedures   Nerve Block   XR C-ARM NO REPORT    Follow-up: Return for Review Pain Diary.   Procedures: No procedures performed  Lumbar Diagnostic Facet Joint Nerve Block with Fluoroscopic Guidance   Patient: Meredith [name]      Date of Birth: 1966/09/02 MRN: [id number] PCP: Fernande Ophelia JINNY DOUGLAS, MD      Visit Date: 04/23/2024   Universal Protocol:    Date/Time: 07/01/254:44 PM  Consent Given By: the patient  Position:  PRONE  Additional Comments: Vital signs were monitored before and after the procedure. Patient was prepped and draped in the usual sterile fashion. The correct patient, procedure, and site was verified.   Injection Procedure Details:   Procedure diagnoses:  1. Spondylosis without myelopathy or radiculopathy      Meds Administered:  Meds ordered this encounter  Medications   bupivacaine  (MARCAINE ) 0.5 % (with pres) injection 3 mL     Laterality: Bilateral  Location/Site: L4-L5, L3 and L4 medial branches  Needle: 5.0 in., 25 ga.  Short bevel or Quincke spinal needle  Needle Placement: Oblique pedical  Findings:   -Comments: There was excellent flow of contrast along the articular pillars without intravascular flow.  Procedure Details: The fluoroscope beam is vertically oriented in AP and then obliqued 15 to 20 degrees to the ipsilateral side of the desired nerve to achieve the "Scotty dog" appearance.  The skin over the target area of the junction of the superior articulating process and the transverse process (sacral ala if blocking the L5 dorsal rami) was locally anesthetized with a 1 ml volume of 1% Lidocaine  without Epinephrine .  The spinal needle was inserted and advanced in a trajectory view down to the target.   After contact with periosteum and negative aspirate for blood and CSF, correct placement without intravascular or epidural spread was confirmed by injecting 0.5 ml. of Isovue -250.  A spot radiograph was obtained of this  image.    Next, a 0.5 ml. volume of the injectate described above was injected. The needle was then redirected to the other facet joint nerves mentioned above if needed.  Prior to the procedure, the patient was given a Pain Diary which was completed for baseline measurements.  After the procedure, the patient rated their pain every 30 minutes and will continue rating at this frequency for a total of 5 hours.  The patient has been asked to complete  the Diary and return to us  by mail, fax or hand delivered as soon as possible.   Additional Comments:  The patient tolerated the procedure well Dressing: 2 x 2 sterile gauze and Band-Aid    Post-procedure details: Patient was observed during the procedure. Post-procedure instructions were reviewed.  Patient left the clinic in stable condition.   Clinical History: CLINICAL DATA:  Low back pain radiating down the left leg over the last 3 months.   EXAM: MRI LUMBAR SPINE WITHOUT CONTRAST   TECHNIQUE: Multiplanar, multisequence MR imaging of the lumbar spine was performed. No intravenous contrast was administered.   COMPARISON:  Radiography 03/16/2022.  MRI 08/30/2021.   FINDINGS: Segmentation:  5 lumbar type vertebral bodies.   Alignment:  Normal   Vertebrae: No fracture or edematous lesion. Chronic benign hemangioma within the L3 vertebral body.   Conus medullaris and cauda equina: Conus extends to the L1 level. Conus and cauda equina appear normal.   Paraspinal and other soft tissues: Negative   Disc levels:   T12-L1: Shallow disc protrusion. No compressive stenosis. No change.   L1-2: Minimal disc bulge.  No stenosis.  No change.   L2-3: Shallow disc protrusion. Slight indentation of the thecal sac. No stenosis. No change.   L3-4: Previous posterior decompression. Shallow disc protrusion. Slight indentation of the thecal sac. No compressive stenosis. No change.   L4-5: Previous posterior decompression. Shallow protrusion of the disc, minimally more prominent. Mild facet hypertrophy. Stenosis of the subarticular lateral recesses left more than right that could cause neural compression, particularly on the left. Edematous change of the facet joints that could be symptomatic.   L5-S1: Mild bulging of the disc. Mild facet degeneration and hypertrophy. No compressive stenosis.   IMPRESSION: Previous posterior decompression at L3-4 and L4-5. No  compressive stenosis at L3-4. At L4-5, there is shallow protrusion of the disc which is minimally more prominent. There is bilateral facet hypertrophy worse on the left than the right. There is stenosis of the lateral recesses worse on the left than the right that could cause neural compression.   Chronic, grossly non-compressive degenerative changes at the other levels as outlined above.     Electronically Signed   By: Oneil Officer M.D.   On: 03/28/2022 16:36     Objective:  VS:  HT:    WT:   BMI:     BP:129/76  HR:87bpm  TEMP: ( )  RESP:  Physical Exam Vitals and nursing note reviewed.  Constitutional:      General: She is not in acute distress.    Appearance: Normal appearance. She is not ill-appearing.  HENT:     Head: Normocephalic and atraumatic.     Right Ear: External ear normal.     Left Ear: External ear normal.   Eyes:     Extraocular Movements: Extraocular movements intact.    Cardiovascular:     Rate and Rhythm: Normal rate.     Pulses: Normal pulses.  Pulmonary:     Effort:  Pulmonary effort is normal. No respiratory distress.  Abdominal:     General: There is no distension.     Palpations: Abdomen is soft.   Musculoskeletal:        General: Tenderness present.     Cervical back: Neck supple.     Right lower leg: No edema.     Left lower leg: No edema.     Comments: Patient has good distal strength with no pain over the greater trochanters.  No clonus or focal weakness.   Skin:    Findings: No erythema, lesion or rash.   Neurological:     General: No focal deficit present.     Mental Status: She is alert and oriented to person, place, and time.     Sensory: No sensory deficit.     Motor: No weakness or abnormal muscle tone.     Coordination: Coordination normal.   Psychiatric:        Mood and Affect: Mood normal.        Behavior: Behavior normal.      Imaging: XR C-ARM NO REPORT Result Date: 04/23/2024 Please see Notes tab for  imaging impression.

## 2024-04-23 NOTE — Patient Instructions (Signed)

## 2024-04-23 NOTE — Procedures (Signed)
 Lumbar Diagnostic Facet Joint Nerve Block with Fluoroscopic Guidance   Patient: Meredith [name]      Date of Birth: [birth year] MRN: [id number] PCP: Fernande Ophelia JINNY DOUGLAS, MD      Visit Date: 04/23/2024   Universal Protocol:    Date/Time: 07/01/254:44 PM  Consent Given By: the patient  Position: PRONE  Additional Comments: Vital signs were monitored before and after the procedure. Patient was prepped and draped in the usual sterile fashion. The correct patient, procedure, and site was verified.   Injection Procedure Details:   Procedure diagnoses:  1. Spondylosis without myelopathy or radiculopathy      Meds Administered:  Meds ordered this encounter  Medications   bupivacaine  (MARCAINE ) 0.5 % (with pres) injection 3 mL     Laterality: Bilateral  Location/Site: L4-L5, L3 and L4 medial branches  Needle: 5.0 in., 25 ga.  Short bevel or Quincke spinal needle  Needle Placement: Oblique pedical  Findings:   -Comments: There was excellent flow of contrast along the articular pillars without intravascular flow.  Procedure Details: The fluoroscope beam is vertically oriented in AP and then obliqued 15 to 20 degrees to the ipsilateral side of the desired nerve to achieve the "Scotty dog" appearance.  The skin over the target area of the junction of the superior articulating process and the transverse process (sacral ala if blocking the L5 dorsal rami) was locally anesthetized with a 1 ml volume of 1% Lidocaine  without Epinephrine .  The spinal needle was inserted and advanced in a trajectory view down to the target.   After contact with periosteum and negative aspirate for blood and CSF, correct placement without intravascular or epidural spread was confirmed by injecting 0.5 ml. of Isovue -250.  A spot radiograph was obtained of this image.    Next, a 0.5 ml. volume of the injectate described above was injected. The needle was then redirected to the other facet joint nerves mentioned  above if needed.  Prior to the procedure, the patient was given a Pain Diary which was completed for baseline measurements.  After the procedure, the patient rated their pain every 30 minutes and will continue rating at this frequency for a total of 5 hours.  The patient has been asked to complete the Diary and return to us  by mail, fax or hand delivered as soon as possible.   Additional Comments:  The patient tolerated the procedure well Dressing: 2 x 2 sterile gauze and Band-Aid    Post-procedure details: Patient was observed during the procedure. Post-procedure instructions were reviewed.  Patient left the clinic in stable condition.

## 2024-04-23 NOTE — Progress Notes (Signed)
 Pain Scale   Average Pain 8 Patient advising she has chronic lower back pain radiating to both legs. Patient advising her pain increases when she is sitting of bending. Patient advising that her pain lessens when she lays down.      +Driver, -BT, -Dye Allergies.

## 2024-05-15 ENCOUNTER — Other Ambulatory Visit: Payer: Self-pay | Admitting: Dermatology

## 2024-05-30 ENCOUNTER — Encounter: Payer: Self-pay | Admitting: Internal Medicine

## 2024-06-03 ENCOUNTER — Telehealth: Payer: Self-pay | Admitting: Internal Medicine

## 2024-06-03 ENCOUNTER — Other Ambulatory Visit: Payer: Self-pay | Admitting: *Deleted

## 2024-06-03 DIAGNOSIS — D6800 Von Willebrand disease, unspecified: Secondary | ICD-10-CM

## 2024-06-03 NOTE — Telephone Encounter (Signed)
 Spoke to patient's husband-regarding results of the workup-suggestive of von Willebrand 2 M subtype.  Recommend genetic testing for further workup.  Please schedule for lab- sep 30th-  # follow up on October 24th- MD- no labs- GB  Please schedule appts as requested-   GB

## 2024-06-04 ENCOUNTER — Telehealth: Payer: Self-pay | Admitting: Internal Medicine

## 2024-06-04 NOTE — Telephone Encounter (Signed)
 Please also make an MD appt with her 9/30 labs- appt.   GB

## 2024-07-17 ENCOUNTER — Other Ambulatory Visit: Payer: Self-pay | Admitting: Internal Medicine

## 2024-07-17 MED ORDER — SODIUM CHLORIDE 0.9 % IV SOLN: 18.0000 ug | Freq: Once | INTRAVENOUS | Status: DC

## 2024-07-17 NOTE — Progress Notes (Signed)
 DDAVP needs to be scheduled for 2 hrs chair time to be sure the 1 hr post lab was drawn on time, then scheduled to come back to the lab for the 4 hr post infusion lab  IV route (most common for testing): 0.3 g/kg (maximum 20 g) diluted in 50-100 mL normal saline.   Check baseline VWF:Ag, VWF activity, FVIII:C.  Recheck at 1h and 4h.  --------------------   I spoke to husband/and patient-regarding the need for desmopressin challenge-however they are out of town.  Will be back on September 29th-will try to schedule the above protocol early next week.  -------------------

## 2024-07-23 ENCOUNTER — Inpatient Hospital Stay: Admitting: Internal Medicine

## 2024-07-23 ENCOUNTER — Other Ambulatory Visit: Payer: Self-pay | Admitting: *Deleted

## 2024-07-23 ENCOUNTER — Other Ambulatory Visit: Payer: Self-pay | Admitting: Internal Medicine

## 2024-07-23 ENCOUNTER — Other Ambulatory Visit

## 2024-07-23 DIAGNOSIS — D6800 Von Willebrand disease, unspecified: Secondary | ICD-10-CM

## 2024-07-23 NOTE — Progress Notes (Signed)
#   Labs to be done prior to infusion: PTT-von Willebrand panel ordered-   # Please order the following: # 1 hour after infusion labs :PTT-von Willebrand panel   # 4 hours after infusion labs: PTT-von Willebrand panel

## 2024-07-24 ENCOUNTER — Other Ambulatory Visit

## 2024-07-24 ENCOUNTER — Ambulatory Visit

## 2024-07-25 ENCOUNTER — Inpatient Hospital Stay

## 2024-07-25 ENCOUNTER — Other Ambulatory Visit: Payer: Self-pay

## 2024-07-25 ENCOUNTER — Ambulatory Visit: Admitting: Physical Medicine and Rehabilitation

## 2024-07-25 ENCOUNTER — Other Ambulatory Visit

## 2024-07-25 ENCOUNTER — Inpatient Hospital Stay: Attending: Internal Medicine

## 2024-07-25 VITALS — BP 129/80 | HR 88

## 2024-07-25 DIAGNOSIS — D6800 Von Willebrand disease, unspecified: Secondary | ICD-10-CM

## 2024-07-25 DIAGNOSIS — D68022 Von Willebrand disease, type 2m: Secondary | ICD-10-CM | POA: Insufficient documentation

## 2024-07-25 DIAGNOSIS — M47816 Spondylosis without myelopathy or radiculopathy, lumbar region: Secondary | ICD-10-CM | POA: Diagnosis not present

## 2024-07-25 DIAGNOSIS — Z79899 Other long term (current) drug therapy: Secondary | ICD-10-CM | POA: Insufficient documentation

## 2024-07-25 LAB — APTT
aPTT: 33 s (ref 24–36)
aPTT: 27 s (ref 24–36)
aPTT: 28 s (ref 24–36)

## 2024-07-25 MED ORDER — SODIUM CHLORIDE 0.9 % IV SOLN
18.0000 ug | Freq: Once | INTRAVENOUS | Status: AC
  Administered 2024-07-25: 18 ug via INTRAVENOUS
  Filled 2024-07-25 (×2): qty 4.5

## 2024-07-25 MED ORDER — SODIUM CHLORIDE 0.9 % IV SOLN
Freq: Once | INTRAVENOUS | Status: AC
  Filled 2024-07-25: qty 250

## 2024-07-25 MED ORDER — BUPIVACAINE HCL 0.5 % IJ SOLN
3.0000 mL | Freq: Once | INTRAMUSCULAR | Status: AC
  Administered 2024-07-25: 3 mL

## 2024-07-25 NOTE — Progress Notes (Addendum)
 1100: Pt reports that eyes appear more red than normal. Pt denies any vision changes or different symptoms. Pharmacy and Dr. Rennie aware.  Per Dr. Rennie pt educated to only drink 16oz of water today and okay to drink Gatorade. Pt verbalizes understanding. Pt educated to call if other symptoms arise including vision changes, pt verbalizes understanding. Pt stable.

## 2024-07-25 NOTE — Progress Notes (Signed)
 Pain Scale   Average Pain 7  Patient advising she has lower back pain radiating bilateral down legs. Patient advising that her pain increases when standing and walking and decreases when laying down        +Driver, -BT, -Dye Allergies.

## 2024-07-25 NOTE — Progress Notes (Signed)
 Document Type:Discharge InstructionDocument Type:Discharge InstructionGASTROENTEROLOGY ESTABLISHED PATIENT VISIT   Patient Profile: Meredith [name] is a 58 y.o. female who presents for a return visit to the GI clinic for follow up of GERD  PCP:  Fernande Ophelia Marinell DOUGLAS, MD  Brief History   Meredith [name] is established with [facility] for the management of the above. All prior office notes, lab results, imaging studies, and procedure reports reviewed as appropriate.  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 Akdamar - 04/16/2018 - FINDINGS: Patient consumed 100% of meal in 8 minutes. One hour gastric emptying is 22%. 4 hour gastric emptying is 38%. 4 hour normal is 90% or greater.   GI Medications: Current: VOQUEZNA 20mg  daily Prior: Interval History   Ms. Favorite  History of Present Illness Meredith [name] is a 58 year old female with gastroparesis and reflux who presents for a routine follow-up and colonoscopy scheduling.  She has been managing her gastroparesis well, with no recent exacerbations. She continues to experience occasional reflux symptoms, though they are less severe than before. She is on medication for reflux, but the specific medication and dosage were not mentioned.  She is due for a colonoscopy, as her last procedure was in 2019.  She has von Willebrand's disease and recently underwent testing at the oncology center to evaluate the efficacy of a specific medication for managing her condition during surgeries or dental procedures. The test was conducted to ensure proper clotting.  She experiences occasional constipation and diarrhea, indicating irregular bowel habits.  Her social history includes recent travel to the [place] and an upcoming change in  her disability status due to her age, which will affect her Social Security benefits.  Assessment & Plan Colorectal cancer surveillance in patient with history of adenomatous and serrated colon polyps Due for a colonoscopy as the last one was in 2019. No major cardiac or pulmonary contraindications for the procedure. Discussed rare complications such as bleeding, infection, and perforation, emphasizing her low likelihood. - Schedule colonoscopy at the outpatient center at [facility].  Gastroesophageal reflux disease with esophagitis Intermittent reflux symptoms persist but are not severe. Continues prescribed medication.  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  Constipation Reports occasional constipation and diarrhea.  Von Willebrand disease (peri-procedural management) Testing at the oncology center assessed medication efficacy for surgical procedures. No specific peri-procedural management required for colonoscopy as it is not a surgical procedure.  Recording duration: 6 minutes    Problem List:  Problem List  Date Reviewed: 03/13/2024        Noted   H/O adenomatous polyp of colon 07/25/2024   Von Willebrand disease (CMS/HHS-HCC) 03/13/2024   Psoriatic arthritis (CMS/HHS-HCC) 03/12/2024   Pharyngeal dysphagia 07/06/2023   Erosive esophagitis 02/14/2023   Fibromyalgia 12/28/2020   Spinal stenosis, lumbar region, with neurogenic claudication 12/10/2020   Overview  Formatting of this note might be different from the original. Tramadol  prescribed December 10, 2020  Last Assessment & Plan:  Formatting of this note might be different from the original. Severe spinal stenosis with patient having surgical intervention.  Patient is making progress.  Avoided HVLA but did do muscle energy.  Patient did improve.  Patient also does not have as much foot drop as previously.  Patient is  making some progress and encouraged her to continue to  do the exercises regularly.  Patient does have medications including the Cymbalta , Flexeril , and gabapentin .  Patient will follow up with me again in 6 weeks      PMB (postmenopausal bleeding) Unknown   Bilateral carpal tunnel syndrome 10/30/2020   Carpal tunnel syndrome 10/28/2020   Overview  Last Assessment & Plan:  Formatting of this note might be different from the original. Patient had an EMG and will be having an injection by another provider.      Tremor 09/09/2020   Headache disorder 09/09/2020   Chronic constipation 08/20/2020   Irritable bowel syndrome with constipation 08/20/2020   Gastroparesis diabeticorum (CMS/HHS-HCC) 05/28/2020   Overview  Prior abnormal gastric emptying study.  Followed by GI.  Evaluated by Dr. Unk 2021      NAFLD (nonalcoholic fatty liver disease) 10/28/7976   Diabetes mellitus type 2 with complications (CMS/HHS-HCC) 04/07/2020   Hypercholesteremia 04/07/2020   Chronic back pain 10/18/2019   Hypothyroidism 10/18/2019   Anemia, iron deficiency 01/06/2016   Anxiety 01/06/2016   DISH (diffuse idiopathic skeletal hyperostosis) 01/06/2016   Vitamin D deficiency 01/06/2016   Lumbosacral radiculopathy at L5 11/25/2014   Overview  Last Assessment & Plan:  Formatting of this note might be different from the original. Patient continues to have radicular symptoms.  Does have leg cramping.  Patient severity of pain is 8 out of 10.  Patient does have this affecting daily activities even walking greater than 200 feet with increasing discomfort and pain.  Patient is to continue the Cymbalta  as well as the gabapentin  but I do feel injections will be beneficial in this individual.  We will attempt a L4-L5 epidural injection and that will be scheduled at a later date follow-up with me in 3 to 4 weeks after the injection      MDD (major depressive disorder), recurrent episode, moderate (CMS-HCC) 03/11/2014   GERD (gastroesophageal reflux disease) 12/20/2012   Essential  hypertension 10/21/2008    Medications:  Current Outpatient Medications  Medication Sig Dispense Refill  . albuterol MDI, PROVENTIL, VENTOLIN, PROAIR, HFA 90 mcg/actuation inhaler INHALE 2 INHALATIONS BY MOUTH  EVERY 6 HOURS AS NEEDED FOR  WHEEZING 34 g 11  . BIOTIN ORAL Take by mouth once daily    . cetirizine (ZYRTEC) 10 MG tablet Take by mouth once daily as needed    . cholecalciferol (VITAMIN D3) 1000 unit tablet Take by mouth    . cyanocobalamin  (VITAMIN B12) 1,000 mcg/mL injection Inject 1 ml into the muscle every 30 days 10 mL 6  . cyclobenzaprine  (FLEXERIL ) 10 MG tablet Take 1 tablet (10 mg total) by mouth 2 (two) times daily as needed for Muscle spasms 180 tablet 1  . dexlansoprazole (DEXILANT) 60 mg DR capsule TAKE 1 CAPSULE BY MOUTH ONCE  DAILY 1/2 HOUR PRIOR TO  BREAKFAST 90 capsule 3  . DULoxetine  (CYMBALTA ) 60 MG DR capsule TAKE 1 CAPSULE BY MOUTH TWICE  DAILY 180 capsule 3  . gabapentin  (NEURONTIN ) 100 MG capsule Take gabapentin  to 400 mg in the morning, 400 mg in the afternoon, 900 mg at night for 2 weeks, then increase to 600 mg in the morning, 600 mg in the afternoon, 900 mg at night and continue this dose (Patient taking differently: 600 mg 2 (two) times daily Take gabapentin  to 600 mg in the morning, 600 mg in the afternoon, 900 mg at night) 90 capsule 11  . glipiZIDE (GLUCOTROL XL) 5 MG XL  tablet TAKE 1 TABLET BY MOUTH ONCE  DAILY 100 tablet 2  . levothyroxine  (SYNTHROID ) 125 MCG tablet TAKE 1 TABLET BY MOUTH DAILY ON  AN EMPTY STOMACH WITH A FULL  GLASS OF WATER AT LEAST 1/2 HOUR TO 1 HOUR BEFORE BREAKFAST 100 tablet 2  . losartan  (COZAAR ) 25 MG tablet TAKE 1 TABLET BY MOUTH AT  BEDTIME 100 tablet 2  . meclizine (ANTIVERT) 25 MG tablet Take 1 tablet (25 mg total) by mouth 3 (three) times daily as needed 30 tablet 11  . metoprolol SUCCinate (TOPROL-XL) 100 MG XL tablet TAKE 1 AND 1/2 TABLETS BY MOUTH  ONCE DAILY 150 tablet 2  . multivitamin tablet Take by mouth    .  nortriptyline (PAMELOR) 10 MG capsule TAKE 4 CAPSULES BY MOUTH AT  BEDTIME 120 capsule 0  . polyethylene glycol (MIRALAX) packet Take 1 packet (17 g total) by mouth once daily Mix in 4-8ounces of fluid prior to taking. 30 packet 1  . secukinumab (COSENTYX UNOREADY PEN) 300 mg/2 mL PnIj Inject 300 mg subcutaneously every 28 (twenty-eight) days 6 mL 1  . SKYRIZI 150 mg/mL subcutaneous pen injector Inject subcutaneously once    . syringe with needle (BD LUER-LOK SYRINGE) 3 mL 25 gauge x 1 Syrg Use 1 syringe with needle monthly with B-1 2injections    . tacrolimus  (PROTOPIC ) 0.1 % ointment Apply to affected areas on Nose, fingertips and to B/L lower legs    . valACYclovir  (VALTREX ) 500 MG tablet Take 500 mg by mouth 2 (two) times daily    . zolpidem  (AMBIEN  CR) 12.5 MG CR tablet TAKE 1 TABLET BY MOUTH AT BEDTIME AS NEEDED FOR SLEEP 90 tablet 1  . sodium, potassium, and magnesium (SUPREP) oral solution Take 1 Bottle by mouth as directed One kit contains 2 bottles.  Take both bottles at the times instructed by your provider. 354 mL 0   No current facility-administered medications for this visit.     Allergies:  Shellfish containing products; Amoxicillin; and Latex, natural rubber  Past Medical History:  Past Medical History:  Diagnosis Date  . Anemia, iron deficiency 01/06/2016  . Carpal tunnel syndrome 08/25/2021   Right by Dr. Kathlynn  . Diabetes mellitus without complication (CMS/HHS-HCC) 04/07/2020  . Essential hypertension 10/21/2008  . Gastroparesis due to DM  (CMS/HHS-HCC) 05/28/2020   Prior abnormal gastric emptying study.  Followed by GI.  Evaluated by Dr. Unk 2021  . GERD (gastroesophageal reflux disease) 12/20/2012  . Hypercholesteremia 04/07/2020  . Hypothyroidism 10/18/2019  . NAFLD (nonalcoholic fatty liver disease) 91/95/7978    Social History:  Social History   Socioeconomic History  . Marital status: Widowed  Tobacco Use  . Smoking status: Never    Passive  exposure: Never  . Smokeless tobacco: Never  Vaping Use  . Vaping status: Never Used  Substance and Sexual Activity  . Alcohol use: Not Currently  . Drug use: Never  . Sexual activity: Yes    Partners: Male    Birth control/protection: Post-menopausal   Social Drivers of Health   Financial Resource Strain: Low Risk  (03/13/2024)   Overall Financial Resource Strain (CARDIA)   . Difficulty of Paying Living Expenses: Not hard at all  Food Insecurity: No Food Insecurity (03/13/2024)   Hunger Vital Sign   . Worried About Programme researcher, broadcasting/film/video in the Last Year: Never true   . Ran Out of Food in the Last Year: Never true  Transportation Needs: No Transportation Needs (03/13/2024)  PRAPARE - Transportation   . Lack of Transportation (Medical): No   . Lack of Transportation (Non-Medical): No    Family History:  Family History  Problem Relation Name Age of Onset  . Arthritis Sister       Review of Systems   General: negative for - fever, chills, weight gain, weight loss, fatigue, weakness, depression, anxiety Head: no injury, migraines, headaches Eyes: no jaundice, itching, dryness, tearing, redness, vision changes Nose: no injury, bleeding Mouth/Throat: no oral ulcers, swollen neck, dry mouth, sore throat, hoarseness Endocrine: no heat/cold intolerance Respiratory: no cough, wheezing, SOB Cardiovascular: no chest pain, palpitations GI: see HPI Musculoskeletal: no joint swelling, muscle/joint pain  Neurological: no seizures, syncope, dizziness, numbness/tingling Skin: no rashes, itching Hematological and Lymphatic: no easy bruising, easy bleeding   Physical Examination   Vitals:   07/25/24 1420  BP: 123/78  Pulse: 94  Temp: 36.4 C (97.5 F)   Body mass index is 26.57 kg/m. Weight: 63.8 kg (140 lb 9.6 oz)   Wt Readings from Last 3 Encounters:  07/25/24 63.8 kg (140 lb 9.6 oz)  03/28/24 59 kg (130 lb)  03/15/24 59.4 kg (131 lb)    General Appearance:    Patient  in no distress, sitting comfortably on exam room bench.   Head:     Atraumatic, normocephalic  Eyes:   Anicteric  Neck:   No lymphadenopathy noted, symmetrical, no JVD  Mouth:   Lips, mucosa, and tongue normal;   Lungs:     Clear to auscultation bilaterally, no wheezes/crackles/rales   Heart:    Regular rate and rhythm, S1 and S2 normal, no murmur, rub   or gallop  Abdomen:    Rectal:     Soft, non-tender, bowel sounds active all four quadrants,       no rebound/guarding, no masses, no organomegaly.      Deferred  Extremities:   Extremities normal, atraumatic, no cyanosis or edema  Skin:   No abdominal rashes or lesions noted   Neurologic:   Moves all extremities well. Alert and oriented x 3.   Review of Data   I have personally reviewed and interpreted the following data today:  Provider notes:   Labs: Lab Results  Component Value Date   WBC 7.5 03/06/2024   HGB 13.8 03/06/2024   HCT 42.7 03/06/2024   MCV 81.8 03/06/2024   PLT 375 03/06/2024   @LABRCNTIP (hgb:3)@ Lab Results  Component Value Date   NA 140 03/06/2024   K 4.3 03/06/2024   CL 101 03/06/2024   CO2 31.8 03/06/2024   BUN 11 03/06/2024   CREATININE 0.6 03/06/2024   Lab Results  Component Value Date   ALT 25 03/06/2024   AST 29 03/06/2024   ALKPHOS 102 03/06/2024   @LABRCNTIP (aptt,inr,ptt)@  Imaging:    Procedures:   Also see HPI.  Assessment/Plan   Ms. Bisch is a 58 y.o. female with a PMHx of DM was seen today for follow up of:    Orders   Diagnoses and all orders for this visit:  H/O adenomatous polyp of colon -     Ambulatory Referral to Colonoscopy  Diabetes mellitus type 2 with complications (CMS/HHS-HCC)  Gastroparesis diabeticorum (CMS/HHS-HCC)  Essential hypertension  Erosive esophagitis  NAFLD (nonalcoholic fatty liver disease)  Chronic constipation  Psoriatic arthritis (CMS/HHS-HCC)  Gastroesophageal reflux disease with esophagitis without hemorrhage  Other  orders -     sodium, potassium, and magnesium (SUPREP) oral solution; Take 1 Bottle by mouth as directed  One kit contains 2 bottles.  Take both bottles at the times instructed by your provider.  Assessment & Plan Colorectal cancer surveillance in patient with history of adenomatous and serrated colon polyps Due for a colonoscopy as the last one was in 2019. No major cardiac or pulmonary contraindications for the procedure. Discussed rare complications such as bleeding, infection, and perforation, emphasizing her low likelihood. - Schedule colonoscopy at the outpatient center at [facility].  Gastroesophageal reflux disease with esophagitis Intermittent reflux symptoms persist but are not severe. Continues prescribed medication.  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  Constipation Reports occasional constipation and diarrhea.  Von Willebrand disease (peri-procedural management) Testing at the oncology center assessed medication efficacy for surgical procedures. No specific peri-procedural management required for colonoscopy as it is not a surgical procedure.  Recording duration: 6 minutes   Return for COLONOSCOPY.     IVAR Francis Boss, M.D. ABIM Diplomate in Gastroenterology [facility] [address] [place], [place] [address] [phone]   This note will be shared with the patient. To hide, you must now click the Share button.

## 2024-07-26 ENCOUNTER — Encounter: Payer: Self-pay | Admitting: Physical Medicine and Rehabilitation

## 2024-07-26 ENCOUNTER — Other Ambulatory Visit: Payer: Self-pay | Admitting: Physical Medicine and Rehabilitation

## 2024-07-26 DIAGNOSIS — M47819 Spondylosis without myelopathy or radiculopathy, site unspecified: Secondary | ICD-10-CM

## 2024-07-26 DIAGNOSIS — M47816 Spondylosis without myelopathy or radiculopathy, lumbar region: Secondary | ICD-10-CM

## 2024-07-26 DIAGNOSIS — G8929 Other chronic pain: Secondary | ICD-10-CM

## 2024-07-30 LAB — VON WILLEBRAND PANEL
Coagulation Factor VIII: 71 % (ref 56–140)
Coagulation Factor VIII: 162 % — ABNORMAL HIGH (ref 56–140)
Coagulation Factor VIII: 126 % (ref 56–140)
Ristocetin Co-factor, Plasma: 10 % — ABNORMAL LOW (ref 50–200)
Ristocetin Co-factor, Plasma: 30 % — ABNORMAL LOW (ref 50–200)
Ristocetin Co-factor, Plasma: 19 % — ABNORMAL LOW (ref 50–200)
Von Willebrand Antigen, Plasma: 50 % (ref 50–200)
Von Willebrand Antigen, Plasma: 109 % (ref 50–200)
Von Willebrand Antigen, Plasma: 88 % (ref 50–200)

## 2024-07-30 LAB — COAG STUDIES INTERP REPORT

## 2024-07-31 ENCOUNTER — Encounter: Payer: Self-pay | Admitting: Internal Medicine

## 2024-07-31 NOTE — Assessment & Plan Note (Signed)
#   Von Willebrand type II M- s/p status post DDAVP challenge-suboptimal response; baseline ristocetin cofactor 10; after 2 and half hours 30; after 4 hours 19.  Factor VIII levels-baseline 71; after 2-1/2 hours-162; after 4 hours-126.  # I reviewed the suboptimal response to DDAVP challenge.  For minor surgeries-von Willebrand concentrate/recombinant factor; tranexamic acid-in case of emergency/unavailability DDAVP.  Major surgery-recommend von Willebrand factor  concentrate/recombinant factor.  DDAVP alone should not be used.   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  # Discussed regarding avoidance of antiplatelet therapy like aspirin NSAIDs-ibuprofen Advil naproxen.  Reasonable to take Tylenol  as needed.  Recommend workup of family/children for von Willebrand disease.  DISPOSITION: # follow up TBD  - Dr.B

## 2024-07-31 NOTE — Progress Notes (Unsigned)
 [facility] CONSULT NOTE  Patient Care Team: Fernande Ophelia JINNY DOUGLAS, MD as PCP - General (Internal Medicine) Rennie Cindy SAUNDERS, MD as Consulting Physician (Hematology and Oncology)  CHIEF COMPLAINTS/PURPOSE OF CONSULTATION: Von Willebrand disease type 2 M  HEMATOLOGY HISTORY:  # March-[April 2025 [place] nose bleed[place] Abnormal labs including bleeding time 5 minutes 2 seconds [normal 1 to 3 minutes]; clotting time 4 minutes 17 seconds [normal]; APRIL/MAY 2025- PT PTT normal;  platelet function assay-abnormal; von Willebrand antigen 42% [Dx low-less than 30]; ristocetin cofactor- 10%;-diagnostic of von Willebrand disease.  Factor VIII-Normal. [90-108]; MM panel; K/l light chains;  Thyroid  profile- WML.# However based on ratio-ristocetin cofactor /antigen  < 0.7 workup suggestive of-vWD type-2- M-  Multimer assay; within normal limits in case  HISTORY OF PRESENTING ILLNESS: Patient ambulating-independently. Accompanied by spouse over the phone.   Meredith [name] 58 y.o.  female with new diagnosis of von Willebrand disease type 65M [APRIL MAY 2025] is here to review the results of the DDAVP challenge./And the plan of care.  Patient denies any further episodes of bleeding.  Patient is planned to travel to [place] in the end of the month.    MEDICAL HISTORY:  Past Medical History:  Diagnosis Date   Anemia    Anxiety    Asthma    Chronic insomnia    Depression    Dermatitis    Gastroparesis    GERD (gastroesophageal reflux disease)    Hypertension    Hypothyroidism    Pre-diabetes    Psoriasis    Rhinitis     SURGICAL HISTORY: Past Surgical History:  Procedure Laterality Date   APPENDECTOMY     BREAST BIOPSY Left 04/09/2024   US  LT BREAST BX W LOC DEV 1ST LESION IMG BX SPEC US  GUIDE 04/09/2024 [facility]   CHOLECYSTECTOMY  2017   EYE SURGERY     LUMBAR LAMINECTOMY/DECOMPRESSION MICRODISCECTOMY N/A 04/22/2021   Procedure: LAMINECTOMY AND FORAMINOTOMY Lumbar  three-four, Lumbar four-five;  Surgeon: Debby Dorn MATSU, MD;  Location: [facility] OR;  Service: Neurosurgery;  Laterality: N/A;    SOCIAL HISTORY: Social History   Socioeconomic History   Marital status: Widowed    Spouse name: Not on file   Number of children: Not on file   Years of education: Not on file   Highest education level: Not on file  Occupational History   Occupation: unemployed   Tobacco Use   Smoking status: Never   Smokeless tobacco: Never  Vaping Use   Vaping status: Never Used  Substance and Sexual Activity   Alcohol use: No   Drug use: No   Sexual activity: Yes    Comment: post-menopausa;  Other Topics Concern   Not on file  Social History Narrative   Not on file   Social Drivers of Health   Financial Resource Strain: Low Risk  (03/13/2024)   Received from [facility]   Overall Financial Resource Strain (CARDIA)    Difficulty of Paying Living Expenses: Not hard at all  Food Insecurity: No Food Insecurity (03/13/2024)   Received from [facility]   Hunger Vital Sign    Within the past 12 months, you worried that your food would run out before you got the money to buy more.: Never true    Within the past 12 months, the food you bought just didn't last and you didn't have money to get more.: Never true  Transportation Needs: No Transportation Needs (03/13/2024)   Received from  [facility]   PRAPARE - Transportation    In the past 12 months, has lack of transportation kept you from medical appointments or from getting medications?: No    Lack of Transportation (Non-Medical): No  Physical Activity: Not on file  Stress: Stress Concern Present (12/24/2019)   [employer] of Occupational Health - Occupational Stress Questionnaire    Feeling of Stress : Rather much  Social Connections: Unknown (03/05/2022)   Received from [facility]   Social Network    Social Network: Not on file  Intimate Partner  Violence: Not At Risk (02/21/2024)   Humiliation, Afraid, Rape, and Kick questionnaire    Fear of Current or Ex-Partner: No    Emotionally Abused: No    Physically Abused: No    Sexually Abused: No    FAMILY HISTORY: Family History  Problem Relation Age of Onset   Diabetes Mother    Heart attack Mother    Hyperlipidemia Father    Hypertension Father    Eczema Father    Breast cancer Sister    Colon cancer Son     ALLERGIES:  is allergic to latex, shellfish allergy, and amoxicillin-pot clavulanate.  MEDICATIONS:  Current Outpatient Medications  Medication Sig Dispense Refill   acyclovir  ointment (ZOVIRAX ) 5 % Apply 1 Application topically as directed. Apply to sores on buttocks 4-5 times daily prn flares 30 g 3   albuterol (VENTOLIN HFA) 108 (90 Base) MCG/ACT inhaler Inhale 1 puff into the lungs every 4 (four) hours as needed for wheezing or shortness of breath.     Apremilast  (OTEZLA ) 30 MG TABS Take 1 tablet (30 mg total) by mouth 2 (two) times daily. 60 tablet 2   betamethasone  dipropionate (DIPROLENE ) 0.05 % ointment APPLY TO AREA ON LOWER LEGS ONCE DAILY UNTIL IMPROVED THEN ONLY  ON WEEKENDS. AVOID FACE, GROIN,  AXILLA RISK OF SKIN ATROPHY WITH LONG TERM USE 135 g 0   carboxymethylcellulose (REFRESH PLUS) 0.5 % SOLN Place 1 drop into both eyes daily as needed (dry eyes).     celecoxib (CELEBREX) 200 MG capsule Take 200 mg by mouth 2 (two) times daily.     cetirizine (ZYRTEC) 10 MG tablet Take 10 mg by mouth daily as needed for allergies.     Cholecalciferol 50 MCG (2000 UT) CAPS Take 2,000 Units by mouth in the morning and at bedtime.     cyanocobalamin  (,VITAMIN B-12,) 1000 MCG/ML injection INJECT 1ML INTO THE MUSCLE EVERY 30 DAYS (Patient taking differently: Inject 1,000 mcg into the muscle every 30 (thirty) days. INJECT INTO THE MUSCLE EVERY 30 DAYS) 1 mL 3   DEXILANT 60 MG capsule Take 60 mg by mouth daily.     DULoxetine  (CYMBALTA ) 60 MG capsule Take 60 mg by mouth 2  (two) times daily.     famotidine (PEPCID) 20 MG tablet Take 20 mg by mouth at bedtime.     gabapentin  (NEURONTIN ) 300 MG capsule Take 1 capsule (300 mg total) by mouth 2 (two) times daily. (Patient taking differently: Take 300 mg by mouth 3 (three) times daily.  3 times a day per pt) 180 capsule 1   hydrochlorothiazide (MICROZIDE) 12.5 MG capsule Take 12.5 mg by mouth daily.     hydroquinone  4 % cream Apply topically 2 (two) times daily. Use for up to 3 months on any dark spots. After 3 months take a break for 3 months. 28.35 g 2   levothyroxine  (SYNTHROID ) 125 MCG tablet Take 125 mcg by  mouth daily before breakfast.     losartan  (COZAAR ) 25 MG tablet Take 1 tablet (25 mg total) by mouth at bedtime. 90 tablet 1   Multiple Vitamin (MULTIVITAMIN ADULT PO) Take 1 tablet by mouth daily.     nortriptyline (PAMELOR) 10 MG capsule Take 40 mg by mouth at bedtime.     Omega-3 1000 MG CAPS Take 1,000 mg by mouth daily.     polyethylene glycol powder (GLYCOLAX/MIRALAX) 17 GM/SCOOP powder Take 17 g by mouth daily.     Roflumilast  (ZORYVE ) 0.3 % CREA Apply to affected area lower leg once daily. 60 g 2   SKYRIZI PEN 150 MG/ML pen      SYRINGE-NEEDLE, DISP, 3 ML (B-D 3CC LUER-LOK SYR 25GX1) 25G X 1 3 ML MISC Use 1 syringe with needle monthly with B-1 2injections 1 each 6   tacrolimus  (PROTOPIC ) 0.1 % ointment Apply topically once to twice daily to affected areas right leg, face. 100 g 2   Tapinarof  (VTAMA ) 1 % CREA Apply topically to affected areas of legs for psoriasis 60 g 1   valACYclovir  (VALTREX ) 500 MG tablet TAKE 1 TABLET BY MOUTH TWICE  DAILY 180 tablet 2   vitamin C (ASCORBIC ACID) 500 MG tablet Take 500 mg by mouth daily.     vitamin E 180 MG (400 UNITS) capsule Take 400 Units by mouth daily.     zolpidem  (AMBIEN  CR) 12.5 MG CR tablet Take 1 tablet (12.5 mg total) by mouth at bedtime as needed. (Patient taking differently: Take 12.5 mg by mouth at bedtime.) 30 tablet 1   No current  facility-administered medications for this visit.    PHYSICAL EXAMINATION:   Vitals:   08/01/24 0945  BP: (!) 140/89  Pulse: 93  Resp: 18  Temp: 97.6 F (36.4 C)  SpO2: 100%     Filed Weights   08/01/24 0945  Weight: 139 lb 9.6 oz (63.3 kg)      Physical Exam Vitals and nursing note reviewed.  HENT:     Head: Normocephalic and atraumatic.     Mouth/Throat:     Pharynx: Oropharynx is clear.  Eyes:     Extraocular Movements: Extraocular movements intact.     Pupils: Pupils are equal, round, and reactive to light.  Cardiovascular:     Rate and Rhythm: Normal rate and regular rhythm.  Pulmonary:     Comments: Decreased breath sounds bilaterally.  Abdominal:     Palpations: Abdomen is soft.  Musculoskeletal:        General: Normal range of motion.     Cervical back: Normal range of motion.  Skin:    General: Skin is warm.  Neurological:     General: No focal deficit present.     Mental Status: She is alert and oriented to person, place, and time.  Psychiatric:        Behavior: Behavior normal.        Judgment: Judgment normal.     LABORATORY DATA:  I have reviewed the data as listed Lab Results  Component Value Date   WBC 10.0 02/21/2024   HGB 13.2 02/21/2024   HCT 40.2 02/21/2024   MCV 81.2 02/21/2024   PLT 376 02/21/2024   Recent Labs    02/21/24 1620  NA 135  K 3.8  CL 100  CO2 24  GLUCOSE 142*  BUN 11  CREATININE 0.68  CALCIUM 9.7  GFRNONAA >60  PROT 8.4*  ALBUMIN 4.3  AST 85*  ALT 63*  ALKPHOS 83  BILITOT 1.2     XR C-ARM NO REPORT Result Date: 07/25/2024 Please see Notes tab for imaging impression.   Von Willebrand disease (HCC) # SEP 202025- Dx:Von Willebrand type 2 I M- s/p status post DDAVP challenge-suboptimal response; baseline ristocetin cofactor 10; after 2 and half hours 30; after 4 hours 19.  Factor VIII levels-baseline 71; after 2-1/2 hours-162; after 4 hours-126.  # I reviewed the suboptimal response to DDAVP  challenge.  For minor surgeries-von Willebrand concentrate/recombinant factor; tranexamic acid-in case of emergency/in case of unavailability other options trial of DDAVP could be considered.    # However for any major surgery-recommend von Willebrand factor  concentrate/recombinant factor.  DDAVP alone should not be used.   # I gave the patient a printed information about her diagnosis-and prophylactic treatment plan in case of any major surgeries.   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  # Discussed regarding avoidance of antiplatelet therapy like aspirin NSAIDs-ibuprofen Advil naproxen.  Reasonable to take Tylenol  as needed.  Recommend workup of family/children for von Willebrand disease.  # #Since patient is clinically stable I think is reasonable for the patient to follow-up with PCP/can follow-up with us  as needed.  Patient comfortable with the plan; to call us  if any questions or concerns in the interim.  Discussed that patient should follow-up with hematology if she is planning to have any elective procedures-or has any further excessive bleeding episodes.   DISPOSITION: # follow up TBD - Dr.B  # 25 minutes face-to-face with the patient discussing the above plan of care; more than 50% of time spent on prognosis/ natural history; counseling and coordination.  All questions were answered. The patient knows to call the clinic with any problems, questions or concerns.    Cindy JONELLE Joe, MD 08/01/2024 1:17 PM

## 2024-08-01 ENCOUNTER — Ambulatory Visit: Admitting: Internal Medicine

## 2024-08-01 ENCOUNTER — Encounter: Payer: Self-pay | Admitting: Internal Medicine

## 2024-08-01 VITALS — BP 140/89 | HR 93 | Temp 97.6°F | Resp 18 | Ht 60.0 in | Wt 139.6 lb

## 2024-08-01 DIAGNOSIS — D68022 Von Willebrand disease, type 2m: Secondary | ICD-10-CM | POA: Diagnosis not present

## 2024-08-01 DIAGNOSIS — D6800 Von Willebrand disease, unspecified: Secondary | ICD-10-CM

## 2024-08-01 NOTE — Progress Notes (Signed)
 No concerns today

## 2024-08-04 NOTE — Procedures (Signed)
 Lumbar Diagnostic Facet Joint Nerve Block with Fluoroscopic Guidance   Patient: Meredith [name]      Date of Birth: 1966/06/21 MRN: [id number] PCP: Fernande Ophelia JINNY DOUGLAS, MD      Visit Date: 07/25/2024   Universal Protocol:    Date/Time: 10/12/258:28 PM  Consent Given By: the patient  Position: PRONE  Additional Comments: Vital signs were monitored before and after the procedure. Patient was prepped and draped in the usual sterile fashion. The correct patient, procedure, and site was verified.   Injection Procedure Details:   Procedure diagnoses:  1. Spondylosis without myelopathy or radiculopathy, lumbar region      Meds Administered:  Meds ordered this encounter  Medications   bupivacaine  (MARCAINE ) 0.5 % (with pres) injection 3 mL     Laterality: Bilateral  Location/Site: L4-L5, L3 and L4 medial branches  Needle: 5.0 in., 25 ga.  Short bevel or Quincke spinal needle  Needle Placement: Oblique pedical  Findings:   -Comments: There was excellent flow of contrast along the articular pillars without intravascular flow.  Procedure Details: The fluoroscope beam is vertically oriented in AP and then obliqued 15 to 20 degrees to the ipsilateral side of the desired nerve to achieve the "Scotty dog" appearance.  The skin over the target area of the junction of the superior articulating process and the transverse process (sacral ala if blocking the L5 dorsal rami) was locally anesthetized with a 1 ml volume of 1% Lidocaine  without Epinephrine .  The spinal needle was inserted and advanced in a trajectory view down to the target.   After contact with periosteum and negative aspirate for blood and CSF, correct placement without intravascular or epidural spread was confirmed by injecting 0.5 ml. of Isovue -250.  A spot radiograph was obtained of this image.    Next, a 0.5 ml. volume of the injectate described above was injected. The needle was then redirected to the other facet joint  nerves mentioned above if needed.  Prior to the procedure, the patient was given a Pain Diary which was completed for baseline measurements.  After the procedure, the patient rated their pain every 30 minutes and will continue rating at this frequency for a total of 5 hours.  The patient has been asked to complete the Diary and return to us  by mail, fax or hand delivered as soon as possible.   Additional Comments:  The patient tolerated the procedure well Dressing: 2 x 2 sterile gauze and Band-Aid    Post-procedure details: Patient was observed during the procedure. Post-procedure instructions were reviewed.  Patient left the clinic in stable condition.

## 2024-08-04 NOTE — Progress Notes (Signed)
 Meredith [name] - 58 y.o. female MRN [id number]  Date of birth: Feb 13, 1966  Office Visit Note: Visit Date: 07/25/2024 PCP: Fernande Ophelia JINNY DOUGLAS, MD Referred by: Fernande Ophelia JINNY DOUGLAS, MD  Subjective: Chief Complaint  Patient presents with   Lower Back - Pain   HPI:  Meredith [name] is a 58 y.o. female who comes in today for planned repeat Bilateral L4-5 Lumbar facet/medial branch block with fluoroscopic guidance.  The patient has failed conservative care including home exercise, medications, time and activity modification.  This injection will be diagnostic and hopefully therapeutic.  Please see requesting physician notes for further details and justification.  Exam shows concordant low back pain with facet joint loading and extension. Patient received more than 80% pain relief from prior injection. This would be the second block in a diagnostic double block paradigm.  By way of review had right side on RFA by [place] [facility] a few years ago with success.     Referring:Megan Trudy, FNP   ROS Otherwise per HPI.  Assessment & Plan: Visit Diagnoses:    ICD-10-CM   1. Spondylosis without myelopathy or radiculopathy, lumbar region  M47.816 XR C-ARM NO REPORT    Nerve Block    bupivacaine  (MARCAINE ) 0.5 % (with pres) injection 3 mL      Plan: No additional findings.   Meds & Orders:  Meds ordered this encounter  Medications   bupivacaine  (MARCAINE ) 0.5 % (with pres) injection 3 mL    Orders Placed This Encounter  Procedures   Nerve Block   XR C-ARM NO REPORT    Follow-up: Return for Review Pain Diary.   Procedures: No procedures performed  Lumbar Diagnostic Facet Joint Nerve Block with Fluoroscopic Guidance   Patient: Meredith [name]      Date of Birth: 10-28-1965 MRN: [id number] PCP: Fernande Ophelia JINNY DOUGLAS, MD      Visit Date: 07/25/2024   Universal Protocol:    Date/Time: 10/12/258:28 PM  Consent Given By: the patient  Position: PRONE  Additional Comments: Vital signs were  monitored before and after the procedure. Patient was prepped and draped in the usual sterile fashion. The correct patient, procedure, and site was verified.   Injection Procedure Details:   Procedure diagnoses:  1. Spondylosis without myelopathy or radiculopathy, lumbar region      Meds Administered:  Meds ordered this encounter  Medications   bupivacaine  (MARCAINE ) 0.5 % (with pres) injection 3 mL     Laterality: Bilateral  Location/Site: L4-L5, L3 and L4 medial branches  Needle: 5.0 in., 25 ga.  Short bevel or Quincke spinal needle  Needle Placement: Oblique pedical  Findings:   -Comments: There was excellent flow of contrast along the articular pillars without intravascular flow.  Procedure Details: The fluoroscope beam is vertically oriented in AP and then obliqued 15 to 20 degrees to the ipsilateral side of the desired nerve to achieve the "Scotty dog" appearance.  The skin over the target area of the junction of the superior articulating process and the transverse process (sacral ala if blocking the L5 dorsal rami) was locally anesthetized with a 1 ml volume of 1% Lidocaine  without Epinephrine .  The spinal needle was inserted and advanced in a trajectory view down to the target.   After contact with periosteum and negative aspirate for blood and CSF, correct placement without intravascular or epidural spread was confirmed by injecting 0.5 ml. of Isovue -250.  A spot radiograph was obtained of this image.    Next, a 0.5  ml. volume of the injectate described above was injected. The needle was then redirected to the other facet joint nerves mentioned above if needed.  Prior to the procedure, the patient was given a Pain Diary which was completed for baseline measurements.  After the procedure, the patient rated their pain every 30 minutes and will continue rating at this frequency for a total of 5 hours.  The patient has been asked to complete the Diary and return to us  by  mail, fax or hand delivered as soon as possible.   Additional Comments:  The patient tolerated the procedure well Dressing: 2 x 2 sterile gauze and Band-Aid    Post-procedure details: Patient was observed during the procedure. Post-procedure instructions were reviewed.  Patient left the clinic in stable condition.   Clinical History: CLINICAL DATA:  Low back pain radiating down the left leg over the last 3 months.   EXAM: MRI LUMBAR SPINE WITHOUT CONTRAST   TECHNIQUE: Multiplanar, multisequence MR imaging of the lumbar spine was performed. No intravenous contrast was administered.   COMPARISON:  Radiography 03/16/2022.  MRI 08/30/2021.   FINDINGS: Segmentation:  5 lumbar type vertebral bodies.   Alignment:  Normal   Vertebrae: No fracture or edematous lesion. Chronic benign hemangioma within the L3 vertebral body.   Conus medullaris and cauda equina: Conus extends to the L1 level. Conus and cauda equina appear normal.   Paraspinal and other soft tissues: Negative   Disc levels:   T12-L1: Shallow disc protrusion. No compressive stenosis. No change.   L1-2: Minimal disc bulge.  No stenosis.  No change.   L2-3: Shallow disc protrusion. Slight indentation of the thecal sac. No stenosis. No change.   L3-4: Previous posterior decompression. Shallow disc protrusion. Slight indentation of the thecal sac. No compressive stenosis. No change.   L4-5: Previous posterior decompression. Shallow protrusion of the disc, minimally more prominent. Mild facet hypertrophy. Stenosis of the subarticular lateral recesses left more than right that could cause neural compression, particularly on the left. Edematous change of the facet joints that could be symptomatic.   L5-S1: Mild bulging of the disc. Mild facet degeneration and hypertrophy. No compressive stenosis.   IMPRESSION: Previous posterior decompression at L3-4 and L4-5. No compressive stenosis at L3-4. At L4-5, there  is shallow protrusion of the disc which is minimally more prominent. There is bilateral facet hypertrophy worse on the left than the right. There is stenosis of the lateral recesses worse on the left than the right that could cause neural compression.   Chronic, grossly non-compressive degenerative changes at the other levels as outlined above.     Electronically Signed   By: Oneil Officer M.D.   On: 03/28/2022 16:36     Objective:  VS:  HT:    WT:   BMI:     BP:129/80  HR:88bpm  TEMP: ( )  RESP:  Physical Exam Vitals and nursing note reviewed.  Constitutional:      General: She is not in acute distress.    Appearance: Normal appearance. She is not ill-appearing.  HENT:     Head: Normocephalic and atraumatic.     Right Ear: External ear normal.     Left Ear: External ear normal.  Eyes:     Extraocular Movements: Extraocular movements intact.  Cardiovascular:     Rate and Rhythm: Normal rate.     Pulses: Normal pulses.  Pulmonary:     Effort: Pulmonary effort is normal. No respiratory distress.  Abdominal:  General: There is no distension.     Palpations: Abdomen is soft.  Musculoskeletal:        General: Tenderness present.     Cervical back: Neck supple.     Right lower leg: No edema.     Left lower leg: No edema.     Comments: Patient has good distal strength with no pain over the greater trochanters.  No clonus or focal weakness.  Skin:    Findings: No erythema, lesion or rash.  Neurological:     General: No focal deficit present.     Mental Status: She is alert and oriented to person, place, and time.     Sensory: No sensory deficit.     Motor: No weakness or abnormal muscle tone.     Coordination: Coordination normal.  Psychiatric:        Mood and Affect: Mood normal.        Behavior: Behavior normal.      Imaging: No results found.

## 2024-08-05 ENCOUNTER — Ambulatory Visit

## 2024-08-05 DIAGNOSIS — Z860101 Personal history of adenomatous and serrated colon polyps: Secondary | ICD-10-CM | POA: Diagnosis not present

## 2024-08-05 DIAGNOSIS — Z09 Encounter for follow-up examination after completed treatment for conditions other than malignant neoplasm: Secondary | ICD-10-CM | POA: Diagnosis present

## 2024-08-09 ENCOUNTER — Ambulatory Visit: Admitting: Oncology

## 2024-08-12 NOTE — Progress Notes (Unsigned)
 [name] [address] [place] [address] Phone: [phone] Subjective:   ISusannah Gully, am serving as a scribe for Dr. Arthea Claudene.  I'm seeing this patient by the request  of:  Fernande Ophelia JINNY DOUGLAS, MD  CC: Low back pain follow-up  YEP:Dlagzrupcz  Ziah Bacchi is a 58 y.o. female coming in with complaint of back and neck pain. OMT 04/08/2024. Patient states same per usual. Ablation later this week. No new concerns.  Medications patient has been prescribed: None  Taking:      Reviewed prior external information including notes and imaging from previsou exam, outside providers and external EMR if available.   As well as notes that were available from care everywhere and other healthcare systems.  Past medical history, social, surgical and family history all reviewed in electronic medical record.  No pertanent information unless stated regarding to the chief complaint.   Past Medical History:  Diagnosis Date   Anemia    Anxiety    Asthma    Chronic insomnia    Depression    Dermatitis    Gastroparesis    GERD (gastroesophageal reflux disease)    Hypertension    Hypothyroidism    Pre-diabetes    Psoriasis    Rhinitis     Allergies  Allergen Reactions   Latex Rash   Shellfish Allergy Rash    Not all the time    Amoxicillin-Pot Clavulanate     unknown     Review of Systems:  No headache, visual changes, nausea, vomiting, diarrhea, constipation, dizziness, abdominal pain, skin rash, fevers, chills, night sweats, weight loss, swollen lymph nodes, body aches, joint swelling, chest pain, shortness of breath, mood changes. POSITIVE muscle aches  Objective  Blood pressure 126/82, pulse 93, height 5' (1.524 m), weight 141 lb (64 kg), SpO2 97%.   General: No apparent distress alert and oriented x3 mood and affect normal, dressed appropriately.  HEENT: Pupils equal, extraocular movements intact  Respiratory: Patient's speak in  full sentences and does not appear short of breath  Cardiovascular: No lower extremity edema, non tender, no erythema  Gait MSK:  Back does have some loss lordosis noted.  Some tenderness to palpation in the paraspinal musculature.  Seems to be from the thoracolumbar juncture as well as the sacral area.  Tightness with any extension greater than 5 degrees.  Osteopathic findings   T9 extended rotated and side bent left L2 flexed rotated and side bent right L3 flexed rotated and side bent left Sacrum right on right       Assessment and Plan:  Spinal stenosis, lumbar region, with neurogenic claudication Significant spinal stenosis and facet arthropathy, undergoing radiofrequency ablation in the relatively near future.  Discussed with patient about icing regimen and home exercises, discussed which activities to do and which ones to avoid.  Given tramadol  with patient going to be traveling out of the country and has difficulty.    Nonallopathic problems  Decision today to treat with OMT was based on Physical Exam  After verbal consent patient was treated with  ME, FPR techniques inthoracic, lumbar, and sacral  areas  Patient tolerated the procedure well with improvement in symptoms  Patient given exercises, stretches and lifestyle modifications  See medications in patient instructions if given  Patient will follow up in 4-8 weeks    The above documentation has been reviewed and is accurate and complete Teasia Zapf M Langston [place], DO  Note: This dictation was prepared with Dragon dictation along with smaller phrase technology. Any transcriptional errors that result from this process are unintentional.

## 2024-08-13 ENCOUNTER — Encounter: Payer: Self-pay | Admitting: Family Medicine

## 2024-08-13 ENCOUNTER — Ambulatory Visit: Admitting: Family Medicine

## 2024-08-13 VITALS — BP 126/82 | HR 93 | Ht 60.0 in | Wt 141.0 lb

## 2024-08-13 DIAGNOSIS — M9902 Segmental and somatic dysfunction of thoracic region: Secondary | ICD-10-CM | POA: Diagnosis not present

## 2024-08-13 DIAGNOSIS — M9903 Segmental and somatic dysfunction of lumbar region: Secondary | ICD-10-CM

## 2024-08-13 DIAGNOSIS — M48062 Spinal stenosis, lumbar region with neurogenic claudication: Secondary | ICD-10-CM

## 2024-08-13 DIAGNOSIS — M9904 Segmental and somatic dysfunction of sacral region: Secondary | ICD-10-CM | POA: Diagnosis not present

## 2024-08-13 MED ORDER — TRAMADOL HCL 50 MG PO TABS: 50.0000 mg | ORAL_TABLET | Freq: Three times a day (TID) | ORAL | 0 refills | Status: AC | PRN

## 2024-08-13 NOTE — Assessment & Plan Note (Signed)
 Significant spinal stenosis and facet arthropathy, undergoing radiofrequency ablation in the relatively near future.  Discussed with patient about icing regimen and home exercises, discussed which activities to do and which ones to avoid.  Given tramadol  with patient going to be traveling out of the country and has difficulty.

## 2024-08-13 NOTE — Patient Instructions (Signed)
 Good to see you! See you again in 6 months Safe Travels Tramadol  prescribed for flight

## 2024-08-16 ENCOUNTER — Ambulatory Visit: Admitting: Internal Medicine

## 2024-08-16 ENCOUNTER — Encounter: Payer: Self-pay | Admitting: Dermatology

## 2024-08-16 ENCOUNTER — Ambulatory Visit (INDEPENDENT_AMBULATORY_CARE_PROVIDER_SITE_OTHER): Admitting: Physical Medicine and Rehabilitation

## 2024-08-16 ENCOUNTER — Other Ambulatory Visit: Payer: Self-pay

## 2024-08-16 VITALS — BP 117/74 | HR 93

## 2024-08-16 DIAGNOSIS — M47816 Spondylosis without myelopathy or radiculopathy, lumbar region: Secondary | ICD-10-CM

## 2024-08-16 MED ORDER — METHYLPREDNISOLONE ACETATE 40 MG/ML IJ SUSP
40.0000 mg | Freq: Once | INTRAMUSCULAR | Status: AC
  Administered 2024-08-16: 40 mg

## 2024-08-16 NOTE — Procedures (Signed)
 Lumbar Facet Joint Nerve Denervation  Patient: [name]      Date of Birth: 02/11/1966 MRN: [id number] PCP: Fernande Ophelia JINNY DOUGLAS, MD      Visit Date: 08/16/2024   Universal Protocol:    Date/Time: 10/24/258:49 AM  Consent Given By: the patient  Position: PRONE  Additional Comments: Vital signs were monitored before and after the procedure. Patient was prepped and draped in the usual sterile fashion. The correct patient, procedure, and site was verified.   Injection Procedure Details:   Procedure diagnoses:  1. Spondylosis without myelopathy or radiculopathy, lumbar region      Meds Administered:  Meds ordered this encounter  Medications   methylPREDNISolone  acetate (DEPO-MEDROL ) injection 40 mg     Laterality: Bilateral  Location/Site:  L4-L5, L3 and L4 medial branches  Needle: 18 ga.,  10mm active tip, RF Cannula  Needle Placement: Along juncture of superior articular process and transverse pocess  Findings:  -Comments:  Procedure Details: For each desired target nerve, the corresponding transverse process (sacral ala for the L5 dorsal rami) was identified and the fluoroscope was positioned to square off the endplates of the corresponding vertebral body to achieve a true AP midline view.  The beam was then obliqued 15 to 20 degrees and caudally tilted 15 to 20 degrees to line up a trajectory along the target nerves. The skin over the target of the junction of superior articulating process and transverse process (sacral ala for the L5 dorsal rami) was infiltrated with 1ml of 1% Lidocaine  without Epinephrine .  The 18 gauge 10mm active tip outer cannula was advanced in trajectory view to the target.  This procedure was repeated for each target nerve.  Then, for all levels, the outer cannula placement was fine-tuned and the position was then confirmed with bi-planar imaging.    Test stimulation was done both at sensory and motor levels to ensure there was no  radicular stimulation. The target tissues were then infiltrated with 1 ml of 1% Lidocaine  without Epinephrine . Subsequently, a percutaneous neurotomy was carried out for 90 seconds at 80 degrees Celsius.  After the completion of the lesion, 1 ml of injectate was delivered. It was then repeated for each facet joint nerve mentioned above. Appropriate radiographs were obtained to verify the probe placement during the neurotomy.   Additional Comments:  The patient tolerated the procedure well Dressing: 2 x 2 sterile gauze and Band-Aid    Post-procedure details: Patient was observed during the procedure. Post-procedure instructions were reviewed.  Patient left the clinic in stable condition.

## 2024-08-16 NOTE — Progress Notes (Signed)
 Pain Scale   Average Pain 8 Patient advising she has chronic lower back pain radiating to both legs at times sitting helps at times standing increases her pain        +Driver, -BT, -Dye Allergies.

## 2024-08-16 NOTE — Progress Notes (Signed)
 [name] - 58 y.o. female MRN [id number]  Date of birth: 09-12-1966  Office Visit Note: Visit Date: 08/16/2024 PCP: Fernande Ophelia JINNY DOUGLAS, MD Referred by: Fernande Ophelia JINNY DOUGLAS, MD  Subjective: Chief Complaint  Patient presents with   Lower Back - Pain   HPI:  M[name] is a 58 y.o. female who comes in todayfor planned radiofrequency ablation of the Bilateral L4-5 Lumbar facet joints. This would be ablation of the corresponding medial branches and/or dorsal rami.  Patient has had double diagnostic blocks with more than 50% relief.  These are documented on pain diary.  They have had chronic back pain for quite some time, more than 3 months, which has been an ongoing situation with recalcitrant axial back pain.  They have no radicular pain.  Their axial pain is worse with standing and ambulating and on exam today with facet loading.  They have had physical therapy as well as home exercise program.  The imaging noted in the chart below indicated facet pathology. Accordingly they meet all the criteria and qualification for for radiofrequency ablation and we are going to complete this today hopefully for more longer term relief as part of comprehensive management program.   ROS Otherwise per HPI.  Assessment & Plan: Visit Diagnoses:    ICD-10-CM   1. Spondylosis without myelopathy or radiculopathy, lumbar region  M47.816 XR C-ARM NO REPORT    Radiofrequency,Lumbar    methylPREDNISolone  acetate (DEPO-MEDROL ) injection 40 mg      Plan: No additional findings.   Meds & Orders:  Meds ordered this encounter  Medications   methylPREDNISolone  acetate (DEPO-MEDROL ) injection 40 mg    Orders Placed This Encounter  Procedures   Radiofrequency,Lumbar   XR C-ARM NO REPORT    Follow-up: Return for visit to requesting provider as needed.   Procedures: No procedures performed  Lumbar Facet Joint Nerve Denervation  Patient: [name]      Date of Birth: 06-25-1966 MRN: [id number] PCP:  Fernande Ophelia JINNY DOUGLAS, MD      Visit Date: 08/16/2024   Universal Protocol:    Date/Time: 10/24/258:49 AM  Consent Given By: the patient  Position: PRONE  Additional Comments: Vital signs were monitored before and after the procedure. Patient was prepped and draped in the usual sterile fashion. The correct patient, procedure, and site was verified.   Injection Procedure Details:   Procedure diagnoses:  1. Spondylosis without myelopathy or radiculopathy, lumbar region      Meds Administered:  Meds ordered this encounter  Medications   methylPREDNISolone  acetate (DEPO-MEDROL ) injection 40 mg     Laterality: Bilateral  Location/Site:  L4-L5, L3 and L4 medial branches  Needle: 18 ga.,  10mm active tip, RF Cannula  Needle Placement: Along juncture of superior articular process and transverse pocess  Findings:  -Comments:  Procedure Details: For each desired target nerve, the corresponding transverse process (sacral ala for the L5 dorsal rami) was identified and the fluoroscope was positioned to square off the endplates of the corresponding vertebral body to achieve a true AP midline view.  The beam was then obliqued 15 to 20 degrees and caudally tilted 15 to 20 degrees to line up a trajectory along the target nerves. The skin over the target of the junction of superior articulating process and transverse process (sacral ala for the L5 dorsal rami) was infiltrated with 1ml of 1% Lidocaine  without Epinephrine .  The 18 gauge 10mm active tip outer cannula was advanced in trajectory view to  the target.  This procedure was repeated for each target nerve.  Then, for all levels, the outer cannula placement was fine-tuned and the position was then confirmed with bi-planar imaging.    Test stimulation was done both at sensory and motor levels to ensure there was no radicular stimulation. The target tissues were then infiltrated with 1 ml of 1% Lidocaine  without Epinephrine .  Subsequently, a percutaneous neurotomy was carried out for 90 seconds at 80 degrees Celsius.  After the completion of the lesion, 1 ml of injectate was delivered. It was then repeated for each facet joint nerve mentioned above. Appropriate radiographs were obtained to verify the probe placement during the neurotomy.   Additional Comments:  The patient tolerated the procedure well Dressing: 2 x 2 sterile gauze and Band-Aid    Post-procedure details: Patient was observed during the procedure. Post-procedure instructions were reviewed.  Patient left the clinic in stable condition.      Clinical History: CLINICAL DATA:  Low back pain radiating down the left leg over the last 3 months.   EXAM: MRI LUMBAR SPINE WITHOUT CONTRAST   TECHNIQUE: Multiplanar, multisequence MR imaging of the lumbar spine was performed. No intravenous contrast was administered.   COMPARISON:  Radiography 03/16/2022.  MRI 08/30/2021.   FINDINGS: Segmentation:  5 lumbar type vertebral bodies.   Alignment:  Normal   Vertebrae: No fracture or edematous lesion. Chronic benign hemangioma within the L3 vertebral body.   Conus medullaris and cauda equina: Conus extends to the L1 level. Conus and cauda equina appear normal.   Paraspinal and other soft tissues: Negative   Disc levels:   T12-L1: Shallow disc protrusion. No compressive stenosis. No change.   L1-2: Minimal disc bulge.  No stenosis.  No change.   L2-3: Shallow disc protrusion. Slight indentation of the thecal sac. No stenosis. No change.   L3-4: Previous posterior decompression. Shallow disc protrusion. Slight indentation of the thecal sac. No compressive stenosis. No change.   L4-5: Previous posterior decompression. Shallow protrusion of the disc, minimally more prominent. Mild facet hypertrophy. Stenosis of the subarticular lateral recesses left more than right that could cause neural compression, particularly on the left. Edematous  change of the facet joints that could be symptomatic.   L5-S1: Mild bulging of the disc. Mild facet degeneration and hypertrophy. No compressive stenosis.   IMPRESSION: Previous posterior decompression at L3-4 and L4-5. No compressive stenosis at L3-4. At L4-5, there is shallow protrusion of the disc which is minimally more prominent. There is bilateral facet hypertrophy worse on the left than the right. There is stenosis of the lateral recesses worse on the left than the right that could cause neural compression.   Chronic, grossly non-compressive degenerative changes at the other levels as outlined above.     Electronically Signed   By: Oneil Officer M.D.   On: 03/28/2022 16:36     Objective:  VS:  HT:    WT:   BMI:     BP:117/74  HR:93bpm  TEMP: ( )  RESP:  Physical Exam Vitals and nursing note reviewed.  Constitutional:      General: She is not in acute distress.    Appearance: Normal appearance. She is not ill-appearing.  HENT:     Head: Normocephalic and atraumatic.     Right Ear: External ear normal.     Left Ear: External ear normal.  Eyes:     Extraocular Movements: Extraocular movements intact.  Cardiovascular:     Rate and  Rhythm: Normal rate.     Pulses: Normal pulses.  Pulmonary:     Effort: Pulmonary effort is normal. No respiratory distress.  Abdominal:     General: There is no distension.     Palpations: Abdomen is soft.  Musculoskeletal:        General: Tenderness present.     Cervical back: Neck supple.     Right lower leg: No edema.     Left lower leg: No edema.     Comments: Patient has good distal strength with no pain over the greater trochanters.  No clonus or focal weakness.  Skin:    Findings: No erythema, lesion or rash.  Neurological:     General: No focal deficit present.     Mental Status: She is alert and oriented to person, place, and time.     Sensory: No sensory deficit.     Motor: No weakness or abnormal muscle tone.      Coordination: Coordination normal.  Psychiatric:        Mood and Affect: Mood normal.        Behavior: Behavior normal.      Imaging: No results found.

## 2024-08-19 ENCOUNTER — Other Ambulatory Visit: Payer: Self-pay

## 2024-08-19 DIAGNOSIS — L409 Psoriasis, unspecified: Secondary | ICD-10-CM

## 2024-08-19 MED ORDER — BETAMETHASONE DIPROPIONATE 0.05 % EX OINT
TOPICAL_OINTMENT | CUTANEOUS | 0 refills | Status: AC
Start: 2024-08-19 — End: ?

## 2024-08-26 ENCOUNTER — Encounter: Payer: Self-pay | Admitting: Radiology

## 2024-11-10 ENCOUNTER — Other Ambulatory Visit: Payer: Self-pay | Admitting: Dermatology

## 2025-02-24 ENCOUNTER — Ambulatory Visit: Admitting: Family Medicine

## 2025-03-04 ENCOUNTER — Ambulatory Visit: Admitting: Dermatology
